# Patient Record
Sex: Male | Born: 1940
Health system: Southern US, Community
[De-identification: ages and names within clinical notes are randomized; demographics above are authoritative.]

## PROBLEM LIST (undated history)

## (undated) DIAGNOSIS — M1990 Unspecified osteoarthritis, unspecified site: Secondary | ICD-10-CM

## (undated) DIAGNOSIS — T4145XA Adverse effect of unspecified anesthetic, initial encounter: Secondary | ICD-10-CM

## (undated) DIAGNOSIS — H532 Diplopia: Secondary | ICD-10-CM

## (undated) DIAGNOSIS — N400 Enlarged prostate without lower urinary tract symptoms: Secondary | ICD-10-CM

## (undated) DIAGNOSIS — R310 Gross hematuria: Secondary | ICD-10-CM

## (undated) DIAGNOSIS — D6949 Other primary thrombocytopenia: Secondary | ICD-10-CM

## (undated) DIAGNOSIS — C4491 Basal cell carcinoma of skin, unspecified: Secondary | ICD-10-CM

## (undated) DIAGNOSIS — T8859XA Other complications of anesthesia, initial encounter: Secondary | ICD-10-CM

## (undated) DIAGNOSIS — E785 Hyperlipidemia, unspecified: Secondary | ICD-10-CM

## (undated) DIAGNOSIS — K625 Hemorrhage of anus and rectum: Secondary | ICD-10-CM

## (undated) DIAGNOSIS — K8020 Calculus of gallbladder without cholecystitis without obstruction: Secondary | ICD-10-CM

## (undated) HISTORY — DX: Calculus of gallbladder without cholecystitis without obstruction: K80.20

## (undated) HISTORY — DX: Hyperlipidemia, unspecified: E78.5

## (undated) HISTORY — DX: Gross hematuria: R31.0

## (undated) HISTORY — PX: CYSTOSCOPY: SUR368

---

## 2002-11-05 HISTORY — PX: COLONOSCOPY: SHX174

## 2004-12-25 ENCOUNTER — Ambulatory Visit: Payer: Self-pay | Admitting: Family Medicine

## 2005-01-21 ENCOUNTER — Ambulatory Visit: Payer: Self-pay | Admitting: Family Medicine

## 2005-01-22 ENCOUNTER — Encounter: Admission: RE | Admit: 2005-01-22 | Discharge: 2005-01-22 | Payer: Self-pay | Admitting: Family Medicine

## 2005-01-27 ENCOUNTER — Ambulatory Visit: Payer: Self-pay

## 2005-01-27 ENCOUNTER — Ambulatory Visit: Payer: Self-pay | Admitting: Family Medicine

## 2005-04-09 ENCOUNTER — Ambulatory Visit: Payer: Self-pay | Admitting: Family Medicine

## 2005-07-13 ENCOUNTER — Ambulatory Visit: Payer: Self-pay | Admitting: Family Medicine

## 2006-09-21 ENCOUNTER — Ambulatory Visit: Payer: Self-pay | Admitting: Family Medicine

## 2007-04-21 ENCOUNTER — Ambulatory Visit: Payer: Self-pay | Admitting: Internal Medicine

## 2007-07-25 ENCOUNTER — Ambulatory Visit: Payer: Self-pay | Admitting: Family Medicine

## 2007-07-25 DIAGNOSIS — R29898 Other symptoms and signs involving the musculoskeletal system: Secondary | ICD-10-CM | POA: Insufficient documentation

## 2007-07-25 DIAGNOSIS — E785 Hyperlipidemia, unspecified: Secondary | ICD-10-CM

## 2007-07-25 DIAGNOSIS — Z8669 Personal history of other diseases of the nervous system and sense organs: Secondary | ICD-10-CM | POA: Insufficient documentation

## 2007-07-25 DIAGNOSIS — H6120 Impacted cerumen, unspecified ear: Secondary | ICD-10-CM | POA: Insufficient documentation

## 2008-07-16 ENCOUNTER — Ambulatory Visit: Payer: Self-pay | Admitting: Family Medicine

## 2008-08-19 ENCOUNTER — Ambulatory Visit: Payer: Self-pay | Admitting: Family Medicine

## 2008-08-19 DIAGNOSIS — S2341XA Sprain of ribs, initial encounter: Secondary | ICD-10-CM

## 2008-11-28 ENCOUNTER — Ambulatory Visit: Payer: Self-pay | Admitting: Family Medicine

## 2008-11-28 ENCOUNTER — Telehealth: Payer: Self-pay | Admitting: Family Medicine

## 2008-11-28 DIAGNOSIS — J019 Acute sinusitis, unspecified: Secondary | ICD-10-CM

## 2009-11-04 ENCOUNTER — Ambulatory Visit: Payer: Self-pay | Admitting: Family Medicine

## 2010-11-03 NOTE — Assessment & Plan Note (Signed)
Summary: ear flush both ears/cjr   Vital Signs:  Patient profile:   69 year old male Height:      69 inches Weight:      192 pounds BMI:     28.46 Temp:     98.2 degrees F oral BP sitting:   124 / 88  (left arm) Cuff size:   large  Vitals Entered By: Alfred Levins, CMA (November 04, 2009 1:18 PM) CC: ear clogged   History of Present Illness: Here to have his ears cleaned out again. he has decreased hearing on both sides, no pain.   Current Medications (verified): 1)  None  Allergies (verified): 1)  ! Penicillin  Past History:  Past Medical History: Reviewed history from 07/25/2007 and no changes required. Hyperlipidemia  Review of Systems  The patient denies anorexia, fever, weight loss, weight gain, vision loss, hoarseness, chest pain, syncope, dyspnea on exertion, peripheral edema, prolonged cough, headaches, hemoptysis, abdominal pain, melena, hematochezia, severe indigestion/heartburn, hematuria, incontinence, genital sores, muscle weakness, suspicious skin lesions, transient blindness, difficulty walking, depression, unusual weight change, abnormal bleeding, enlarged lymph nodes, angioedema, breast masses, and testicular masses.    Physical Exam  General:  Well-developed,well-nourished,in no acute distress; alert,appropriate and cooperative throughout examination Ears:  both canals impacted with cerumen   Impression & Recommendations:  Problem # 1:  CERUMEN IMPACTION (ICD-380.4)  Patient Instructions: 1)  the right ear was irrigated clear with water, and the left ear canal was cleared using a speculum.  2)  Please schedule a follow-up appointment as needed .

## 2010-12-25 ENCOUNTER — Encounter: Payer: Self-pay | Admitting: Family Medicine

## 2010-12-28 ENCOUNTER — Encounter: Payer: Self-pay | Admitting: Family Medicine

## 2010-12-28 ENCOUNTER — Ambulatory Visit (INDEPENDENT_AMBULATORY_CARE_PROVIDER_SITE_OTHER): Payer: Medicare Other | Admitting: Family Medicine

## 2010-12-28 VITALS — BP 120/82 | HR 66 | Temp 98.7°F | Wt 188.0 lb

## 2010-12-28 DIAGNOSIS — H6120 Impacted cerumen, unspecified ear: Secondary | ICD-10-CM

## 2010-12-28 NOTE — Progress Notes (Signed)
  Subjective:    Patient ID: Peter [place], male    DOB: [birth year], 70 y.o.   MRN: [id number]  HPI Here for several weeks of decreased bilateral hearing. No pain. He has to have his ears cleaned out about once a year.    Review of Systems  Constitutional: Negative.   HENT: Positive for hearing loss. Negative for ear pain, congestion, tinnitus and ear discharge.        Objective:   Physical Exam  Constitutional: He appears well-developed and well-nourished.  HENT:       Both ear canals are full of cerumen          Assessment & Plan:  Both canals were irrigated clear with water.

## 2011-01-14 ENCOUNTER — Other Ambulatory Visit (INDEPENDENT_AMBULATORY_CARE_PROVIDER_SITE_OTHER): Payer: Medicare Other

## 2011-01-14 DIAGNOSIS — Z0000 Encounter for general adult medical examination without abnormal findings: Secondary | ICD-10-CM

## 2011-01-14 DIAGNOSIS — E785 Hyperlipidemia, unspecified: Secondary | ICD-10-CM

## 2011-01-14 DIAGNOSIS — Z125 Encounter for screening for malignant neoplasm of prostate: Secondary | ICD-10-CM

## 2011-01-14 LAB — POCT URINALYSIS DIPSTICK
Glucose, UA: NEGATIVE
Leukocytes, UA: NEGATIVE
Nitrite, UA: NEGATIVE
Protein, UA: NEGATIVE
Spec Grav, UA: 1.015

## 2011-01-14 LAB — CBC WITH DIFFERENTIAL/PLATELET
Eosinophils Absolute: 0.1 10*3/uL (ref 0.0–0.7)
Eosinophils Relative: 2 % (ref 0.0–5.0)
Hemoglobin: 13.9 g/dL (ref 13.0–17.0)
Lymphs Abs: 2 10*3/uL (ref 0.7–4.0)
MCHC: 34.2 g/dL (ref 30.0–36.0)
Monocytes Relative: 9.6 % (ref 3.0–12.0)
Neutro Abs: 2.6 10*3/uL (ref 1.4–7.7)
WBC: 5.2 10*3/uL (ref 4.5–10.5)

## 2011-01-14 LAB — BASIC METABOLIC PANEL
CO2: 28 mEq/L (ref 19–32)
Calcium: 8.8 mg/dL (ref 8.4–10.5)
Creatinine, Ser: 1.1 mg/dL (ref 0.4–1.5)
Potassium: 4.5 mEq/L (ref 3.5–5.1)
Sodium: 137 mEq/L (ref 135–145)

## 2011-01-14 LAB — HEPATIC FUNCTION PANEL
ALT: 16 U/L (ref 0–53)
AST: 18 U/L (ref 0–37)
Albumin: 3.9 g/dL (ref 3.5–5.2)
Bilirubin, Direct: 0.1 mg/dL (ref 0.0–0.3)
Total Protein: 6.7 g/dL (ref 6.0–8.3)

## 2011-01-14 LAB — LDL CHOLESTEROL, DIRECT: Direct LDL: 181.1 mg/dL

## 2011-01-14 LAB — LIPID PANEL
Cholesterol: 252 mg/dL — ABNORMAL HIGH (ref 0–200)
Total CHOL/HDL Ratio: 5
VLDL: 14.4 mg/dL (ref 0.0–40.0)

## 2011-01-19 ENCOUNTER — Telehealth: Payer: Self-pay

## 2011-01-19 NOTE — Telephone Encounter (Signed)
Message copied by Madison Hickman on Tue Jan 19, 2011  8:29 AM ------      Message from: Dwaine Deter      Created: Tue Jan 19, 2011  6:14 AM       Normal except his cholesterol is very high. We will discuss at the cpx

## 2011-01-19 NOTE — Telephone Encounter (Signed)
Pt aware.

## 2011-01-21 ENCOUNTER — Encounter: Payer: Self-pay | Admitting: Family Medicine

## 2011-01-21 ENCOUNTER — Ambulatory Visit (INDEPENDENT_AMBULATORY_CARE_PROVIDER_SITE_OTHER): Payer: Medicare Other | Admitting: Family Medicine

## 2011-01-21 VITALS — BP 130/80 | Ht 67.0 in | Wt 186.0 lb

## 2011-01-21 DIAGNOSIS — Z0000 Encounter for general adult medical examination without abnormal findings: Secondary | ICD-10-CM

## 2011-01-21 MED ORDER — EZETIMIBE 10 MG PO TABS: 10.0000 mg | ORAL_TABLET | Freq: Every day | ORAL | Status: DC

## 2011-01-21 NOTE — Progress Notes (Signed)
  Subjective:    Patient ID: Peter [place], male    DOB: 03/04/1941, 70 y.o.   MRN: [id number]  HPI 70 yr old male for a cpx. He feels fine and has no complaints.    Review of Systems  Constitutional: Negative.   HENT: Negative.   Eyes: Negative.   Respiratory: Negative.   Cardiovascular: Negative.   Gastrointestinal: Negative.   Genitourinary: Negative.   Musculoskeletal: Negative.   Skin: Negative.   Neurological: Negative.   Hematological: Negative.   Psychiatric/Behavioral: Negative.        Objective:   Physical Exam  Constitutional: He is oriented to person, place, and time. He appears well-developed and well-nourished. No distress.  HENT:  Head: Normocephalic and atraumatic.  Right Ear: External ear normal.  Left Ear: External ear normal.  Nose: Nose normal.  Mouth/Throat: Oropharynx is clear and moist. No oropharyngeal exudate.  Eyes: Conjunctivae and EOM are normal. Pupils are equal, round, and reactive to light. Right eye exhibits no discharge. Left eye exhibits no discharge. No scleral icterus.  Neck: Neck supple. No JVD present. No tracheal deviation present. No thyromegaly present.  Cardiovascular: Normal rate, regular rhythm, normal heart sounds and intact distal pulses.  Exam reveals no gallop and no friction rub.   No murmur heard.      EKG normal   Pulmonary/Chest: Effort normal and breath sounds normal. No respiratory distress. He has no wheezes. He has no rales. He exhibits no tenderness.  Abdominal: Soft. Bowel sounds are normal. He exhibits no distension and no mass. There is no tenderness. There is no rebound and no guarding.  Genitourinary: Rectum normal, prostate normal and penis normal. Guaiac negative stool. No penile tenderness.  Musculoskeletal: Normal range of motion. He exhibits no edema and no tenderness.  Lymphadenopathy:    He has no cervical adenopathy.  Neurological: He is alert and oriented to person, place, and time. He has normal  reflexes. No cranial nerve deficit. He exhibits normal muscle tone. Coordination normal.  Skin: Skin is warm and dry. No rash noted. He is not diaphoretic. No erythema. No pallor.  Psychiatric: He has a normal mood and affect. His behavior is normal. Judgment and thought content normal.          Assessment & Plan:  We discussed his diet and the nedd to get the LDL down. Try Zetia and recheck labs in 90 days. Set up a colonoscopy.

## 2011-03-04 ENCOUNTER — Ambulatory Visit (AMBULATORY_SURGERY_CENTER): Payer: Medicare Other | Admitting: *Deleted

## 2011-03-04 ENCOUNTER — Telehealth: Payer: Self-pay | Admitting: *Deleted

## 2011-03-04 VITALS — Ht 69.0 in | Wt 186.8 lb

## 2011-03-04 DIAGNOSIS — Z1211 Encounter for screening for malignant neoplasm of colon: Secondary | ICD-10-CM

## 2011-03-04 MED ORDER — PEG-KCL-NACL-NASULF-NA ASC-C 100 G PO SOLR: ORAL | Status: DC

## 2011-03-04 NOTE — Telephone Encounter (Signed)
Pt thinks he had colonoscopy in 2004. Says it was normal. Pt is not having problems and no family history of colon CA.  Cannot remember MD's name who performed procedure or where it was performed.  Only remembers it was in [place], [place].  He will check his records at home and call back w/ Information.  Pt will ask to speak to Sophia or Sheri. Release of information signed for when pt calls back w/ MD's name and given to Sophia. Kathy Smith  

## 2011-03-04 NOTE — Progress Notes (Signed)
Pt thinks he had colonoscopy in 2004. Says it was normal. Pt is not having problems and no family history of colon CA.  Cannot remember MD's name who performed procedure or where it was performed.  Only remembers it was in [place], [place].  He will check his records at home and call back w/ Information.  Pt will ask to speak to [facility] or Sheri. Release of information signed for when pt calls back w/ MD's name and given to [facility]. Ezra Sites

## 2011-03-04 NOTE — Telephone Encounter (Signed)
Take with patient and information obtained. Records release faxed to Dr. Jodi Marble [facility], [place]).

## 2011-03-11 ENCOUNTER — Telehealth: Payer: Self-pay | Admitting: Internal Medicine

## 2011-03-11 NOTE — Telephone Encounter (Signed)
Patient called wanting to know if he still needs the Colonoscopy on Wed. Patient was told that Dr. Leone Payor need to review records first. Records release re-faxed to Dr. Jodi Marble for records. Patient was told that I will call him before Wed.

## 2011-03-15 ENCOUNTER — Telehealth: Payer: Self-pay | Admitting: Gastroenterology

## 2011-03-15 ENCOUNTER — Encounter: Payer: Self-pay | Admitting: Internal Medicine

## 2011-03-15 NOTE — Telephone Encounter (Signed)
Per Dr. Leone Payor, patient was told that his records were reviewed and that he was not due for a recall Colonoscopy until February 2014, since he had polyps but was not pre-cancerous. Colonoscopy for March 18, 2011 was cancelled and recall put in for Fed 2014.

## 2011-03-18 ENCOUNTER — Other Ambulatory Visit: Payer: Medicare Other | Admitting: Internal Medicine

## 2011-05-18 ENCOUNTER — Other Ambulatory Visit (INDEPENDENT_AMBULATORY_CARE_PROVIDER_SITE_OTHER): Payer: Medicare Other

## 2011-05-18 DIAGNOSIS — E785 Hyperlipidemia, unspecified: Secondary | ICD-10-CM

## 2011-05-18 LAB — LIPID PANEL
Cholesterol: 194 mg/dL (ref 0–200)
LDL Cholesterol: 121 mg/dL — ABNORMAL HIGH (ref 0–99)
Total CHOL/HDL Ratio: 3
Triglycerides: 63 mg/dL (ref 0.0–149.0)

## 2011-05-20 NOTE — Progress Notes (Signed)
Spoke with pt and gave results. 

## 2011-07-05 ENCOUNTER — Ambulatory Visit (INDEPENDENT_AMBULATORY_CARE_PROVIDER_SITE_OTHER): Payer: Medicare Other | Admitting: Family Medicine

## 2011-07-05 ENCOUNTER — Encounter: Payer: Self-pay | Admitting: Family Medicine

## 2011-07-05 VITALS — BP 120/80 | HR 73 | Temp 98.6°F | Wt 189.0 lb

## 2011-07-05 DIAGNOSIS — J329 Chronic sinusitis, unspecified: Secondary | ICD-10-CM

## 2011-07-05 DIAGNOSIS — J3489 Other specified disorders of nose and nasal sinuses: Secondary | ICD-10-CM

## 2011-07-05 DIAGNOSIS — R05 Cough: Secondary | ICD-10-CM

## 2011-07-05 MED ORDER — AZITHROMYCIN 250 MG PO TABS: ORAL_TABLET | ORAL | Status: AC

## 2011-07-05 NOTE — Progress Notes (Signed)
  Subjective:    Patient ID: Peter [place], male    DOB: April 26, 1941, 70 y.o.   MRN: [id number]  HPI Here for 2 weeks of intermittent fevers, sinus pressure, HAS, PND, and ST. No cough.   Review of Systems  Constitutional: Negative.   HENT: Positive for congestion, postnasal drip and sinus pressure.   Eyes: Negative.   Respiratory: Positive for cough.        Objective:   Physical Exam  Constitutional: He appears well-developed and well-nourished.  HENT:  Right Ear: External ear normal.  Left Ear: External ear normal.  Nose: Nose normal.  Mouth/Throat: Oropharynx is clear and moist. No oropharyngeal exudate.  Eyes: Conjunctivae are normal. Pupils are equal, round, and reactive to light.  Neck: No thyromegaly present.  Pulmonary/Chest: Effort normal and breath sounds normal.  Lymphadenopathy:    He has no cervical adenopathy.          Assessment & Plan:  Add Mucinex.

## 2011-10-26 DIAGNOSIS — H524 Presbyopia: Secondary | ICD-10-CM | POA: Diagnosis not present

## 2011-10-26 DIAGNOSIS — H2510 Age-related nuclear cataract, unspecified eye: Secondary | ICD-10-CM | POA: Diagnosis not present

## 2012-01-10 ENCOUNTER — Encounter: Payer: Self-pay | Admitting: Family Medicine

## 2012-01-10 ENCOUNTER — Ambulatory Visit (INDEPENDENT_AMBULATORY_CARE_PROVIDER_SITE_OTHER): Payer: Medicare Other | Admitting: Family Medicine

## 2012-01-10 VITALS — BP 128/74 | HR 72 | Temp 98.1°F | Wt 191.0 lb

## 2012-01-10 DIAGNOSIS — E785 Hyperlipidemia, unspecified: Secondary | ICD-10-CM | POA: Diagnosis not present

## 2012-01-10 DIAGNOSIS — H6120 Impacted cerumen, unspecified ear: Secondary | ICD-10-CM | POA: Diagnosis not present

## 2012-01-10 NOTE — Progress Notes (Signed)
  Subjective:    Patient ID: Peter [place], male    DOB: [birth year], 71 y.o.   MRN: [id number]  HPI Here for both ears feeling stopped up. No pain. He has to get them flushed out about once a year. Also he stopped taking Zetia 2 months ago because of diffuse joint pains. Within 2 weeks the joint pains resolved, so he wants to stay off the med.    Review of Systems  Constitutional: Negative.   HENT: Positive for hearing loss. Negative for ear pain and tinnitus.   Musculoskeletal: Positive for arthralgias.       Objective:   Physical Exam  Constitutional: He appears well-developed and well-nourished.  HENT:  Nose: Nose normal.  Mouth/Throat: Oropharynx is clear and moist. No oropharyngeal exudate.       Both ear canals are full of cerumen   Neck: No thyromegaly present.  Pulmonary/Chest: Effort normal and breath sounds normal.  Lymphadenopathy:    He has no cervical adenopathy.          Assessment & Plan:  Both ears are irrigated clear with water. We will stay off Zetia and check a lipid level in another month

## 2012-07-28 ENCOUNTER — Encounter: Payer: Self-pay | Admitting: Family Medicine

## 2012-07-28 ENCOUNTER — Ambulatory Visit (INDEPENDENT_AMBULATORY_CARE_PROVIDER_SITE_OTHER): Payer: Medicare Other | Admitting: Family Medicine

## 2012-07-28 VITALS — BP 120/70 | Temp 98.2°F | Wt 187.0 lb

## 2012-07-28 DIAGNOSIS — H6120 Impacted cerumen, unspecified ear: Secondary | ICD-10-CM | POA: Diagnosis not present

## 2012-07-28 DIAGNOSIS — J329 Chronic sinusitis, unspecified: Secondary | ICD-10-CM | POA: Diagnosis not present

## 2012-07-28 MED ORDER — AZITHROMYCIN 250 MG PO TABS: ORAL_TABLET | ORAL | Status: DC

## 2012-07-28 NOTE — Addendum Note (Signed)
Addended by: Gershon Crane A on: 07/28/2012 05:50 PM   Modules accepted: Orders

## 2012-07-28 NOTE — Progress Notes (Signed)
  Subjective:    Patient ID: Peter [place], male    DOB: [birth year], 71 y.o.   MRN: [id number]  HPI Here for one week of stuffy head, PND, ST, and a dry cough. No fever. He has also noticed some decreased hearing for awhile.    Review of Systems  Constitutional: Negative.   HENT: Positive for hearing loss, congestion and postnasal drip. Negative for ear pain.   Eyes: Negative.   Respiratory: Positive for cough.        Objective:   Physical Exam  Constitutional: He appears well-nourished.  HENT:  Nose: Nose normal.  Mouth/Throat: Oropharynx is clear and moist. No oropharyngeal exudate.       Both ears are full of cerumen   Eyes: Conjunctivae normal are normal.  Pulmonary/Chest: Effort normal and breath sounds normal.  Lymphadenopathy:    He has no cervical adenopathy.          Assessment & Plan:  Given a Zpack. The ears were irrigated clear with water.

## 2012-07-28 NOTE — Progress Notes (Signed)
  Subjective:    Patient ID: Peter [place], male    DOB: [birth year], 71 y.o.   MRN: [id number]  HPI    Review of Systems     Objective:   Physical Exam        Assessment & Plan:  Actually after repeated attempts to clear his ears, we were unsuccessful at this. We will refer him to ENT for the cerumen impactions.

## 2012-08-03 DIAGNOSIS — H902 Conductive hearing loss, unspecified: Secondary | ICD-10-CM | POA: Diagnosis not present

## 2012-08-03 DIAGNOSIS — H6120 Impacted cerumen, unspecified ear: Secondary | ICD-10-CM | POA: Diagnosis not present

## 2012-08-07 ENCOUNTER — Telehealth: Payer: Self-pay | Admitting: Family Medicine

## 2012-08-07 DIAGNOSIS — Z23 Encounter for immunization: Secondary | ICD-10-CM | POA: Diagnosis not present

## 2012-08-07 DIAGNOSIS — S61409A Unspecified open wound of unspecified hand, initial encounter: Secondary | ICD-10-CM | POA: Diagnosis not present

## 2012-08-07 NOTE — Telephone Encounter (Signed)
Pt notified per Dr. Claris Che instructions to report to an urgent care facility for reported injury.

## 2012-08-07 NOTE — Telephone Encounter (Signed)
Caller: Edouard/Patient; Patient Name: Peter [place]; PCP: Gershon Crane [facility], [place]); Best Callback Phone Number: [phone]; Reason for call: Cuts, Scrapes, Abrasions Pt cut finger at 1015 with an Exacto knife, between thumb and forefinger of right hand. Bleeding stopped easily (less than 10 min) but there is a 1 1/2 inch laceration that "gapes open with movement". Emergent sx of Abreasions, Lacerations, Puncture Wounds: Wound is non ro near a joint AND gapes when joint is bent. Spoke with Doy Hutching; to consult Dr. Abran Cantor and call pt back, asap.

## 2012-09-14 ENCOUNTER — Ambulatory Visit (INDEPENDENT_AMBULATORY_CARE_PROVIDER_SITE_OTHER): Payer: Medicare Other | Admitting: Internal Medicine

## 2012-09-14 ENCOUNTER — Encounter: Payer: Self-pay | Admitting: Internal Medicine

## 2012-09-14 VITALS — BP 140/88 | Temp 98.1°F | Wt 185.0 lb

## 2012-09-14 DIAGNOSIS — B029 Zoster without complications: Secondary | ICD-10-CM

## 2012-09-14 MED ORDER — VALACYCLOVIR HCL 1 G PO TABS: 1000.0000 mg | ORAL_TABLET | Freq: Two times a day (BID) | ORAL | Status: DC

## 2012-09-14 NOTE — Assessment & Plan Note (Signed)
71 year old white male with new onset rash on left flank/abdomen consistent with herpes zoster. Treat with Valtrex 1000 mg twice daily for 10 days. Follow up with primary care physician within 2 weeks. Patient advised to call office if he develops worsening rash or significant pain.

## 2012-09-14 NOTE — Patient Instructions (Addendum)
Please follow up with Dr. Clent Ridges within 2 weeks Call our office if your rash gets worse or you develop significant pain

## 2012-09-14 NOTE — Progress Notes (Signed)
  Subjective:    Patient ID: Peter [place], male    DOB: January 23, 1941, 71 y.o.   MRN: [id number]  HPI  71 year old white male with history of hyperlipidemia complains of new onset rash that he noticed this morning. His rash is located on his left side of his abdomen/flank. He "knows rash is there" but denies any severe pain or burning sensation.  Patient noted to have chickenpox as a child. Review of Systems Negative for fever or chills  Past Medical History  Diagnosis Date  . Hyperlipidemia     History   Social History  . Marital Status: Married    Spouse Name: N/A    Number of Children: N/A  . Years of Education: N/A   Occupational History  . Not on file.   Social History Main Topics  . Smoking status: Never Smoker   . Smokeless tobacco: Never Used  . Alcohol Use: No  . Drug Use: No  . Sexually Active: Not on file   Other Topics Concern  . Not on file   Social History Narrative  . No narrative on file    Past Surgical History  Procedure Date  . Colonoscopy 11/05/2002    Internal hemorrhoids, otherwise normal (diminutive hyperplastic polyp - Dr. Yaakov Guthrie, [place]    Family History  Problem Relation Age of Onset  . Ovarian cancer    . Heart disease      Allergies  Allergen Reactions  . Penicillins     REACTION: anaphylaxis    Current Outpatient Prescriptions on File Prior to Visit  Medication Sig Dispense Refill  . Multiple Vitamin (MULTIVITAMIN) tablet Take 1 tablet by mouth daily.          BP 140/88  Temp 98.1 F (36.7 C) (Oral)  Wt 185 lb (83.915 kg)       Objective:   Physical Exam  Constitutional: He is oriented to person, place, and time. He appears well-developed and well-nourished.  Cardiovascular: Normal rate, regular rhythm and normal heart sounds.   Pulmonary/Chest: Effort normal and breath sounds normal. He has no wheezes.  Neurological: He is alert and oriented to person, place, and time.  Skin:       Vesicular erythematous eruption  from left back, flank and  abdomen  Psychiatric: He has a normal mood and affect. His behavior is normal.          Assessment & Plan:

## 2012-09-28 ENCOUNTER — Ambulatory Visit (INDEPENDENT_AMBULATORY_CARE_PROVIDER_SITE_OTHER): Payer: Medicare Other | Admitting: Family Medicine

## 2012-09-28 ENCOUNTER — Encounter: Payer: Self-pay | Admitting: Family Medicine

## 2012-09-28 VITALS — BP 118/72 | HR 75 | Temp 98.0°F | Ht 69.0 in | Wt 186.0 lb

## 2012-09-28 DIAGNOSIS — B029 Zoster without complications: Secondary | ICD-10-CM

## 2012-09-28 NOTE — Progress Notes (Signed)
  Subjective:    Patient ID: Peter [place], male    DOB: [birth year], 71 y.o.   MRN: [id number]  HPI Here to follow up on shingles on the left flank. He was seen on 09-14-12 for this and he took a course of Valtrex. Now he feels much better with some residual burning pains. The rash is fading away.    Review of Systems  Constitutional: Negative.   Respiratory: Negative.   Cardiovascular: Negative.        Objective:   Physical Exam  Constitutional: He appears well-developed and well-nourished.  Cardiovascular: Normal rate, regular rhythm, normal heart sounds and intact distal pulses.   Pulmonary/Chest: Effort normal and breath sounds normal.  Skin:       Fading rash on the left flank from the middle back around to the sternum           Assessment & Plan:  Resolving shingles. We will plan on giving him the Zostavax shot in 3 months

## 2012-11-20 ENCOUNTER — Encounter: Payer: Self-pay | Admitting: Internal Medicine

## 2013-01-25 DIAGNOSIS — H902 Conductive hearing loss, unspecified: Secondary | ICD-10-CM | POA: Diagnosis not present

## 2013-01-25 DIAGNOSIS — H6120 Impacted cerumen, unspecified ear: Secondary | ICD-10-CM | POA: Diagnosis not present

## 2013-06-25 ENCOUNTER — Encounter: Payer: Self-pay | Admitting: Internal Medicine

## 2013-10-30 DIAGNOSIS — H6120 Impacted cerumen, unspecified ear: Secondary | ICD-10-CM | POA: Diagnosis not present

## 2013-10-30 DIAGNOSIS — H902 Conductive hearing loss, unspecified: Secondary | ICD-10-CM | POA: Diagnosis not present

## 2014-04-24 DIAGNOSIS — T6391XA Toxic effect of contact with unspecified venomous animal, accidental (unintentional), initial encounter: Secondary | ICD-10-CM | POA: Diagnosis not present

## 2014-04-24 DIAGNOSIS — Z880 Allergy status to penicillin: Secondary | ICD-10-CM | POA: Diagnosis not present

## 2014-04-24 DIAGNOSIS — T7840XA Allergy, unspecified, initial encounter: Secondary | ICD-10-CM | POA: Diagnosis not present

## 2014-04-26 ENCOUNTER — Ambulatory Visit (INDEPENDENT_AMBULATORY_CARE_PROVIDER_SITE_OTHER): Payer: Medicare Other | Admitting: Family Medicine

## 2014-04-26 ENCOUNTER — Encounter: Payer: Self-pay | Admitting: Family Medicine

## 2014-04-26 VITALS — BP 135/62 | HR 75 | Temp 98.7°F | Ht 69.0 in | Wt 189.0 lb

## 2014-04-26 DIAGNOSIS — T6591XS Toxic effect of unspecified substance, accidental (unintentional), sequela: Secondary | ICD-10-CM | POA: Diagnosis not present

## 2014-04-26 DIAGNOSIS — L509 Urticaria, unspecified: Secondary | ICD-10-CM

## 2014-04-26 DIAGNOSIS — T63443S Toxic effect of venom of bees, assault, sequela: Secondary | ICD-10-CM

## 2014-04-26 NOTE — Progress Notes (Signed)
Pre visit review using our clinic review tool, if applicable. No additional management support is needed unless otherwise documented below in the visit note. 

## 2014-04-26 NOTE — Progress Notes (Signed)
   Subjective:    Patient ID: Peter [place], male    DOB: [birth year], 73 y.o.   MRN: [id number]  HPI Here for hives that he thinks are a reaction to prednisone. He has taken this before with no problem. On 04-24-14 while working in his yard he was stung multiple times by yellow jackets on the arms and legs. This caused him to pass out twice at home but there was no tongue swelling, throat closing, SOB etc. His wife took him to the ER where he received IV Benadryl among other things. They put him on a prednisone taper and he took this up until this morning. Last night he developed itchy hives all over his body. Still no SOB etc.    Review of Systems  Constitutional: Negative.   HENT: Negative.   Respiratory: Negative.   Cardiovascular: Negative.   Skin: Positive for rash.  Neurological: Negative.        Objective:   Physical Exam  Constitutional: He is oriented to person, place, and time. He appears well-developed and well-nourished. No distress.  Cardiovascular: Normal rate, regular rhythm, normal heart sounds and intact distal pulses.   Pulmonary/Chest: Effort normal and breath sounds normal.  Neurological: He is alert and oriented to person, place, and time.  Skin:  Diffuse urticaria over the body           Assessment & Plan:  This does seem to be a reaction to the prednisone so he will stay of this. Take Zyrtec 10 mg bid and Zantac 150 mg bid until the hives clear. Recheck prn

## 2014-07-04 DIAGNOSIS — L821 Other seborrheic keratosis: Secondary | ICD-10-CM | POA: Diagnosis not present

## 2014-07-04 DIAGNOSIS — D1801 Hemangioma of skin and subcutaneous tissue: Secondary | ICD-10-CM | POA: Diagnosis not present

## 2014-07-04 DIAGNOSIS — C44519 Basal cell carcinoma of skin of other part of trunk: Secondary | ICD-10-CM | POA: Diagnosis not present

## 2014-07-04 DIAGNOSIS — D492 Neoplasm of unspecified behavior of bone, soft tissue, and skin: Secondary | ICD-10-CM | POA: Diagnosis not present

## 2014-07-23 DIAGNOSIS — C44519 Basal cell carcinoma of skin of other part of trunk: Secondary | ICD-10-CM | POA: Diagnosis not present

## 2014-08-15 DIAGNOSIS — H6123 Impacted cerumen, bilateral: Secondary | ICD-10-CM | POA: Diagnosis not present

## 2014-08-15 DIAGNOSIS — H6120 Impacted cerumen, unspecified ear: Secondary | ICD-10-CM | POA: Diagnosis not present

## 2014-08-15 DIAGNOSIS — H902 Conductive hearing loss, unspecified: Secondary | ICD-10-CM | POA: Diagnosis not present

## 2014-10-15 DIAGNOSIS — L821 Other seborrheic keratosis: Secondary | ICD-10-CM | POA: Diagnosis not present

## 2014-10-31 DIAGNOSIS — H01023 Squamous blepharitis right eye, unspecified eyelid: Secondary | ICD-10-CM | POA: Diagnosis not present

## 2014-10-31 DIAGNOSIS — H2513 Age-related nuclear cataract, bilateral: Secondary | ICD-10-CM | POA: Diagnosis not present

## 2014-10-31 DIAGNOSIS — H01029 Squamous blepharitis unspecified eye, unspecified eyelid: Secondary | ICD-10-CM | POA: Diagnosis not present

## 2015-01-08 ENCOUNTER — Encounter: Payer: Self-pay | Admitting: Family Medicine

## 2015-01-08 ENCOUNTER — Ambulatory Visit (INDEPENDENT_AMBULATORY_CARE_PROVIDER_SITE_OTHER): Payer: Medicare Other | Admitting: Family Medicine

## 2015-01-08 VITALS — BP 141/80 | HR 69 | Temp 98.8°F | Ht 69.0 in | Wt 188.0 lb

## 2015-01-08 DIAGNOSIS — J0100 Acute maxillary sinusitis, unspecified: Secondary | ICD-10-CM | POA: Diagnosis not present

## 2015-01-08 MED ORDER — AZITHROMYCIN 250 MG PO TABS: ORAL_TABLET | ORAL | Status: DC

## 2015-01-08 NOTE — Progress Notes (Signed)
Pre visit review using our clinic review tool, if applicable. No additional management support is needed unless otherwise documented below in the visit note. 

## 2015-01-08 NOTE — Progress Notes (Signed)
   Subjective:    Patient ID: Peter [place], male    DOB: July 22, 1941, 74 y.o.   MRN: [id number]  HPI Here for one week of sinus pressure, PND, ST, and a dry cough.   Review of Systems  Constitutional: Negative.   HENT: Positive for congestion, postnasal drip and sinus pressure.   Eyes: Negative.   Respiratory: Positive for cough.        Objective:   Physical Exam  Constitutional: He appears well-developed and well-nourished.  HENT:  Right Ear: External ear normal.  Left Ear: External ear normal.  Nose: Nose normal.  Mouth/Throat: Oropharynx is clear and moist.  Eyes: Conjunctivae are normal.  Pulmonary/Chest: Effort normal and breath sounds normal.  Lymphadenopathy:    He has no cervical adenopathy.          Assessment & Plan:  Add Mucinex

## 2015-02-19 DIAGNOSIS — H2510 Age-related nuclear cataract, unspecified eye: Secondary | ICD-10-CM | POA: Diagnosis not present

## 2015-02-19 DIAGNOSIS — H01029 Squamous blepharitis unspecified eye, unspecified eyelid: Secondary | ICD-10-CM | POA: Diagnosis not present

## 2015-02-19 DIAGNOSIS — H01023 Squamous blepharitis right eye, unspecified eyelid: Secondary | ICD-10-CM | POA: Diagnosis not present

## 2015-02-19 DIAGNOSIS — H532 Diplopia: Secondary | ICD-10-CM | POA: Diagnosis not present

## 2015-02-21 ENCOUNTER — Telehealth: Payer: Self-pay | Admitting: Family Medicine

## 2015-02-21 NOTE — Telephone Encounter (Signed)
Pt has been scheduled for monday

## 2015-02-21 NOTE — Telephone Encounter (Signed)
Pt saw his eye doctor about his his double vision. Eye doctor suggest a stroke lab panel. Pt would ike a call back - if he is not at home it is ok to leave a message. Would like to have labs drawn Monday if possible.

## 2015-02-21 NOTE — Telephone Encounter (Signed)
He needs an OV for this. I have not evaluated him for this problem

## 2015-02-24 ENCOUNTER — Ambulatory Visit: Payer: BLUE CROSS/BLUE SHIELD | Admitting: Family Medicine

## 2015-02-27 ENCOUNTER — Ambulatory Visit (INDEPENDENT_AMBULATORY_CARE_PROVIDER_SITE_OTHER): Payer: Medicare Other | Admitting: Family Medicine

## 2015-02-27 ENCOUNTER — Encounter: Payer: Self-pay | Admitting: Family Medicine

## 2015-02-27 VITALS — BP 122/69 | HR 73 | Temp 98.7°F | Ht 69.0 in | Wt 190.0 lb

## 2015-02-27 DIAGNOSIS — H532 Diplopia: Secondary | ICD-10-CM | POA: Diagnosis not present

## 2015-02-27 LAB — BASIC METABOLIC PANEL
BUN: 17 mg/dL (ref 6–23)
CO2: 31 meq/L (ref 19–32)
CREATININE: 1.2 mg/dL (ref 0.40–1.50)
Calcium: 9.6 mg/dL (ref 8.4–10.5)
Chloride: 103 mEq/L (ref 96–112)
GFR: 62.52 mL/min (ref >60.00)
Glucose, Bld: 91 mg/dL (ref 70–99)
POTASSIUM: 4.6 meq/L (ref 3.5–5.1)
SODIUM: 138 meq/L (ref 135–145)

## 2015-02-27 LAB — CBC WITH DIFFERENTIAL/PLATELET
BASOS ABS: 0.1 10*3/uL (ref 0.0–0.1)
Basophils Relative: 1 % (ref 0.0–3.0)
EOS ABS: 0.1 10*3/uL (ref 0.0–0.7)
EOS PCT: 1.9 % (ref 0.0–5.0)
HCT: 42.9 % (ref 39.0–52.0)
HEMOGLOBIN: 14.4 g/dL (ref 13.0–17.0)
LYMPHS ABS: 2.6 10*3/uL (ref 0.7–4.0)
LYMPHS PCT: 42.7 % (ref 12.0–46.0)
MCHC: 33.7 g/dL (ref 30.0–36.0)
MCV: 92.8 fl (ref 78.0–100.0)
MONO ABS: 0.7 10*3/uL (ref 0.1–1.0)
MONOS PCT: 11.6 % (ref 3.0–12.0)
Neutro Abs: 2.6 10*3/uL (ref 1.4–7.7)
Neutrophils Relative %: 42.8 % — ABNORMAL LOW (ref 43.0–77.0)
Platelets: 134 10*3/uL — ABNORMAL LOW (ref 150.0–400.0)
RBC: 4.62 Mil/uL (ref 4.22–5.81)
RDW: 14.4 % (ref 11.5–15.5)
WBC: 6.1 10*3/uL (ref 4.0–10.5)

## 2015-02-27 LAB — HEPATIC FUNCTION PANEL
ALBUMIN: 4.5 g/dL (ref 3.5–5.2)
ALT: 14 U/L (ref 0–53)
AST: 18 U/L (ref 0–37)
Alkaline Phosphatase: 53 U/L (ref 39–117)
BILIRUBIN DIRECT: 0.1 mg/dL (ref 0.0–0.3)
TOTAL PROTEIN: 7.5 g/dL (ref 6.0–8.3)
Total Bilirubin: 0.7 mg/dL (ref 0.2–1.2)

## 2015-02-27 LAB — VITAMIN B12: VITAMIN B 12: 318 pg/mL (ref 211–911)

## 2015-02-27 LAB — TSH: TSH: 1.56 u[IU]/mL (ref 0.35–4.50)

## 2015-02-27 NOTE — Progress Notes (Signed)
   Subjective:    Patient ID: Peter [place], male    DOB: [birth year], 74 y.o.   MRN: [id number]  HPI Here to discuss intermittent double vision. For the past few months he has had brief spells of double vision that only last about 30 seconds at a time. He is averaging 2-3 of these a week. They can occur at any time of day. No other sx, no blurred vision, no HA. No other neurologic deficits, no numbness or weakness. He saw his opththalmologist, Dr. Doree Fudge in [place], yesterday for a full eye exam and this was totally normal.    Review of Systems  Constitutional: Negative.   HENT: Negative.   Eyes: Positive for visual disturbance. Negative for pain.  Respiratory: Negative.   Cardiovascular: Negative.   Neurological: Negative.        Objective:   Physical Exam  Constitutional: He is oriented to person, place, and time. He appears well-developed and well-nourished.  HENT:  Head: Normocephalic and atraumatic.  Right Ear: External ear normal.  Left Ear: External ear normal.  Nose: Nose normal.  Mouth/Throat: Oropharynx is clear and moist.  Eyes: Conjunctivae and EOM are normal. Pupils are equal, round, and reactive to light.  Neck: Neck supple. No thyromegaly present.  Cardiovascular: Normal rate, regular rhythm, normal heart sounds and intact distal pulses.   No carotid bruits   Pulmonary/Chest: Effort normal and breath sounds normal.  Lymphadenopathy:    He has no cervical adenopathy.  Neurological: He is alert and oriented to person, place, and time. He has normal reflexes. No cranial nerve deficit. He exhibits normal muscle tone. Coordination normal.          Assessment & Plan:  Intermittent double vision which seems to be related to fatigue of some extraocular muscles. We will screen with a brain MRI and carotid dopplers. Get labs today. If these are unrevealing we will refer him to Neurology

## 2015-02-27 NOTE — Progress Notes (Signed)
Pre visit review using our clinic review tool, if applicable. No additional management support is needed unless otherwise documented below in the visit note. 

## 2015-03-05 ENCOUNTER — Encounter (HOSPITAL_COMMUNITY): Payer: BLUE CROSS/BLUE SHIELD

## 2015-03-11 ENCOUNTER — Ambulatory Visit
Admission: RE | Admit: 2015-03-11 | Discharge: 2015-03-11 | Disposition: A | Discharge status: Home / Self Care | Payer: Medicare Other | Source: Ambulatory Visit | Attending: Family Medicine | Admitting: Family Medicine

## 2015-03-11 DIAGNOSIS — H532 Diplopia: Secondary | ICD-10-CM | POA: Diagnosis not present

## 2015-03-11 MED ORDER — GADOBENATE DIMEGLUMINE 529 MG/ML IV SOLN: 17.0000 mL | Freq: Once | INTRAVENOUS | Status: AC | PRN

## 2015-03-12 ENCOUNTER — Ambulatory Visit (HOSPITAL_COMMUNITY): Payer: Medicare Other | Attending: Cardiology

## 2015-03-12 DIAGNOSIS — I6523 Occlusion and stenosis of bilateral carotid arteries: Secondary | ICD-10-CM | POA: Diagnosis not present

## 2015-03-12 DIAGNOSIS — H532 Diplopia: Secondary | ICD-10-CM | POA: Diagnosis not present

## 2015-04-08 DIAGNOSIS — H900 Conductive hearing loss, bilateral: Secondary | ICD-10-CM | POA: Diagnosis not present

## 2015-04-08 DIAGNOSIS — H6123 Impacted cerumen, bilateral: Secondary | ICD-10-CM | POA: Diagnosis not present

## 2015-06-24 DIAGNOSIS — H2513 Age-related nuclear cataract, bilateral: Secondary | ICD-10-CM | POA: Diagnosis not present

## 2015-06-24 DIAGNOSIS — H532 Diplopia: Secondary | ICD-10-CM | POA: Diagnosis not present

## 2015-06-24 DIAGNOSIS — H524 Presbyopia: Secondary | ICD-10-CM | POA: Diagnosis not present

## 2015-07-08 DIAGNOSIS — H5053 Vertical heterophoria: Secondary | ICD-10-CM | POA: Diagnosis not present

## 2015-07-14 DIAGNOSIS — L570 Actinic keratosis: Secondary | ICD-10-CM | POA: Diagnosis not present

## 2015-07-14 DIAGNOSIS — Z85828 Personal history of other malignant neoplasm of skin: Secondary | ICD-10-CM | POA: Diagnosis not present

## 2015-07-14 DIAGNOSIS — L821 Other seborrheic keratosis: Secondary | ICD-10-CM | POA: Diagnosis not present

## 2015-07-14 DIAGNOSIS — D485 Neoplasm of uncertain behavior of skin: Secondary | ICD-10-CM | POA: Diagnosis not present

## 2015-07-14 DIAGNOSIS — L820 Inflamed seborrheic keratosis: Secondary | ICD-10-CM | POA: Diagnosis not present

## 2015-07-14 DIAGNOSIS — D1801 Hemangioma of skin and subcutaneous tissue: Secondary | ICD-10-CM | POA: Diagnosis not present

## 2015-08-14 ENCOUNTER — Encounter: Payer: Self-pay | Admitting: Family Medicine

## 2015-08-14 ENCOUNTER — Ambulatory Visit (INDEPENDENT_AMBULATORY_CARE_PROVIDER_SITE_OTHER): Payer: Medicare Other | Admitting: Family Medicine

## 2015-08-14 VITALS — BP 164/79 | HR 81 | Temp 99.5°F | Ht 69.0 in | Wt 186.0 lb

## 2015-08-14 DIAGNOSIS — J019 Acute sinusitis, unspecified: Secondary | ICD-10-CM | POA: Diagnosis not present

## 2015-08-14 MED ORDER — AZITHROMYCIN 250 MG PO TABS: ORAL_TABLET | ORAL | Status: DC

## 2015-08-14 NOTE — Progress Notes (Signed)
   Subjective:    Patient ID: Peter [place], male    DOB: 10/02/1941, 74 y.o.   MRN: [id number]  HPI Here for 3 days of sinus pressure, PND, HA, ST, and a dry cough. No fever.    Review of Systems  Constitutional: Negative.   HENT: Positive for congestion, postnasal drip, sinus pressure and sore throat. Negative for ear pain.   Eyes: Negative.   Respiratory: Positive for cough.        Objective:   Physical Exam  Constitutional: He appears well-developed and well-nourished. No distress.  HENT:  Right Ear: External ear normal.  Left Ear: External ear normal.  Nose: Nose normal.  Mouth/Throat: Oropharynx is clear and moist.  Eyes: Conjunctivae are normal.  Neck: No thyromegaly present.  Cardiovascular: Normal rate, regular rhythm, normal heart sounds and intact distal pulses.   Pulmonary/Chest: Effort normal and breath sounds normal.  Lymphadenopathy:    He has no cervical adenopathy.          Assessment & Plan:  Sinusitis, treatr with a Zpack.

## 2015-08-14 NOTE — Progress Notes (Signed)
Pre visit review using our clinic review tool, if applicable. No additional management support is needed unless otherwise documented below in the visit note. 

## 2015-09-04 DIAGNOSIS — N400 Enlarged prostate without lower urinary tract symptoms: Secondary | ICD-10-CM

## 2015-09-04 DIAGNOSIS — R310 Gross hematuria: Secondary | ICD-10-CM

## 2015-09-04 DIAGNOSIS — K8020 Calculus of gallbladder without cholecystitis without obstruction: Secondary | ICD-10-CM

## 2015-09-04 HISTORY — DX: Gross hematuria: R31.0

## 2015-09-04 HISTORY — DX: Calculus of gallbladder without cholecystitis without obstruction: K80.20

## 2015-09-04 HISTORY — DX: Benign prostatic hyperplasia without lower urinary tract symptoms: N40.0

## 2015-09-19 ENCOUNTER — Ambulatory Visit (INDEPENDENT_AMBULATORY_CARE_PROVIDER_SITE_OTHER): Payer: Medicare Other | Admitting: Family Medicine

## 2015-09-19 ENCOUNTER — Encounter: Payer: Self-pay | Admitting: Family Medicine

## 2015-09-19 VITALS — BP 151/81 | HR 75 | Temp 98.1°F | Resp 16 | Ht 69.0 in | Wt 190.0 lb

## 2015-09-19 DIAGNOSIS — R361 Hematospermia: Secondary | ICD-10-CM

## 2015-09-19 DIAGNOSIS — R319 Hematuria, unspecified: Secondary | ICD-10-CM

## 2015-09-19 DIAGNOSIS — N410 Acute prostatitis: Secondary | ICD-10-CM

## 2015-09-19 LAB — POCT URINALYSIS DIPSTICK
BILIRUBIN UA: NEGATIVE
Blood, UA: NEGATIVE
GLUCOSE UA: NEGATIVE
KETONES UA: NEGATIVE
LEUKOCYTES UA: NEGATIVE
Nitrite, UA: NEGATIVE
PROTEIN UA: NEGATIVE
SPEC GRAV UA: 1.01
Urobilinogen, UA: 0.2
pH, UA: 5.5

## 2015-09-19 LAB — PSA: PSA: 1.49 ng/mL (ref 0.10–4.00)

## 2015-09-19 MED ORDER — LEVOFLOXACIN 500 MG PO TABS: 500.0000 mg | ORAL_TABLET | Freq: Every day | ORAL | Status: DC

## 2015-09-19 NOTE — Progress Notes (Signed)
OFFICE VISIT  09/19/2015   CC:  Chief Complaint  Patient presents with  . Hematuria    x 1 day   HPI:    Patient is a 74 y.o. Caucasian male who presents for noting dark brown color of his urine, thick-like per his words, noted at the end of urinating this morning.  Noted blood in his ejaculate weeks ago x 1, and it never happened again.  No dysuria, urinary frequency or urgency, or pain in suprapubic or rectal region.   Took a Z pack about 3 weeks ago for upper resp infection.   Never smoker.  Never worked around [employer].  No hx of kidney stones.  No flank/groin pain. Has mild R lower back pain today.  No hx of abnormal prostate ca screening.  Past Medical History  Diagnosis Date  . Hyperlipidemia     Past Surgical History  Procedure Laterality Date  . Colonoscopy  11/05/2002    Internal hemorrhoids, otherwise normal (diminutive hyperplastic polyp - Dr. Aurelio Jew, [place]   MEDS: ASA 81mg  qd  Allergies  Allergen Reactions  . Penicillins     REACTION: anaphylaxis  . Prednisone     hives    ROS As per HPI  PE: Blood pressure 151/81, pulse 75, temperature 98.1 F (36.7 C), temperature source Oral, resp. rate 16, height 5\' 9"  (1.753 m), weight 190 lb (86.183 kg), SpO2 97 %. Gen: Alert, well appearing.  Patient is oriented to person, place, time, and situation. CV: RRR, no m/r/g.   LUNGS: CTA bilat, nonlabored resps, good aeration in all lung fields. BACK: no CVA tenderness, back tenderness, or flank tenderness.   DRE: pt clinched severely with this exam and I was only able to get my fingertip up to barely touch the prostate gland.  It did seem symmetrically large and pt said it was tender to palpation.  LABS:   CC UA today: NORMAL    Chemistry      Component Value Date/Time   NA 138 02/27/2015 1008   K 4.6 02/27/2015 1008   CL 103 02/27/2015 1008   CO2 31 02/27/2015 1008   BUN 17 02/27/2015 1008   CREATININE 1.20 02/27/2015 1008      Component Value  Date/Time   CALCIUM 9.6 02/27/2015 1008   ALKPHOS 53 02/27/2015 1008   AST 18 02/27/2015 1008   ALT 14 02/27/2015 1008   BILITOT 0.7 02/27/2015 1008     Lab Results  Component Value Date   WBC 6.1 02/27/2015   HGB 14.4 02/27/2015   HCT 42.9 02/27/2015   MCV 92.8 02/27/2015   PLT 134.0* 02/27/2015   IMPRESSION AND PLAN:  1) Gross hematuria x 1 today and relatively recent episode of hematospermia. UA normal in office today. Plan is to treat as prostatitis at this time with levaquin 500 mg qd x 14d and will check PSA. Will also refer to urology for further evaluation.  An After Visit Summary was printed and given to the patient.  FOLLOW UP: Return if symptoms worsen or fail to improve.

## 2015-09-19 NOTE — Progress Notes (Signed)
Pre visit review using our clinic review tool, if applicable. No additional management support is needed unless otherwise documented below in the visit note. 

## 2015-09-24 DIAGNOSIS — K8020 Calculus of gallbladder without cholecystitis without obstruction: Secondary | ICD-10-CM | POA: Diagnosis not present

## 2015-09-24 DIAGNOSIS — N400 Enlarged prostate without lower urinary tract symptoms: Secondary | ICD-10-CM | POA: Diagnosis not present

## 2015-09-24 DIAGNOSIS — R310 Gross hematuria: Secondary | ICD-10-CM | POA: Diagnosis not present

## 2015-09-25 ENCOUNTER — Encounter: Payer: Self-pay | Admitting: Family Medicine

## 2015-10-05 DIAGNOSIS — H532 Diplopia: Secondary | ICD-10-CM

## 2015-10-05 HISTORY — DX: Diplopia: H53.2

## 2015-10-08 DIAGNOSIS — H5053 Vertical heterophoria: Secondary | ICD-10-CM | POA: Diagnosis not present

## 2015-10-15 DIAGNOSIS — M545 Low back pain: Secondary | ICD-10-CM | POA: Diagnosis not present

## 2015-10-15 DIAGNOSIS — R310 Gross hematuria: Secondary | ICD-10-CM | POA: Diagnosis not present

## 2015-10-15 DIAGNOSIS — K8020 Calculus of gallbladder without cholecystitis without obstruction: Secondary | ICD-10-CM | POA: Diagnosis not present

## 2015-10-15 DIAGNOSIS — G8929 Other chronic pain: Secondary | ICD-10-CM | POA: Diagnosis not present

## 2015-11-05 DIAGNOSIS — Z0000 Encounter for general adult medical examination without abnormal findings: Secondary | ICD-10-CM | POA: Diagnosis not present

## 2015-11-05 DIAGNOSIS — R310 Gross hematuria: Secondary | ICD-10-CM | POA: Diagnosis not present

## 2015-11-11 DIAGNOSIS — R361 Hematospermia: Secondary | ICD-10-CM | POA: Diagnosis not present

## 2015-11-11 DIAGNOSIS — Z0000 Encounter for general adult medical examination without abnormal findings: Secondary | ICD-10-CM | POA: Diagnosis not present

## 2015-11-11 DIAGNOSIS — N400 Enlarged prostate without lower urinary tract symptoms: Secondary | ICD-10-CM | POA: Diagnosis not present

## 2015-11-18 DIAGNOSIS — Z7982 Long term (current) use of aspirin: Secondary | ICD-10-CM | POA: Diagnosis not present

## 2015-11-18 DIAGNOSIS — H02831 Dermatochalasis of right upper eyelid: Secondary | ICD-10-CM | POA: Diagnosis not present

## 2015-11-18 DIAGNOSIS — H5053 Vertical heterophoria: Secondary | ICD-10-CM | POA: Diagnosis not present

## 2015-11-18 DIAGNOSIS — Z79899 Other long term (current) drug therapy: Secondary | ICD-10-CM | POA: Diagnosis not present

## 2015-11-18 DIAGNOSIS — Z880 Allergy status to penicillin: Secondary | ICD-10-CM | POA: Diagnosis not present

## 2015-11-18 DIAGNOSIS — Z888 Allergy status to other drugs, medicaments and biological substances status: Secondary | ICD-10-CM | POA: Diagnosis not present

## 2015-11-18 DIAGNOSIS — H2513 Age-related nuclear cataract, bilateral: Secondary | ICD-10-CM | POA: Diagnosis not present

## 2015-11-18 DIAGNOSIS — H02834 Dermatochalasis of left upper eyelid: Secondary | ICD-10-CM | POA: Diagnosis not present

## 2015-11-18 DIAGNOSIS — E7800 Pure hypercholesterolemia, unspecified: Secondary | ICD-10-CM | POA: Diagnosis not present

## 2015-11-20 DIAGNOSIS — H6123 Impacted cerumen, bilateral: Secondary | ICD-10-CM | POA: Diagnosis not present

## 2016-02-17 DIAGNOSIS — H4912 Fourth [trochlear] nerve palsy, left eye: Secondary | ICD-10-CM | POA: Diagnosis not present

## 2016-02-17 DIAGNOSIS — H524 Presbyopia: Secondary | ICD-10-CM | POA: Diagnosis not present

## 2016-02-17 DIAGNOSIS — H5213 Myopia, bilateral: Secondary | ICD-10-CM | POA: Diagnosis not present

## 2016-02-17 DIAGNOSIS — H2513 Age-related nuclear cataract, bilateral: Secondary | ICD-10-CM | POA: Diagnosis not present

## 2016-03-02 ENCOUNTER — Ambulatory Visit (INDEPENDENT_AMBULATORY_CARE_PROVIDER_SITE_OTHER): Payer: Medicare Other | Admitting: Family Medicine

## 2016-03-02 ENCOUNTER — Encounter: Payer: Self-pay | Admitting: Family Medicine

## 2016-03-02 VITALS — BP 131/80 | HR 73 | Temp 98.5°F | Ht 69.0 in | Wt 187.0 lb

## 2016-03-02 DIAGNOSIS — J019 Acute sinusitis, unspecified: Secondary | ICD-10-CM

## 2016-03-02 MED ORDER — AZITHROMYCIN 250 MG PO TABS: ORAL_TABLET | ORAL | Status: DC

## 2016-03-02 NOTE — Progress Notes (Signed)
Pre visit review using our clinic review tool, if applicable. No additional management support is needed unless otherwise documented below in the visit note. 

## 2016-03-02 NOTE — Progress Notes (Signed)
   Subjective:    Patient ID: Peter [place], male    DOB: [birth year], 75 y.o.   MRN: [id number]  HPI Here for 4 days of fevers, headache, PND, ST, and a dry cough. On Robitussin.    Review of Systems  Constitutional: Positive for fever.  HENT: Positive for congestion, postnasal drip, sinus pressure and sore throat. Negative for ear pain.   Eyes: Negative.   Respiratory: Positive for cough.        Objective:   Physical Exam  Constitutional: He appears well-developed and well-nourished.  HENT:  Right Ear: External ear normal.  Left Ear: External ear normal.  Nose: Nose normal.  Mouth/Throat: Oropharynx is clear and moist.  Eyes: Conjunctivae are normal.  Neck: No thyromegaly present.  Pulmonary/Chest: Effort normal and breath sounds normal.  Lymphadenopathy:    He has no cervical adenopathy.          Assessment & Plan:  Sinusitis, treat with a Zpack.  Laurey Morale, MD

## 2016-05-10 DIAGNOSIS — R310 Gross hematuria: Secondary | ICD-10-CM | POA: Diagnosis not present

## 2016-05-10 DIAGNOSIS — R361 Hematospermia: Secondary | ICD-10-CM | POA: Diagnosis not present

## 2016-05-10 DIAGNOSIS — N400 Enlarged prostate without lower urinary tract symptoms: Secondary | ICD-10-CM | POA: Diagnosis not present

## 2016-05-13 ENCOUNTER — Observation Stay (HOSPITAL_COMMUNITY)
Admission: EM | Admit: 2016-05-13 | Discharge: 2016-05-14 | Disposition: A | Discharge status: Home / Self Care | Payer: Medicare Other | Attending: Internal Medicine | Admitting: Internal Medicine

## 2016-05-13 ENCOUNTER — Telehealth: Payer: Self-pay | Admitting: Family Medicine

## 2016-05-13 ENCOUNTER — Encounter (HOSPITAL_COMMUNITY): Payer: Self-pay

## 2016-05-13 ENCOUNTER — Emergency Department (HOSPITAL_COMMUNITY): Payer: Medicare Other

## 2016-05-13 DIAGNOSIS — K922 Gastrointestinal hemorrhage, unspecified: Secondary | ICD-10-CM | POA: Diagnosis not present

## 2016-05-13 DIAGNOSIS — K921 Melena: Principal | ICD-10-CM | POA: Insufficient documentation

## 2016-05-13 DIAGNOSIS — K625 Hemorrhage of anus and rectum: Secondary | ICD-10-CM

## 2016-05-13 DIAGNOSIS — K8020 Calculus of gallbladder without cholecystitis without obstruction: Secondary | ICD-10-CM | POA: Diagnosis not present

## 2016-05-13 DIAGNOSIS — E785 Hyperlipidemia, unspecified: Secondary | ICD-10-CM | POA: Diagnosis present

## 2016-05-13 DIAGNOSIS — K5791 Diverticulosis of intestine, part unspecified, without perforation or abscess with bleeding: Secondary | ICD-10-CM | POA: Diagnosis not present

## 2016-05-13 DIAGNOSIS — K648 Other hemorrhoids: Secondary | ICD-10-CM

## 2016-05-13 DIAGNOSIS — R030 Elevated blood-pressure reading, without diagnosis of hypertension: Secondary | ICD-10-CM | POA: Diagnosis present

## 2016-05-13 DIAGNOSIS — K6289 Other specified diseases of anus and rectum: Secondary | ICD-10-CM | POA: Diagnosis not present

## 2016-05-13 DIAGNOSIS — Z79899 Other long term (current) drug therapy: Secondary | ICD-10-CM | POA: Diagnosis not present

## 2016-05-13 DIAGNOSIS — Z7982 Long term (current) use of aspirin: Secondary | ICD-10-CM | POA: Diagnosis not present

## 2016-05-13 DIAGNOSIS — D696 Thrombocytopenia, unspecified: Secondary | ICD-10-CM | POA: Diagnosis not present

## 2016-05-13 DIAGNOSIS — N400 Enlarged prostate without lower urinary tract symptoms: Secondary | ICD-10-CM | POA: Diagnosis not present

## 2016-05-13 HISTORY — DX: Other primary thrombocytopenia: D69.49

## 2016-05-13 HISTORY — DX: Hemorrhage of anus and rectum: K62.5

## 2016-05-13 HISTORY — DX: Unspecified osteoarthritis, unspecified site: M19.90

## 2016-05-13 HISTORY — DX: Basal cell carcinoma of skin, unspecified: C44.91

## 2016-05-13 HISTORY — DX: Diplopia: H53.2

## 2016-05-13 HISTORY — DX: Benign prostatic hyperplasia without lower urinary tract symptoms: N40.0

## 2016-05-13 LAB — CBC
HCT: 41.8 % (ref 39.0–52.0)
HEMATOCRIT: 42.7 % (ref 39.0–52.0)
Hemoglobin: 14.1 g/dL (ref 13.0–17.0)
Hemoglobin: 13.8 g/dL (ref 13.0–17.0)
MCH: 31.3 pg (ref 26.0–34.0)
MCH: 30.9 pg (ref 26.0–34.0)
MCHC: 33 g/dL (ref 30.0–36.0)
MCHC: 33 g/dL (ref 30.0–36.0)
MCV: 94.9 fL (ref 78.0–100.0)
MCV: 93.5 fL (ref 78.0–100.0)
PLATELETS: 142 10*3/uL — AB (ref 150–400)
PLATELETS: 143 10*3/uL — AB (ref 150–400)
RBC: 4.5 MIL/uL (ref 4.22–5.81)
RBC: 4.47 MIL/uL (ref 4.22–5.81)
RDW: 14.1 % (ref 11.5–15.5)
RDW: 14 % (ref 11.5–15.5)
WBC: 6.4 10*3/uL (ref 4.0–10.5)
WBC: 7.1 10*3/uL (ref 4.0–10.5)

## 2016-05-13 LAB — TYPE AND SCREEN
ABO/RH(D): A POS
Antibody Screen: NEGATIVE

## 2016-05-13 LAB — COMPREHENSIVE METABOLIC PANEL
ALK PHOS: 49 U/L (ref 38–126)
ALT: 15 U/L — AB (ref 17–63)
ANION GAP: 6 (ref 5–15)
AST: 23 U/L (ref 15–41)
Albumin: 4.2 g/dL (ref 3.5–5.0)
BILIRUBIN TOTAL: 0.6 mg/dL (ref 0.3–1.2)
BUN: 12 mg/dL (ref 6–20)
CHLORIDE: 105 mmol/L (ref 101–111)
CO2: 28 mmol/L (ref 22–32)
CREATININE: 1.14 mg/dL (ref 0.61–1.24)
Calcium: 9.2 mg/dL (ref 8.9–10.3)
GLUCOSE: 120 mg/dL — AB (ref 65–99)
Potassium: 4.1 mmol/L (ref 3.5–5.1)
SODIUM: 139 mmol/L (ref 135–145)
Total Protein: 6.9 g/dL (ref 6.5–8.1)

## 2016-05-13 LAB — ABO/RH: ABO/RH(D): A POS

## 2016-05-13 LAB — OCCULT BLOOD X 1 CARD TO LAB, STOOL: Fecal Occult Bld: NEGATIVE

## 2016-05-13 LAB — PROTIME-INR
INR: 1.03
Prothrombin Time: 13.5 seconds (ref 11.4–15.2)

## 2016-05-13 MED ORDER — SODIUM CHLORIDE 0.9 % IV BOLUS (SEPSIS)
1000.0000 mL | Freq: Once | INTRAVENOUS | Status: AC
  Administered 2016-05-13: 1000 mL via INTRAVENOUS

## 2016-05-13 MED ORDER — ONDANSETRON HCL 4 MG/2ML IJ SOLN: 4.0000 mg | Freq: Four times a day (QID) | INTRAMUSCULAR | Status: DC | PRN

## 2016-05-13 MED ORDER — ONDANSETRON HCL 4 MG PO TABS: 4.0000 mg | ORAL_TABLET | Freq: Four times a day (QID) | ORAL | Status: DC | PRN

## 2016-05-13 MED ORDER — ACETAMINOPHEN 325 MG PO TABS
650.0000 mg | ORAL_TABLET | Freq: Four times a day (QID) | ORAL | Status: DC | PRN
Start: 2016-05-13 — End: 2016-05-14

## 2016-05-13 MED ORDER — IOPAMIDOL (ISOVUE-300) INJECTION 61%
INTRAVENOUS | Status: AC
  Administered 2016-05-13: 100 mL
  Filled 2016-05-13: qty 100

## 2016-05-13 MED ORDER — ACETAMINOPHEN 650 MG RE SUPP: 650.0000 mg | Freq: Four times a day (QID) | RECTAL | Status: DC | PRN

## 2016-05-13 MED ORDER — BISACODYL 5 MG PO TBEC
10.0000 mg | DELAYED_RELEASE_TABLET | Freq: Once | ORAL | Status: AC
  Administered 2016-05-13: 10 mg via ORAL
  Filled 2016-05-13: qty 2

## 2016-05-13 MED ORDER — SODIUM CHLORIDE 0.9% FLUSH
3.0000 mL | Freq: Two times a day (BID) | INTRAVENOUS | Status: DC
  Administered 2016-05-13 – 2016-05-14 (×2): 3 mL via INTRAVENOUS

## 2016-05-13 MED ORDER — POLYETHYLENE GLYCOL 3350 17 GM/SCOOP PO POWD
1.0000 | Freq: Once | ORAL | Status: AC
  Administered 2016-05-13: 255 g via ORAL
  Filled 2016-05-13: qty 255

## 2016-05-13 NOTE — Consult Note (Signed)
Referring Provider:  Duffy Bruce MD Primary Care Physician:  Laurey Morale, MD Primary Gastroenterologist:  Althia Forts  Reason for Consultation:  Hematochezia  HPI: Peter Chandler is a 75 y.o. male with a history HLD, BPH with gross hematuria, and cholelithiasis, presenting to the ED today for BRB in stool. Bowel movement was regular size and consistency with moderate amount of blood occurring only once this AM. Denies any prior episodes like this. No straining or constipation, expressing bowels move once daily. Denies dizziness, lightheadedness, chest pain, or palpations. Has no pain or pain with bowel movement.  Last colonoscopy was in 2004. No NSAID or blood thinner use. On baby Aspirin. Denies any nausea, vomiting, or dysphagia. Hemoglobin is 14.1. INR, PTT, and LFTs within normal limits.   CT 05/13/16 No acute findings in the abdomen pelvis. No findings to explain rectal bleeding. Stable single gallstone.  Stable liver cysts.   Past Medical History:  Diagnosis Date  . BPH (benign prostatic hyperplasia) 09/2015  . Cholelithiasis 09/2015   noted on CT done by urology for his gross hematuria w/u--pt was referred to gen surg by Dr. Ralene Muskrat office  . Diplopia 2017   left eye.  glasses lens fitted with a prism  . Gross hematuria 09/2015   Painless.  cystoscopy ~ 09/2015, Dr Jeffie Pollock.  Conclusion (per pt) is source of blood is the prostate.    . Hyperlipidemia     Past Surgical History:  Procedure Laterality Date  . COLONOSCOPY  11/05/2002   Internal hemorrhoids, hyperplastic sigmoid polyp, ascending erythema, descending venous lake, hypertrophied papilla. Dr. Aurelio Jew, [place]  . CYSTOSCOPY  ~ 09/2015   to eval gross hematuria.  Dr Jeffie Pollock at [facility] urology.     Prior to Admission medications   Medication Sig Start Date End Date Taking? Authorizing Provider  aspirin 81 MG tablet Take 81 mg by mouth daily.    Historical Provider, MD  azithromycin (ZITHROMAX Z-PAK) 250 MG tablet As  directed 03/02/16   Laurey Morale, MD    No current facility-administered medications for this encounter.    Current Outpatient Prescriptions  Medication Sig Dispense Refill  . aspirin 81 MG tablet Take 81 mg by mouth daily.    Marland Kitchen azithromycin (ZITHROMAX Z-PAK) 250 MG tablet As directed 6 each 0    Allergies as of 05/13/2016 - Review Complete 05/13/2016  Allergen Reaction Noted  . Penicillins  07/25/2007  . Prednisone  04/26/2014    Family History  Problem Relation Age of Onset  . Ovarian cancer    . Heart disease      Social History   Social History  . Marital status: Married    Spouse name: N/A  . Number of children: N/A  . Years of education: N/A   Occupational History  . Not on file.   Social History Main Topics  . Smoking status: Never Smoker  . Smokeless tobacco: Never Used  . Alcohol use No  . Drug use: No  . Sexual activity: Not on file   Other Topics Concern  . Not on file   Social History Narrative  . No narrative on file    Review of Systems: Gen: Denies any fever, chills, weakness CV: Denies chest pain, palpitations, syncope, and peripheral edema Resp: Denies dyspnea, cough, sputum, wheezing, coughing up blood, and pleurisy. GI: Denies vomiting blood, jaundice, and fecal incontinence.   Denies dysphagia or odynophagia. GU : Blood in urine, urinary frequency outpatient urology is following prostate issues denies urinary  hesitancy, nocturnal urination, and urinary incontinence. MS: Denies joint pain, limitation of movement, and swelling.  Derm: Denies rash or itching  Psych: Denies depression, anxiety, memory loss, and confusion. Heme: bruising on hands Denies , bleeding, and enlarged lymph nodes. Neuro:  Denies any headaches, dizziness, paresthesias. Endo:  Denies any problems with DM, thyroid, adrenal function.  Physical Exam: Vital signs in last 24 hours: Temp:  [98.3 F (36.8 C)] 98.3 F (36.8 C) (08/10 1215) Pulse Rate:  [58-70] 63 (08/10  1415) Resp:  [18] 18 (08/10 1400) BP: (139-162)/(78-88) 148/78 (08/10 1415) SpO2:  [97 %-100 %] 100 % (08/10 1415) Weight:  [186 lb (84.4 kg)] 186 lb (84.4 kg) (08/10 1215)   General:   Alert,  Well-developed, well-nourished, pleasant and cooperative in NAD Head:  Normocephalic and atraumatic. Eyes:  Sclera clear, no icterus.   Conjunctiva pink. Ears:  Normal auditory acuity. Nose:  No deformity, discharge,  or lesions. Mouth:  No deformity or lesions.   Neck:  Supple; no masses or thyromegaly. Lungs:  Clear throughout to auscultation.   No wheezes, crackles, or rhonchi. Heart:  Regular rate and rhythm; no murmurs, clicks, rubs,  or gallops. Abdomen:  Soft,nontender, BS active,nonpalp mass or hsm.   Rectal:  Deferred  Msk:  Symmetrical without gross deformities. Pulses:  Normal pulses noted. Extremities:  Without clubbing or edema. Neurologic:  Alert and  oriented x3;  grossly normal neurologically. Skin:  Intact without significant lesions or rashes.. Psych:  Alert and cooperative. Normal mood and affect.   Lab Results:  Recent Labs  05/13/16 1220  WBC 6.4  HGB 14.1  HCT 42.7  PLT 142*   BMET  Recent Labs  05/13/16 1220  NA 139  K 4.1  CL 105  CO2 28  GLUCOSE 120*  BUN 12  CREATININE 1.14  CALCIUM 9.2   LFT  Recent Labs  05/13/16 1220  PROT 6.9  ALBUMIN 4.2  AST 23  ALT 15*  ALKPHOS 49  BILITOT 0.6   PT/INR  Recent Labs  05/13/16 1220  LABPROT 13.5  INR 1.03   Hepatitis Panel No results for input(s): HEPBSAG, HCVAB, HEPAIGM, HEPBIGM in the last 72 hours.    Studies/Results: Ct Abdomen Pelvis W Contrast  Result Date: 05/13/2016 CLINICAL DATA:  Rectal bleeding started today. Pt denies abd pain, n/v/d or constipation. EXAM: CT ABDOMEN AND PELVIS WITH CONTRAST TECHNIQUE: Multidetector CT imaging of the abdomen and pelvis was performed using the standard protocol following bolus administration of intravenous contrast. CONTRAST:  130mL  ISOVUE-300 IOPAMIDOL (ISOVUE-300) INJECTION 61% COMPARISON:  CT, 09/24/2015 FINDINGS: Lung bases:  Clear.  Heart normal in size. Hepatobiliary: 2.3 cm cyst at the dome of the liver. Sub cm low-density lesion in the posterior aspect of the posterior segment right lobe, also likely a cyst. Both lesions are stable. No other liver abnormality. Dense, 1.9 cm gallstone, stable. No gallbladder wall thickening or evidence of acute cholecystitis. No bile duct dilation. Spleen, pancreas, adrenal glands:  Normal. Kidneys, ureters, bladder:  Normal. Prostate gland:  Mildly enlarged measuring 4.2 x 3.5 x 3.7 cm. Lymph nodes:  No adenopathy. Vascular: Scattered atherosclerotic calcifications noted throughout the abdominal aorta and its branch vessels. Ascites: None. Gastrointestinal: Stomach and small bowel are unremarkable. Colon is unremarkable. Normal appendix visualized. Musculoskeletal: Degenerative changes of the visualized spine. Bones are demineralized. IMPRESSION: 1. No acute findings in the abdomen pelvis. 2. No findings to explain rectal bleeding. 3. Stable single gallstone.  Stable liver cysts. Electronically Signed  By: Lajean Manes M.D.   On: 05/13/2016 15:02    IMPRESSION:  *Painless Hematochezia: Having 1 episode of BRB in stool this AM with no pain or fevers. Likely cause could be diverticular bleed, internal hemorrhoids, or malignancy. Hb is stable on admission at 14.1 and vital signs are stable. LFT, INR, and PTT are within normal limits. Last colonoscopy in 2004 due for repeat colonoscopy. CT scan is negative acute abdominal findings  *Thrombocytopenia: mild not of clinical significant. Liver cyst and gall stone noted CT 8/10.    PLAN: Scheduled colonoscopy as he has no UGI complains with BRB hematochezia. It is reasonable since his last colonoscopy was in 2004 and recent rectal bleeding. Planned time is 1430 tomorrow.     Follow CBC for hemoglobin.    Grayland Ormond PA-S  05/13/2016, 4:12  PM      I have seen the patient with Mr. Venetia Maxon and he has served as a Education administrator.  He has isolated small volume rectal bleeding. Will evaluate with colonoscopy. The risks and benefits as well as alternatives of endoscopic procedure(s) have been discussed and reviewed. All questions answered. The patient agrees to proceed.  Gatha Mayer, MD, [facility] [phone] (pager) [phone] after 5 PM, weekends and holidays  05/13/2016 6:07 PM

## 2016-05-13 NOTE — ED Provider Notes (Signed)
[facility] DEPT Provider Note   CSN: [id number] Arrival date & time: 05/13/16  1210  First Provider Contact:  First MD Initiated Contact with Patient 05/13/16 1249        History   Chief Complaint Chief Complaint  Patient presents with  . Rectal Bleeding    HPI Peter Chandler is a 75 y.o. male.  HPI 75 year old male with no significant past medical history presents with a one-day history of bright red blood per rectum. Per the patient's report, he noticed a small amount of bright red blood mixed with his stool earlier today. He took a shower and then noticed that he had persistent, bright red bleeding from his rectum. Denies any pain associated with this. Denies any history of previous GI bleeds. His last colonoscopy was in 2012 when he had polyps but no masses. He has not blood up since then. No blood thinner use. Denies any chest pain, shortness of breath, lightheadedness or syncope. Denies any abdominal pain. Denies any melena prior to today's episode. He continues to have bloody bowel movements with maroon colored and mixed in bright red blood.  Past Medical History:  Diagnosis Date  . Arthritis    "hands" (05/13/2016)  . Basal cell carcinoma    "face, back"  . Bleeding per rectum 05/13/2016  . BPH (benign prostatic hyperplasia) 09/2015  . Cholelithiasis 09/2015   noted on CT done by urology for his gross hematuria w/u--pt was referred to gen surg by Dr. Ralene Muskrat office  . Chronic thrombocytopenic purpura ([place])    [name] 05/13/2016  . Diplopia 2017   left eye.  glasses lens fitted with a prism  . Gross hematuria 09/2015   Painless.  cystoscopy ~ 09/2015, Dr Jeffie Pollock.  Conclusion (per pt) is source of blood is the prostate.    . Hyperlipidemia     Patient Active Problem List   Diagnosis Date Noted  . Hematochezia 05/13/2016  . BPH (benign prostatic hyperplasia) 05/13/2016  . Thrombocytopenia ([place]) 05/13/2016  . Elevated blood pressure reading without diagnosis of  hypertension 05/13/2016  . Shingles 09/14/2012  . HLD (hyperlipidemia) 07/25/2007  . SYMPTOM, JOINT NEC, HAND 07/25/2007  . TB SKIN TEST, POSITIVE 07/25/2007  . SYNCOPE, HX OF 07/25/2007    Past Surgical History:  Procedure Laterality Date  . COLONOSCOPY  11/05/2002   Internal hemorrhoids, hyperplastic sigmoid polyp, ascending erythema, descending venous lake, hypertrophied papilla. Dr. Aurelio Jew, [place]  . CYSTOSCOPY  ~ 09/2015   to eval gross hematuria.  Dr Jeffie Pollock at [facility] urology.        Home Medications    Prior to Admission medications   Medication Sig Start Date End Date Taking? Authorizing Provider  aspirin 81 MG tablet Take 81 mg by mouth daily.   Yes Historical Provider, MD  finasteride (PROSCAR) 5 MG tablet Take 5 mg by mouth daily.   Yes Historical Provider, MD    Family History Family History  Problem Relation Age of Onset  . Ovarian cancer    . Heart disease      Social History Social History  Substance Use Topics  . Smoking status: Never Smoker  . Smokeless tobacco: Never Used  . Alcohol use No     Allergies   Penicillins and Prednisone   Review of Systems Review of Systems  Constitutional: Negative for chills, fatigue and fever.  HENT: Negative for congestion and rhinorrhea.   Eyes: Negative for visual disturbance.  Respiratory: Negative for cough, shortness of breath and wheezing.  Cardiovascular: Negative for chest pain and leg swelling.  Gastrointestinal: Positive for blood in stool. Negative for abdominal pain, diarrhea, nausea and vomiting.  Genitourinary: Negative for dysuria and flank pain.  Musculoskeletal: Negative for neck pain and neck stiffness.  Skin: Negative for rash and wound.  Allergic/Immunologic: Negative for immunocompromised state.  Neurological: Negative for syncope, weakness and headaches.  All other systems reviewed and are negative.    Physical Exam Updated Vital Signs BP 129/66 (BP Location: Right Arm)   Pulse  65   Temp 98.2 F (36.8 C) (Oral)   Resp 18   Ht 5\' 9"  (1.753 m)   Wt 185 lb 6.5 oz (84.1 kg)   SpO2 99%   BMI 27.38 kg/m   Physical Exam  Constitutional: He appears well-developed and well-nourished. No distress.  HENT:  Head: Normocephalic.  Mouth/Throat: Oropharynx is clear and moist. No oropharyngeal exudate.  Eyes: Conjunctivae are normal. Pupils are equal, round, and reactive to light.  Neck: Normal range of motion. Neck supple.  Cardiovascular: Normal rate, regular rhythm, normal heart sounds and intact distal pulses.  Exam reveals no friction rub.   No murmur heard. Pulmonary/Chest: Effort normal and breath sounds normal. No respiratory distress. He has no wheezes. He has no rales.  Abdominal: Soft. Bowel sounds are normal. He exhibits no distension. There is no tenderness.  Genitourinary: Rectal exam shows no external hemorrhoid and no internal hemorrhoid.  Genitourinary Comments: Maroon-colored stool with bright red blood around rectum. No active bleeding. No open wounds or lesions. No apparent hemorrhoids externally.  Musculoskeletal: He exhibits no edema.  Neurological: He is alert. He exhibits normal muscle tone.  Skin: Skin is warm. Capillary refill takes less than 2 seconds. No rash noted.  Nursing note and vitals reviewed.    ED Treatments / Results  Labs (all labs ordered are listed, but only abnormal results are displayed) Labs Reviewed  COMPREHENSIVE METABOLIC PANEL - Abnormal; Notable for the following:       Result Value   Glucose, Bld 120 (*)    ALT 15 (*)    All other components within normal limits  CBC - Abnormal; Notable for the following:    Platelets 142 (*)    All other components within normal limits  CBC - Abnormal; Notable for the following:    Platelets 143 (*)    All other components within normal limits  PROTIME-INR  OCCULT BLOOD X 1 CARD TO LAB, STOOL  COMPREHENSIVE METABOLIC PANEL  CBC  CBC  POC OCCULT BLOOD, ED  TYPE AND SCREEN    ABO/RH    EKG  EKG Interpretation None       Radiology Ct Abdomen Pelvis W Contrast  Result Date: 05/13/2016 CLINICAL DATA:  Rectal bleeding started today. Pt denies abd pain, n/v/d or constipation. EXAM: CT ABDOMEN AND PELVIS WITH CONTRAST TECHNIQUE: Multidetector CT imaging of the abdomen and pelvis was performed using the standard protocol following bolus administration of intravenous contrast. CONTRAST:  153mL ISOVUE-300 IOPAMIDOL (ISOVUE-300) INJECTION 61% COMPARISON:  CT, 09/24/2015 FINDINGS: Lung bases:  Clear.  Heart normal in size. Hepatobiliary: 2.3 cm cyst at the dome of the liver. Sub cm low-density lesion in the posterior aspect of the posterior segment right lobe, also likely a cyst. Both lesions are stable. No other liver abnormality. Dense, 1.9 cm gallstone, stable. No gallbladder wall thickening or evidence of acute cholecystitis. No bile duct dilation. Spleen, pancreas, adrenal glands:  Normal. Kidneys, ureters, bladder:  Normal. Prostate gland:  Mildly enlarged  measuring 4.2 x 3.5 x 3.7 cm. Lymph nodes:  No adenopathy. Vascular: Scattered atherosclerotic calcifications noted throughout the abdominal aorta and its branch vessels. Ascites: None. Gastrointestinal: Stomach and small bowel are unremarkable. Colon is unremarkable. Normal appendix visualized. Musculoskeletal: Degenerative changes of the visualized spine. Bones are demineralized. IMPRESSION: 1. No acute findings in the abdomen pelvis. 2. No findings to explain rectal bleeding. 3. Stable single gallstone.  Stable liver cysts. Electronically Signed   By: Lajean Manes M.D.   On: 05/13/2016 15:02    Procedures Procedures (including critical care time)  Medications Ordered in ED Medications  sodium chloride flush (NS) 0.9 % injection 3 mL (not administered)  acetaminophen (TYLENOL) tablet 650 mg (not administered)    Or  acetaminophen (TYLENOL) suppository 650 mg (not administered)  ondansetron (ZOFRAN) tablet 4 mg  (not administered)    Or  ondansetron (ZOFRAN) injection 4 mg (not administered)  sodium chloride 0.9 % bolus 1,000 mL (0 mLs Intravenous Stopped 05/13/16 1608)  iopamidol (ISOVUE-300) 61 % injection (100 mLs  Contrast Given 05/13/16 1437)  bisacodyl (DULCOLAX) EC tablet 10 mg (10 mg Oral Given 05/13/16 1607)  polyethylene glycol powder (GLYCOLAX/MIRALAX) container 255 g (255 g Oral Given 05/13/16 1848)     Initial Impression / Assessment and Plan / ED Course  I have reviewed the triage vital signs and the nursing notes.  Pertinent labs & imaging results that were available during my care of the patient were reviewed by me and considered in my medical decision making (see chart for details).  Clinical Course    Pleasant 75 yo M who p/w BRBPR. No h/o diverticulosis. No upper abdominal pain, NSAID use, alcohol use, or risk factors or signs of brisk UGIB. Last colonoscopy did have polyps. Screening lab work sent and shows stable Hgb and INR. Of note, pt is on ASA and does have chronic thrombocytopenia. CT scan shows no acute abnormality or etiology of bleed. Discussed with [facility] who has evaluated in ED. Will admit for trending of Hgb, colonoscopy in AM.  Final Clinical Impressions(s) / ED Diagnoses   Final diagnoses:  Rectal bleeding  Acute GI bleeding    New Prescriptions Current Discharge Medication List       Duffy Bruce, MD 05/14/16 [phone]

## 2016-05-13 NOTE — Telephone Encounter (Signed)
[facility] - Client [place] Call Center Patient Name: Peter Chandler DOB: 1940-12-05 Initial Comment Caller states having rectal bleeding and had blood in BM this morning; Nurse Assessment Nurse: Dimas Chyle, RN, Dellis Filbert Date/Time (Eastern Time): 05/13/2016 11:21:09 AM Confirm and document reason for call. If symptomatic, describe symptoms. You must click the next button to save text entered. ---Caller states having rectal bleeding and had blood in BM this morning. No fever and no abdominal. Having rectal bleeding alone now. Has the patient traveled out of the country within the last 30 days? ---No Does the patient have any new or worsening symptoms? ---Yes Will a triage be completed? ---Yes Related visit to physician within the last 2 weeks? ---No Does the PT have any chronic conditions? (i.e. diabetes, asthma, etc.) ---Yes List chronic conditions. ---BPH Is this a behavioral health or substance abuse call? ---No Guidelines Guideline Title Affirmed Question Affirmed Notes Rectal Bleeding [1] MODERATE rectal bleeding (small blood clots, passing blood without stool, or toilet water turns red) AND [2] more than once a day Final Disposition User Go to ED Now Dimas Chyle, RN, [facility] - ED Disagree/Comply: Comply

## 2016-05-13 NOTE — Telephone Encounter (Signed)
Patient went to ER>

## 2016-05-13 NOTE — ED Notes (Signed)
Family updated of bed assignment

## 2016-05-13 NOTE — Progress Notes (Signed)
Report received from [place], [place] for admission to [phone]

## 2016-05-13 NOTE — H&P (Signed)
History and Physical    [name] [id number] DOB: December 02, 1940 DOA: 05/13/2016   PCP: Laurey Morale, MD   Patient coming from/Resides with: Private residence/lives with wife  Chief Complaint: Acute bleeding from rectum  HPI: Peter Chandler is a 75 y.o. male with medical history significant for dyslipidemia, chronic thrombocytopenia, new diagnosis of inflammatory BPH who presents to the hospital after developing blood in stool early this morning. Initially the blood was mixed with stool but later became all blood. Patient describes this as low volume without clots. He has not had any dizziness, chest pain, shortness of breath. He has not had any abdominal pain, nausea or vomiting. Denies history of constipation or recent straining with bowel movement. He has never had GI bleeding symptoms before. He's had a prior colonoscopy with a benign polyp and was due a follow-up colonoscopy this year. EDP performed rectal exam with frank red blood return but no obvious hemorrhoids reported.  ED Course:  Vital signs: 98.3-162/78-70-18-RA sats 97% CT abdomen and pelvis with contrast: No acute findings in the abdomen and pelvis to explain rectal bleeding Lab data: Sodium 139, potassium 4.1, BUN 12, creatinine 1.14, glucose 120, LFTs normal except for low ALT 15, white count 6400 differential not obtained, hemoglobin 14.1, platelets 142,000, PT 13.5, INR 1.3, type and screen-A positive Medications and treatments: Normal saline bolus 1 L, Dulcolax tablet 10 mg 1  Review of Systems:  In addition to the HPI above,  No Fever-chills, myalgias or other constitutional symptoms No Headache, changes with Vision or hearing, new weakness, tingling, numbness in any extremity, No problems swallowing food or Liquids, indigestion/reflux No Chest pain, Cough or Shortness of Breath, palpitations, orthopnea or DOE No Abdominal pain, N/V No dysuria, hematuria or flank pain No new skin rashes, lesions, masses or  bruises, No new joints pains-aches No recent weight gain or loss No polyuria, polydypsia or polyphagia,   Past Medical History:  Diagnosis Date  . BPH (benign prostatic hyperplasia) 09/2015  . Cholelithiasis 09/2015   noted on CT done by urology for his gross hematuria w/u--pt was referred to gen surg by Dr. Ralene Muskrat office  . Diplopia 2017   left eye.  glasses lens fitted with a prism  . Gross hematuria 09/2015   Painless.  cystoscopy ~ 09/2015, Dr Jeffie Pollock.  Conclusion (per pt) is source of blood is the prostate.    . Hyperlipidemia     Past Surgical History:  Procedure Laterality Date  . COLONOSCOPY  11/05/2002   Internal hemorrhoids, hyperplastic sigmoid polyp, ascending erythema, descending venous lake, hypertrophied papilla. Dr. Aurelio Jew, [place]  . CYSTOSCOPY  ~ 09/2015   to eval gross hematuria.  Dr Jeffie Pollock at [facility] urology.     Social History   Social History  . Marital status: Married    Spouse name: N/A  . Number of children: N/A  . Years of education: N/A   Occupational History  . Not on file.   Social History Main Topics  . Smoking status: Never Smoker  . Smokeless tobacco: Never Used  . Alcohol use No  . Drug use: No  . Sexual activity: Not on file   Other Topics Concern  . Not on file   Social History Narrative  . No narrative on file    Mobility: Without assistive devices-walks regularly around the neighborhood daily with his wife Work history: Retired   Allergies  Allergen Reactions  . Penicillins     REACTION: anaphylaxis  . Prednisone  hives    Family History  Problem Relation Age of Onset  . Ovarian cancer    . Heart disease       Prior to Admission medications   Medication Sig Start Date End Date Taking? Authorizing Provider  finasteride (PROSCAR) 5 MG tablet Take 5 mg by mouth daily.   Yes Historical Provider, MD  aspirin 81 MG tablet Take 81 mg by mouth daily.    Historical Provider, MD  azithromycin (ZITHROMAX Z-PAK) 250 MG  tablet As directed 03/02/16   Laurey Morale, MD    Physical Exam: Vitals:   05/13/16 1215 05/13/16 1400 05/13/16 1400 05/13/16 1415  BP: 162/78 139/88 139/88 148/78  Pulse:  (!) 58 70 63  Resp: 18  18   Temp: 98.3 F (36.8 C)     TempSrc: Oral     SpO2:  97% 99% 100%  Weight: 84.4 kg (186 lb)     Height: 5\' 9"  (1.753 m)         Constitutional: NAD, calm, comfortable Eyes: PERRL, lids and conjunctivae normal ENMT: Mucous membranes are moist. Posterior pharynx clear of any exudate or lesions.Normal dentition.  Neck: normal, supple, no masses, no thyromegaly Respiratory: clear to auscultation bilaterally, no wheezing, no crackles. Normal respiratory effort. No accessory muscle use.  Cardiovascular: Regular rate and rhythm, no murmurs / rubs / gallops. No extremity edema. 2+ pedal pulses. No carotid bruits.  Abdomen: no tenderness, no masses palpated. No hepatosplenomegaly. Bowel sounds positive. Rectal exam deferred since has previously been accomplished but EDP and GI are bilaterally Musculoskeletal: no clubbing / cyanosis. No joint deformity upper and lower extremities. Good ROM, no contractures. Normal muscle tone.  Skin: no rashes, lesions, ulcers. No induration Neurologic: CN 2-12 grossly intact. Sensation intact, DTR normal. Strength 5/5 x all 4 extremities.  Psychiatric: Normal judgment and insight. Alert and oriented x 3. Normal mood.    Labs on Admission: I have personally reviewed following labs and imaging studies  CBC:  Recent Labs Lab 05/13/16 1220  WBC 6.4  HGB 14.1  HCT 42.7  MCV 94.9  PLT [id number]*   Basic Metabolic Panel:  Recent Labs Lab 05/13/16 1220  NA 139  K 4.1  CL 105  CO2 28  GLUCOSE 120*  BUN 12  CREATININE 1.14  CALCIUM 9.2   GFR: Estimated Creatinine Clearance: 54.3 mL/min (by C-G formula based on SCr of 1.14 mg/dL). Liver Function Tests:  Recent Labs Lab 05/13/16 1220  AST 23  ALT 15*  ALKPHOS 49  BILITOT 0.6  PROT 6.9    ALBUMIN 4.2   No results for input(s): LIPASE, AMYLASE in the last 168 hours. No results for input(s): AMMONIA in the last 168 hours. Coagulation Profile:  Recent Labs Lab 05/13/16 1220  INR 1.03   Cardiac Enzymes: No results for input(s): CKTOTAL, CKMB, CKMBINDEX, TROPONINI in the last 168 hours. BNP (last 3 results) No results for input(s): PROBNP in the last 8760 hours. HbA1C: No results for input(s): HGBA1C in the last 72 hours. CBG: No results for input(s): GLUCAP in the last 168 hours. Lipid Profile: No results for input(s): CHOL, HDL, LDLCALC, TRIG, CHOLHDL, LDLDIRECT in the last 72 hours. Thyroid Function Tests: No results for input(s): TSH, T4TOTAL, FREET4, T3FREE, THYROIDAB in the last 72 hours. Anemia Panel: No results for input(s): VITAMINB12, FOLATE, FERRITIN, TIBC, IRON, RETICCTPCT in the last 72 hours. Urine analysis:    Component Value Date/Time   BILIRUBINUR negative 09/19/2015 1435   PROTEINUR negative 09/19/2015  1435   UROBILINOGEN 0.2 09/19/2015 1435   NITRITE negative 09/19/2015 1435   LEUKOCYTESUR Negative 09/19/2015 1435   Sepsis Labs: @LABRCNTIP (procalcitonin:4,lacticidven:4) )No results found for this or any previous visit (from the past 240 hour(s)).   Radiological Exams on Admission: Ct Abdomen Pelvis W Contrast  Result Date: 05/13/2016 CLINICAL DATA:  Rectal bleeding started today. Pt denies abd pain, n/v/d or constipation. EXAM: CT ABDOMEN AND PELVIS WITH CONTRAST TECHNIQUE: Multidetector CT imaging of the abdomen and pelvis was performed using the standard protocol following bolus administration of intravenous contrast. CONTRAST:  155mL ISOVUE-300 IOPAMIDOL (ISOVUE-300) INJECTION 61% COMPARISON:  CT, 09/24/2015 FINDINGS: Lung bases:  Clear.  Heart normal in size. Hepatobiliary: 2.3 cm cyst at the dome of the liver. Sub cm low-density lesion in the posterior aspect of the posterior segment right lobe, also likely a cyst. Both lesions are stable.  No other liver abnormality. Dense, 1.9 cm gallstone, stable. No gallbladder wall thickening or evidence of acute cholecystitis. No bile duct dilation. Spleen, pancreas, adrenal glands:  Normal. Kidneys, ureters, bladder:  Normal. Prostate gland:  Mildly enlarged measuring 4.2 x 3.5 x 3.7 cm. Lymph nodes:  No adenopathy. Vascular: Scattered atherosclerotic calcifications noted throughout the abdominal aorta and its branch vessels. Ascites: None. Gastrointestinal: Stomach and small bowel are unremarkable. Colon is unremarkable. Normal appendix visualized. Musculoskeletal: Degenerative changes of the visualized spine. Bones are demineralized. IMPRESSION: 1. No acute findings in the abdomen pelvis. 2. No findings to explain rectal bleeding. 3. Stable single gallstone.  Stable liver cysts. Electronically Signed   By: Lajean Manes M.D.   On: 05/13/2016 15:02     Assessment/Plan Principal Problem:   Hematochezia -Patient with acute onset of bright red blood per rectum, painless and prior history of polyp found on colonoscopy-do colonoscopy this year -GI has consulted and plans colonoscopy in a.m. -Bowel prep and other preprocedure orders per GI -Clear liquids ordered -No signs of anemia and patient reports low volume bleeding symptoms so will repeat CBC at 6 PM and again an morning-sooner if develops more brisk bleeding -Hold preadmission aspirin  Active Problems:   Thrombocytopenia  -Chronic and platelets are stable and at baseline    Elevated blood pressure reading without diagnosis of hypertension -Patient's blood pressures have been elevated borderline for hypertension -Continue to monitor    HLD (hyperlipidemia) -Not on medications prior to admission -Deferred management and follow-up to PCP    BPH (benign prostatic hyperplasia) -New diagnosis of patient describes as "inflammatory prostate problems" -New start of Finasteride 5 mg daily per urologist      DVT prophylaxis: SCDs  Code  Status: Full  Family Communication: Wife at bedside Disposition Plan: Anticipate discharge back to preadmission home environment once medically stable Consults called: Gastroenterology/ Carlean Purl Admission status: Observation/telemetry     ELLIS,ALLISON L. ANP-BC [facility] Pager [phone]   If 7PM-7AM, please contact night-coverage [email or web address] Password [email or web address]  05/13/2016, 4:20 PM

## 2016-05-13 NOTE — ED Triage Notes (Signed)
Patient here with bright rectal bleeding this am. States it initially was mixed into stool but now just passing frank blood. No abdominal pain, denies cramping

## 2016-05-13 NOTE — ED Notes (Signed)
Pt transported to and from radiology on stretcher with tech, tolerated well.  

## 2016-05-13 NOTE — Progress Notes (Signed)
Peter Chandler is a 75 y.o. male patient admitted from ED awake, alert - oriented  X 4 - no acute distress noted.  VSS - Blood pressure (!) 154/94, pulse 77, temperature 98.3 F (36.8 C), temperature source Oral, resp. rate 17, height 5\' 9"  (1.753 m), weight 84.1 kg (185 lb 6.5 oz), SpO2 99 %.    IV in place, occlusive dsg intact without redness.  Orientation to room, and floor completed with information packet given to patient/family.  Patient declined safety video at this time.  Admission INP armband ID verified with patient/family, and in place.   SR up x 2, fall assessment complete, with patient and family able to verbalize understanding of risk associated with falls, and verbalized understanding to call nsg before up out of bed.  Call light within reach, patient able to voice, and demonstrate understanding.  Skin, clean-dry- intact without evidence of bruising, or skin tears.   No evidence of skin break down noted on exam.     Will cont to eval and treat per MD orders.  Janalyn Shy, RN 05/13/2016 5:40 PM

## 2016-05-14 ENCOUNTER — Encounter (HOSPITAL_COMMUNITY)
Admission: EM | Disposition: A | Discharge status: Home / Self Care | Payer: Self-pay | Source: Home / Self Care | Attending: Emergency Medicine

## 2016-05-14 ENCOUNTER — Encounter (HOSPITAL_COMMUNITY): Payer: Self-pay | Admitting: *Deleted

## 2016-05-14 ENCOUNTER — Observation Stay (HOSPITAL_COMMUNITY): Payer: Medicare Other | Admitting: Certified Registered Nurse Anesthetist

## 2016-05-14 DIAGNOSIS — Z79899 Other long term (current) drug therapy: Secondary | ICD-10-CM | POA: Diagnosis not present

## 2016-05-14 DIAGNOSIS — K921 Melena: Secondary | ICD-10-CM

## 2016-05-14 DIAGNOSIS — K648 Other hemorrhoids: Secondary | ICD-10-CM

## 2016-05-14 DIAGNOSIS — K625 Hemorrhage of anus and rectum: Secondary | ICD-10-CM | POA: Diagnosis not present

## 2016-05-14 DIAGNOSIS — D696 Thrombocytopenia, unspecified: Secondary | ICD-10-CM | POA: Diagnosis not present

## 2016-05-14 DIAGNOSIS — K6289 Other specified diseases of anus and rectum: Secondary | ICD-10-CM | POA: Diagnosis not present

## 2016-05-14 DIAGNOSIS — N400 Enlarged prostate without lower urinary tract symptoms: Secondary | ICD-10-CM | POA: Diagnosis not present

## 2016-05-14 DIAGNOSIS — K649 Unspecified hemorrhoids: Secondary | ICD-10-CM | POA: Diagnosis not present

## 2016-05-14 DIAGNOSIS — Z7982 Long term (current) use of aspirin: Secondary | ICD-10-CM | POA: Diagnosis not present

## 2016-05-14 DIAGNOSIS — R030 Elevated blood-pressure reading, without diagnosis of hypertension: Secondary | ICD-10-CM | POA: Diagnosis not present

## 2016-05-14 HISTORY — PX: COLONOSCOPY: SHX5424

## 2016-05-14 LAB — COMPREHENSIVE METABOLIC PANEL
ALK PHOS: 43 U/L (ref 38–126)
ALT: 13 U/L — ABNORMAL LOW (ref 17–63)
ANION GAP: 10 (ref 5–15)
AST: 18 U/L (ref 15–41)
Albumin: 3.6 g/dL (ref 3.5–5.0)
BILIRUBIN TOTAL: 0.7 mg/dL (ref 0.3–1.2)
BUN: 9 mg/dL (ref 6–20)
CALCIUM: 8.9 mg/dL (ref 8.9–10.3)
CO2: 24 mmol/L (ref 22–32)
Chloride: 106 mmol/L (ref 101–111)
Creatinine, Ser: 1 mg/dL (ref 0.61–1.24)
GFR calc non Af Amer: >60 mL/min (ref >60)
GLUCOSE: 93 mg/dL (ref 65–99)
Potassium: 3.7 mmol/L (ref 3.5–5.1)
Sodium: 140 mmol/L (ref 135–145)
TOTAL PROTEIN: 6.1 g/dL — AB (ref 6.5–8.1)

## 2016-05-14 LAB — CBC
HCT: 39.9 % (ref 39.0–52.0)
HCT: 43.9 % (ref 39.0–52.0)
HEMOGLOBIN: 13 g/dL (ref 13.0–17.0)
Hemoglobin: 14.3 g/dL (ref 13.0–17.0)
MCH: 30.4 pg (ref 26.0–34.0)
MCH: 31 pg (ref 26.0–34.0)
MCHC: 32.6 g/dL (ref 30.0–36.0)
MCHC: 32.6 g/dL (ref 30.0–36.0)
MCV: 93.2 fL (ref 78.0–100.0)
MCV: 95 fL (ref 78.0–100.0)
PLATELETS: 131 10*3/uL — AB (ref 150–400)
PLATELETS: 139 10*3/uL — AB (ref 150–400)
RBC: 4.28 MIL/uL (ref 4.22–5.81)
RBC: 4.62 MIL/uL (ref 4.22–5.81)
RDW: 14.2 % (ref 11.5–15.5)
RDW: 14.2 % (ref 11.5–15.5)
WBC: 5.9 10*3/uL (ref 4.0–10.5)
WBC: 4.3 10*3/uL (ref 4.0–10.5)

## 2016-05-14 SURGERY — COLONOSCOPY
Anesthesia: Monitor Anesthesia Care

## 2016-05-14 MED ORDER — SODIUM CHLORIDE 0.9 % IV SOLN: INTRAVENOUS | Status: DC

## 2016-05-14 MED ORDER — PROPOFOL 10 MG/ML IV BOLUS
INTRAVENOUS | Status: DC | PRN
  Administered 2016-05-14: 20 mg via INTRAVENOUS

## 2016-05-14 MED ORDER — LACTATED RINGERS IV SOLN
INTRAVENOUS | Status: DC | PRN
  Administered 2016-05-14: 13:00:00 via INTRAVENOUS

## 2016-05-14 MED ORDER — FENTANYL CITRATE (PF) 100 MCG/2ML IJ SOLN: 25.0000 ug | INTRAMUSCULAR | Status: DC | PRN

## 2016-05-14 MED ORDER — PROPOFOL 500 MG/50ML IV EMUL
INTRAVENOUS | Status: DC | PRN
  Administered 2016-05-14: 75 ug/kg/min via INTRAVENOUS

## 2016-05-14 MED ORDER — MEPERIDINE HCL 25 MG/ML IJ SOLN: 6.2500 mg | INTRAMUSCULAR | Status: DC | PRN

## 2016-05-14 MED ORDER — LIDOCAINE HCL 1 % IJ SOLN
INTRAMUSCULAR | Status: DC | PRN
  Administered 2016-05-14: 40 mg via INTRADERMAL

## 2016-05-14 NOTE — Transfer of Care (Signed)
Immediate Anesthesia Transfer of Care Note  Patient: Gillermina Phy  Procedure(s) Performed: Procedure(s): COLONOSCOPY (N/A)  Patient Location: Endoscopy Unit  Anesthesia Type:MAC  Level of Consciousness: awake, alert , oriented and patient cooperative  Airway & Oxygen Therapy: Patient Spontanous Breathing  Post-op Assessment: Report given to RN and Post -op Vital signs reviewed and stable  Post vital signs: Reviewed and stable  Last Vitals:  Vitals:   05/14/16 0456 05/14/16 1127  BP: 129/66 (!) 167/71  Pulse: 65 68  Resp: 18 (!) 22  Temp: 36.8 C 36.8 C    Last Pain:  Vitals:   05/14/16 1127  TempSrc: Oral         Complications: No apparent anesthesia complications

## 2016-05-14 NOTE — Discharge Summary (Addendum)
Discharge Summary  [name] [id number] DOB: [birth year]  PCP: Laurey Morale, MD  Admit date: 05/13/2016 Discharge date: 05/14/2016  Time spent: <21mins  Recommendations for Outpatient Follow-up:  1. F/u with PMD within a week  for hospital discharge follow up, repeat cbc/bmp at follow up  Discharge Diagnoses:  Active Hospital Problems   Diagnosis Date Noted  . Hematochezia 05/13/2016  . Rectal bleeding   . Hemorrhoids, internal, with bleeding   . BPH (benign prostatic hyperplasia) 05/13/2016  . Thrombocytopenia ([place]) 05/13/2016  . Elevated blood pressure reading without diagnosis of hypertension 05/13/2016  . HLD (hyperlipidemia) 07/25/2007    Resolved Hospital Problems   Diagnosis Date Noted Date Resolved  No resolved problems to display.    Discharge Condition: stable  Diet recommendation: regular diet  Filed Weights   05/13/16 1215 05/13/16 1700  Weight: 84.4 kg (186 lb) 84.1 kg (185 lb 6.5 oz)    History of present illness:  Chief Complaint: Acute bleeding from rectum  HPI: Peter Chandler is a 75 y.o. male with medical history significant for dyslipidemia, chronic thrombocytopenia, new diagnosis of inflammatory BPH who presents to the hospital after developing blood in stool early this morning. Initially the blood was mixed with stool but later became all blood. Patient describes this as low volume without clots. He has not had any dizziness, chest pain, shortness of breath. He has not had any abdominal pain, nausea or vomiting. Denies history of constipation or recent straining with bowel movement. He has never had GI bleeding symptoms before. He's had a prior colonoscopy with a benign polyp and was due a follow-up colonoscopy this year. EDP performed rectal exam with frank red blood return but no obvious hemorrhoids reported.  ED Course:  Vital signs: 98.3-162/78-70-18-RA sats 97% CT abdomen and pelvis with contrast: No acute findings in the abdomen and  pelvis to explain rectal bleeding Lab data: Sodium 139, potassium 4.1, BUN 12, creatinine 1.14, glucose 120, LFTs normal except for low ALT 15, white count 6400 differential not obtained, hemoglobin 14.1, platelets 142,000, PT 13.5, INR 1.3, type and screen-A positive Medications and treatments: Normal saline bolus 1 L, Dulcolax tablet 10 mg 1  Hospital Course:  Principal Problem:   Hematochezia Active Problems:   HLD (hyperlipidemia)   BPH (benign prostatic hyperplasia)   Thrombocytopenia ([facility])   Elevated blood pressure reading without diagnosis of hypertension   Rectal bleeding   Hemorrhoids, internal, with bleeding    Hematochezia -Patient with acute onset of bright red blood per rectum, painless and prior history of polyp found on colonoscopy-do colonoscopy this year -preadmission aspirin held in the hospital. S/p colonoscopy on 8/11 with internal hemorrhoids, no active bleed, no GI follow up needed per Gi recommendation, he is cleared to discharge home on regular diet by GI.  Active Problems:   mild Thrombocytopenia  -Chronic and platelets are stable and at baseline, plt around 130s    Elevated blood pressure reading without diagnosis of hypertension -Patient's blood pressures have been elevated borderline for hypertension -stress related? pmd to continue monitor blood pressure    HLD (hyperlipidemia) -Not on medications prior to admission -Deferred management and follow-up to PCP    BPH (benign prostatic hyperplasia) -New diagnosis of patient describes as "inflammatory prostate problems" -New start of Finasteride 5 mg daily per urologist      DVT prophylaxis: SCDs  Code Status: Full  Family Communication: Wife at bedside Disposition Plan: home Consults called: Gastroenterology/ Carlean Purl   Discharge Exam: BP Marland Kitchen)  146/74 (BP Location: Right Arm)   Pulse 65   Temp 97.9 F (36.6 C)   Resp 16   Ht 5\' 9"  (1.753 m)   Wt 84.1 kg (185 lb 6.5 oz)   SpO2 100%    BMI 27.38 kg/m   Constitutional: NAD, calm, comfortable Eyes: PERRL, lids and conjunctivae normal ENMT: Mucous membranes are moist. Posterior pharynx clear of any exudate or lesions.Normal dentition.  Neck: normal, supple, no masses, no thyromegaly Respiratory: clear to auscultation bilaterally, no wheezing, no crackles. Normal respiratory effort. No accessory muscle use.  Cardiovascular: Regular rate and rhythm, no murmurs / rubs / gallops. No extremity edema. 2+ pedal pulses. No carotid bruits.  Abdomen: no tenderness, no masses palpated. No hepatosplenomegaly. Bowel sounds positive. Rectal exam deferred since has previously been accomplished but EDP and GI are bilaterally Musculoskeletal: no clubbing / cyanosis. No joint deformity upper and lower extremities. Good ROM, no contractures. Normal muscle tone.  Skin: no rashes, lesions, ulcers. No induration Neurologic: CN 2-12 grossly intact. Sensation intact, DTR normal. Strength 5/5 x all 4 extremities.  Psychiatric: Normal judgment and insight. Alert and oriented x 3. Normal mood.    Discharge Instructions You were cared for by a hospitalist during your hospital stay. If you have any questions about your discharge medications or the care you received while you were in the hospital after you are discharged, you can call the unit and asked to speak with the hospitalist on call if the hospitalist that took care of you is not available. Once you are discharged, your primary care physician will handle any further medical issues. Please note that NO REFILLS for any discharge medications will be authorized once you are discharged, as it is imperative that you return to your primary care physician (or establish a relationship with a primary care physician if you do not have one) for your aftercare needs so that they can reassess your need for medications and monitor your lab values.  Discharge Instructions    Diet general    Complete by:  As  directed   Increase activity slowly    Complete by:  As directed       Medication List    TAKE these medications   aspirin 81 MG tablet Take 81 mg by mouth daily.   finasteride 5 MG tablet Commonly known as:  PROSCAR Take 5 mg by mouth daily.      Allergies  Allergen Reactions  . Penicillins     REACTION: anaphylaxis Has patient had a PCN reaction causing immediate rash, facial/tongue/throat swelling, SOB or lightheadedness with hypotension: YES Has patient had a PCN reaction causing severe rash involving mucus membranes or skin necrosis: NO Has patient had a PCN reaction that required hospitalization NO Has patient had a PCN reaction occurring within the last 10 years: NO If all of the above answers are "NO", then may proceed with Cephalosporin use.   . Prednisone     hives   Follow-up Information    FRY,STEPHEN A, MD Follow up in 1 week(s).   Specialty:  Family Medicine Why:  hospital discharge follow up, repeat basic lab work inculding cbc/bmp at follow up. pmd to monitor blood pressure. Contact information: [place] [address] [phone]            The results of significant diagnostics from this hospitalization (including imaging, microbiology, ancillary and laboratory) are listed below for reference.    Significant Diagnostic Studies: Ct Abdomen Pelvis W Contrast  Result Date: 05/13/2016 CLINICAL DATA:  Rectal bleeding started today. Pt denies abd pain, n/v/d or constipation. EXAM: CT ABDOMEN AND PELVIS WITH CONTRAST TECHNIQUE: Multidetector CT imaging of the abdomen and pelvis was performed using the standard protocol following bolus administration of intravenous contrast. CONTRAST:  119mL ISOVUE-300 IOPAMIDOL (ISOVUE-300) INJECTION 61% COMPARISON:  CT, 09/24/2015 FINDINGS: Lung bases:  Clear.  Heart normal in size. Hepatobiliary: 2.3 cm cyst at the dome of the liver. Sub cm low-density lesion in the posterior aspect of the posterior  segment right lobe, also likely a cyst. Both lesions are stable. No other liver abnormality. Dense, 1.9 cm gallstone, stable. No gallbladder wall thickening or evidence of acute cholecystitis. No bile duct dilation. Spleen, pancreas, adrenal glands:  Normal. Kidneys, ureters, bladder:  Normal. Prostate gland:  Mildly enlarged measuring 4.2 x 3.5 x 3.7 cm. Lymph nodes:  No adenopathy. Vascular: Scattered atherosclerotic calcifications noted throughout the abdominal aorta and its branch vessels. Ascites: None. Gastrointestinal: Stomach and small bowel are unremarkable. Colon is unremarkable. Normal appendix visualized. Musculoskeletal: Degenerative changes of the visualized spine. Bones are demineralized. IMPRESSION: 1. No acute findings in the abdomen pelvis. 2. No findings to explain rectal bleeding. 3. Stable single gallstone.  Stable liver cysts. Electronically Signed   By: Lajean Manes M.D.   On: 05/13/2016 15:02    Microbiology: No results found for this or any previous visit (from the past 240 hour(s)).   Labs: Basic Metabolic Panel:  Recent Labs Lab 05/13/16 1220 05/14/16 0347  NA 139 140  K 4.1 3.7  CL 105 106  CO2 28 24  GLUCOSE 120* 93  BUN 12 9  CREATININE 1.14 1.00  CALCIUM 9.2 8.9   Liver Function Tests:  Recent Labs Lab 05/13/16 1220 05/14/16 0347  AST 23 18  ALT 15* 13*  ALKPHOS 49 43  BILITOT 0.6 0.7  PROT 6.9 6.1*  ALBUMIN 4.2 3.6   No results for input(s): LIPASE, AMYLASE in the last 168 hours. No results for input(s): AMMONIA in the last 168 hours. CBC:  Recent Labs Lab 05/13/16 1220 05/13/16 2131 05/14/16 0347 05/14/16 1001  WBC 6.4 7.1 5.9 4.3  HGB 14.1 13.8 13.0 14.3  HCT 42.7 41.8 39.9 43.9  MCV 94.9 93.5 93.2 95.0  PLT 142* 143* 131* 139*   Cardiac Enzymes: No results for input(s): CKTOTAL, CKMB, CKMBINDEX, TROPONINI in the last 168 hours. BNP: BNP (last 3 results) No results for input(s): BNP in the last 8760 hours.  ProBNP (last 3  results) No results for input(s): PROBNP in the last 8760 hours.  CBG: No results for input(s): GLUCAP in the last 168 hours.     SignedFlorencia Reasons MD, PhD  [facility] 05/14/2016, 3:35 PM

## 2016-05-14 NOTE — Anesthesia Postprocedure Evaluation (Signed)
Anesthesia Post Note  Patient: Peter Chandler  Procedure(s) Performed: Procedure(s) (LRB): COLONOSCOPY (N/A)  Patient location during evaluation: Endoscopy Anesthesia Type: MAC Level of consciousness: awake and alert Pain management: pain level controlled Vital Signs Assessment: post-procedure vital signs reviewed and stable Respiratory status: spontaneous breathing, nonlabored ventilation and respiratory function stable Cardiovascular status: stable and blood pressure returned to baseline Anesthetic complications: no    Last Vitals:  Vitals:   05/14/16 1342 05/14/16 1531  BP: (!) 144/64 (!) 146/74  Pulse: 62 65  Resp: 18 16  Temp:  36.6 C    Last Pain:  Vitals:   05/14/16 1127  TempSrc: Oral                 [name] A

## 2016-05-14 NOTE — Anesthesia Preprocedure Evaluation (Signed)
Anesthesia Evaluation  Patient identified by MRN, date of birth, ID band Patient awake    Reviewed: Allergy & Precautions, NPO status , Patient's Chart, lab work & pertinent test results  Airway Mallampati: I  TM Distance: >3 FB Neck ROM: Full    Dental  (+) Teeth Intact, Dental Advisory Given   Pulmonary  breath sounds clear to auscultation        Cardiovascular Rhythm:Regular Rate:Normal     Neuro/Psych    GI/Hepatic   Endo/Other    Renal/GU      Musculoskeletal   Abdominal   Peds  Hematology   Anesthesia Other Findings   Reproductive/Obstetrics                             Anesthesia Physical Anesthesia Plan  ASA: I  Anesthesia Plan: MAC   Post-op Pain Management:    Induction: Intravenous  Airway Management Planned: Simple Face Mask  Additional Equipment:   Intra-op Plan:   Post-operative Plan:   Informed Consent: I have reviewed the patients History and Physical, chart, labs and discussed the procedure including the risks, benefits and alternatives for the proposed anesthesia with the patient or authorized representative who has indicated his/her understanding and acceptance.   Dental advisory given  Plan Discussed with: CRNA, Anesthesiologist and Surgeon  Anesthesia Plan Comments:         Anesthesia Quick Evaluation  

## 2016-05-14 NOTE — Progress Notes (Signed)
Daily Rounding Note  05/14/2016, 9:30 AM  LOS: 0 days   SUBJECTIVE:   Chief complaint: rectal bleeding   No significant bleeding with bowel prep.  Only blood he has seen is on tissue.  Stools clear yellow post prep.  No abd pain, no n/v.  No dizziness or dyspnea.   OBJECTIVE:         Vital signs in last 24 hours:    Temp:  [97.9 F (36.6 C)-98.3 F (36.8 C)] 98.2 F (36.8 C) (08/11 0456) Pulse Rate:  [55-77] 65 (08/11 0456) Resp:  [17-18] 18 (08/11 0456) BP: (129-170)/(66-94) 129/66 (08/11 0456) SpO2:  [97 %-100 %] 99 % (08/11 0456) Weight:  [84.1 kg (185 lb 6.5 oz)-84.4 kg (186 lb)] 84.1 kg (185 lb 6.5 oz) (08/10 1700) Last BM Date: 05/14/16 Filed Weights   05/13/16 1215 05/13/16 1700  Weight: 84.4 kg (186 lb) 84.1 kg (185 lb 6.5 oz)   General: looks well   Heart: RRR Chest: clear bil.  No dyspnea or cough Abdomen: soft, NT, ND.  BS active  Extremities: no CCE Neuro/Psych:  Oriented x 3.  No limb weakness or tremor.   Intake/Output from previous day: 08/10 0701 - 08/11 0700 In: -  Out: 1025 [Urine:1025]  Intake/Output this shift: Total I/O In: -  Out: 175 [Urine:175]  Lab Results:  Recent Labs  05/13/16 1220 05/13/16 2131 05/14/16 0347  WBC 6.4 7.1 5.9  HGB 14.1 13.8 13.0  HCT 42.7 41.8 39.9  PLT 142* 143* 131*   BMET  Recent Labs  05/13/16 1220 05/14/16 0347  NA 139 140  K 4.1 3.7  CL 105 106  CO2 28 24  GLUCOSE 120* 93  BUN 12 9  CREATININE 1.14 1.00  CALCIUM 9.2 8.9   LFT  Recent Labs  05/13/16 1220 05/14/16 0347  PROT 6.9 6.1*  ALBUMIN 4.2 3.6  AST 23 18  ALT 15* 13*  ALKPHOS 49 43  BILITOT 0.6 0.7   PT/INR  Recent Labs  05/13/16 1220  LABPROT 13.5  INR 1.03   Hepatitis Panel No results for input(s): HEPBSAG, HCVAB, HEPAIGM, HEPBIGM in the last 72 hours.  Studies/Results: Ct Abdomen Pelvis W Contrast  Result Date: 05/13/2016 CLINICAL DATA:  Rectal bleeding  started today. Pt denies abd pain, n/v/d or constipation. EXAM: CT ABDOMEN AND PELVIS WITH CONTRAST TECHNIQUE: Multidetector CT imaging of the abdomen and pelvis was performed using the standard protocol following bolus administration of intravenous contrast. CONTRAST:  179mL ISOVUE-300 IOPAMIDOL (ISOVUE-300) INJECTION 61% COMPARISON:  CT, 09/24/2015 FINDINGS: Lung bases:  Clear.  Heart normal in size. Hepatobiliary: 2.3 cm cyst at the dome of the liver. Sub cm low-density lesion in the posterior aspect of the posterior segment right lobe, also likely a cyst. Both lesions are stable. No other liver abnormality. Dense, 1.9 cm gallstone, stable. No gallbladder wall thickening or evidence of acute cholecystitis. No bile duct dilation. Spleen, pancreas, adrenal glands:  Normal. Kidneys, ureters, bladder:  Normal. Prostate gland:  Mildly enlarged measuring 4.2 x 3.5 x 3.7 cm. Lymph nodes:  No adenopathy. Vascular: Scattered atherosclerotic calcifications noted throughout the abdominal aorta and its branch vessels. Ascites: None. Gastrointestinal: Stomach and small bowel are unremarkable. Colon is unremarkable. Normal appendix visualized. Musculoskeletal: Degenerative changes of the visualized spine. Bones are demineralized. IMPRESSION: 1. No acute findings in the abdomen pelvis. 2. No findings to explain rectal bleeding. 3. Stable single gallstone.  Stable liver cysts. Electronically Signed  By: Lajean Manes M.D.   On: 05/13/2016 15:02    ASSESMENT:   *  Painless hematochezia.  No anemia  *   Thrombocytopenia, non-critical. No cirrhosis, just liver cysts on CT.     PLAN   *  Colonoscopy today at noon.  Did have clears as of 0915 this AM but now NPO.        Azucena Freed  05/14/2016, 9:30 AM Pager: [phone]

## 2016-05-14 NOTE — Anesthesia Procedure Notes (Signed)
Procedure Name: MAC Date/Time: 05/14/2016 1:10 PM Performed by: Carney Living Pre-anesthesia Checklist: Patient identified, Emergency Drugs available, Suction available and Patient being monitored Patient Re-evaluated:Patient Re-evaluated prior to inductionOxygen Delivery Method: Nasal cannula

## 2016-05-14 NOTE — Op Note (Signed)
[facility] Patient Name: Peter Chandler Procedure Date : 05/14/2016 MRN: [id number] Attending MD: Gatha Mayer , MD Date of Birth: [birth year] CSN: [id number] Age: 75 Admit Type: Inpatient Procedure:                Colonoscopy Indications:              Evaluation of unexplained GI bleeding, Rectal                            bleeding Providers:                Gatha Mayer, MD, Kingsley Plan, RN, Elspeth Cho Tech., Technician Referring MD:              Medicines:                Monitored Anesthesia Care Complications:            No immediate complications. Estimated blood loss:                            None. Estimated Blood Loss:     Estimated blood loss: none. Procedure:                Pre-Anesthesia Assessment:                           - Prior to the procedure, a History and Physical                            was performed, and patient medications and                            allergies were reviewed. The patient's tolerance of                            previous anesthesia was also reviewed. The risks                            and benefits of the procedure and the sedation                            options and risks were discussed with the patient.                            All questions were answered, and informed consent                            was obtained. Prior Anticoagulants: The patient                            last took aspirin 1 day prior to the procedure. ASA                            Grade Assessment: II -  A patient with mild systemic                            disease. After reviewing the risks and benefits,                            the patient was deemed in satisfactory condition to                            undergo the procedure.                           After obtaining informed consent, the colonoscope                            was passed under direct vision. Throughout the    procedure, the patient's blood pressure, pulse, and                            oxygen saturations were monitored continuously. The                            EC-[id number]) scope was introduced through                            the anus and advanced to the the cecum, identified                            by appendiceal orifice and ileocecal valve. The                            colonoscopy was performed without difficulty. The                            patient tolerated the procedure well. The quality                            of the bowel preparation was excellent. The                            ileocecal valve, appendiceal orifice, and rectum                            were photographed. Scope In: 1:12:13 PM Scope Out: 1:26:25 PM Scope Withdrawal Time: 0 hours 11 minutes 4 seconds  Total Procedure Duration: 0 hours 14 minutes 12 seconds  Findings:      The perianal exam findings include hypertrophied anal papilla(e).      The digital rectal exam was normal. Pertinent negatives include normal       prostate (size, shape, and consistency).      Internal hemorrhoids were found during retroflexion.      The exam was otherwise without abnormality on direct and retroflexion       views. Impression:               - Hypertrophied anal papilla(e) found  on perianal                            exam.                           - Internal hemorrhoids.                           - The examination was otherwise normal on direct                            and retroflexion views.                           - No specimens collected. Moderate Sedation:      Please see anesthesia notes, moderate sedation not given Recommendation:           - Discharge patient to home.                           - Resume regular diet.                           - No repeat colonoscopy due to age.                           - If persistent symptoms can treat with topical                            hydrocortione cream,  if really persistent consider                            hemorrhoid banding. seems self limited thus far                           - Continue present medications. Procedure Code(s):        --- Professional ---                           [phone], Colonoscopy, flexible; diagnostic, including                            collection of specimen(s) by brushing or washing,                            when performed (separate procedure) Diagnosis Code(s):        --- Professional ---                           K62.89, Other specified diseases of anus and rectum                           K64.8, Other hemorrhoids                           K92.2, Gastrointestinal hemorrhage, unspecified  K62.5, Hemorrhage of anus and rectum CPT copyright 2016 American Medical Association. All rights reserved. The codes documented in this report are preliminary and upon coder review may  be revised to meet current compliance requirements. Gatha Mayer, MD 05/14/2016 1:40:38 PM This report has been signed electronically. Number of Addenda: 0

## 2016-05-16 ENCOUNTER — Encounter (HOSPITAL_COMMUNITY): Payer: Self-pay | Admitting: Internal Medicine

## 2016-06-04 ENCOUNTER — Other Ambulatory Visit: Payer: Self-pay

## 2016-07-12 DIAGNOSIS — D1801 Hemangioma of skin and subcutaneous tissue: Secondary | ICD-10-CM | POA: Diagnosis not present

## 2016-07-12 DIAGNOSIS — L570 Actinic keratosis: Secondary | ICD-10-CM | POA: Diagnosis not present

## 2016-07-12 DIAGNOSIS — L821 Other seborrheic keratosis: Secondary | ICD-10-CM | POA: Diagnosis not present

## 2016-07-12 DIAGNOSIS — Z85828 Personal history of other malignant neoplasm of skin: Secondary | ICD-10-CM | POA: Diagnosis not present

## 2016-08-03 DIAGNOSIS — N400 Enlarged prostate without lower urinary tract symptoms: Secondary | ICD-10-CM | POA: Diagnosis not present

## 2016-08-03 DIAGNOSIS — R361 Hematospermia: Secondary | ICD-10-CM | POA: Diagnosis not present

## 2016-08-11 DIAGNOSIS — R361 Hematospermia: Secondary | ICD-10-CM | POA: Diagnosis not present

## 2016-09-13 ENCOUNTER — Ambulatory Visit (INDEPENDENT_AMBULATORY_CARE_PROVIDER_SITE_OTHER): Payer: Medicare Other | Admitting: Family Medicine

## 2016-09-13 ENCOUNTER — Encounter: Payer: Self-pay | Admitting: Family Medicine

## 2016-09-13 VITALS — BP 142/78 | HR 85 | Temp 101.6°F | Ht 69.0 in | Wt 182.8 lb

## 2016-09-13 DIAGNOSIS — R6889 Other general symptoms and signs: Secondary | ICD-10-CM | POA: Diagnosis not present

## 2016-09-13 LAB — POC INFLUENZA A&B (BINAX/QUICKVUE): Influenza A, POC: NEGATIVE

## 2016-09-13 MED ORDER — OSELTAMIVIR PHOSPHATE 75 MG PO CAPS: 75.0000 mg | ORAL_CAPSULE | Freq: Two times a day (BID) | ORAL | 0 refills | Status: DC

## 2016-09-13 MED ORDER — BENZONATATE 100 MG PO CAPS: 100.0000 mg | ORAL_CAPSULE | Freq: Three times a day (TID) | ORAL | 0 refills | Status: DC | PRN

## 2016-09-13 NOTE — Addendum Note (Signed)
Addended by: Agnes Lawrence on: 09/13/2016 03:15 PM   Modules accepted: Orders

## 2016-09-13 NOTE — Progress Notes (Signed)
Pre visit review using our clinic review tool, if applicable. No additional management support is needed unless otherwise documented below in the visit note. 

## 2016-09-13 NOTE — Progress Notes (Signed)
HPI:  Acute visit for:  Cough and congestion: -x 3 days -symptoms: nasal congestion, PND, sore throat, body aches, cough, fevers -no rash, tick bite, diarrhea, vomiting, SOB, inability to tol fluids, urinary symptoms, travel  -did not get flu shot  ROS: See pertinent positives and negatives per HPI.  Past Medical History:  Diagnosis Date  . Arthritis    "hands" (05/13/2016)  . Basal cell carcinoma    "face, back"  . Bleeding per rectum 05/13/2016  . BPH (benign prostatic hyperplasia) 09/2015  . Cholelithiasis 09/2015   noted on CT done by urology for his gross hematuria w/u--pt was referred to gen surg by Dr. Ralene Muskrat office  . Chronic thrombocytopenic purpura ([place])    [name] 05/13/2016  . Diplopia 2017   left eye.  glasses lens fitted with a prism  . Gross hematuria 09/2015   Painless.  cystoscopy ~ 09/2015, Dr Jeffie Pollock.  Conclusion (per pt) is source of blood is the prostate.    . Hyperlipidemia     Past Surgical History:  Procedure Laterality Date  . COLONOSCOPY  11/05/2002   Internal hemorrhoids, hyperplastic sigmoid polyp, ascending erythema, descending venous lake, hypertrophied papilla. Dr. Aurelio Jew, [place]  . COLONOSCOPY N/A 05/14/2016   Procedure: COLONOSCOPY;  Surgeon: Gatha Mayer, MD;  Location: [place];  Service: Endoscopy;  Laterality: N/A;  . CYSTOSCOPY  ~ 09/2015   to eval gross hematuria.  Dr Jeffie Pollock at [facility] urology.     Family History  Problem Relation Age of Onset  . Ovarian cancer    . Heart disease      Social History   Social History  . Marital status: Married    Spouse name: N/A  . Number of children: N/A  . Years of education: N/A   Social History Main Topics  . Smoking status: Never Smoker  . Smokeless tobacco: Never Used  . Alcohol use No  . Drug use: No  . Sexual activity: Yes   Other Topics Concern  . None   Social History Narrative  . None     Current Outpatient Prescriptions:  .  benzonatate (TESSALON PERLES) 100 MG  capsule, Take 1 capsule (100 mg total) by mouth 3 (three) times daily as needed., Disp: 20 capsule, Rfl: 0 .  oseltamivir (TAMIFLU) 75 MG capsule, Take 1 capsule (75 mg total) by mouth 2 (two) times daily., Disp: 10 capsule, Rfl: 0  EXAM:  Vitals:   09/13/16 1420  BP: (!) 142/78  Pulse: 85  Temp: (!) 101.6 F (38.7 C)    Body mass index is 26.99 kg/m.  GENERAL: vitals reviewed and listed above, alert, oriented, appears well hydrated and in no acute distress  HEENT: atraumatic, conjunttiva clear, no obvious abnormalities on inspection of external nose and ears, normal appearance of ear canals and TMs, clear nasal congestion, mild post oropharyngeal erythema with PND, no tonsillar edema or exudate, no sinus TTP  NECK: no obvious masses on inspection  LUNGS: clear to auscultation bilaterally, no wheezes, rales or rhonchi, good air movement  CV: HRRR, no peripheral edema  MS: moves all extremities without noticeable abnormality  PSYCH: pleasant and cooperative, no obvious depression or anxiety  ASSESSMENT AND PLAN:  Discussed the following assessment and plan:  Influenza-like symptoms - Plan: DG Chest 2 View, oseltamivir (TAMIFLU) 75 MG capsule  -influenza like symptoms discussed potential etiologies, potential testing, limitations flu testing, risks/benefits tamiflu -he wants tamiflu  -also will check CXR given fever in elderly with resp symptoms -  tx accordingly -Patient advised to return or notify a doctor immediately if symptoms worsen or persist or new concerns arise.  Patient Instructions  BEFORE YOU LEAVE: -xray sheet -follow up: if symptoms worsening, new concerns arise or symptoms persist depsite treatment  Go get the chest xray today  I sent tamiflu to your pharmacy   Influenza, Adult Influenza ("the flu") is an infection in the lungs, nose, and throat (respiratory tract). It is caused by a virus. The flu causes many common cold symptoms, as well as a high  fever and body aches. It can make you feel very sick. The flu spreads easily from person to person (is contagious). Getting a flu shot (influenza vaccination) every year is the best way to prevent the flu. Follow these instructions at home:  Take over-the-counter and prescription medicines only as told by your doctor.  Use a cool mist humidifier to add moisture (humidity) to the air in your home. This can make it easier to breathe.  Rest as needed.  Drink enough fluid to keep your pee (urine) clear or pale yellow.  Cover your mouth and nose when you cough or sneeze.  Wash your hands with soap and water often, especially after you cough or sneeze. If you cannot use soap and water, use hand sanitizer.  Stay home from work or school as told by your doctor. Unless you are visiting your doctor, try to avoid leaving home until your fever has been gone for 24 hours without the use of medicine.  Keep all follow-up visits as told by your doctor. This is important. How is this prevented?  Getting a yearly (annual) flu shot is the best way to avoid getting the flu. You may get the flu shot in late summer, fall, or winter. Ask your doctor when you should get your flu shot.  Wash your hands often or use hand sanitizer often.  Avoid contact with people who are sick during cold and flu season.  Eat healthy foods.  Drink plenty of fluids.  Get enough sleep.  Exercise regularly. Contact a doctor if:  You get new symptoms.  You have:  Chest pain.  Watery poop (diarrhea).  A fever.  Your cough gets worse.  You start to have more mucus.  You feel sick to your stomach (nauseous).  You throw up (vomit). Get help right away if:  You start to be short of breath or have trouble breathing.  Your skin or nails turn a bluish color.  You have very bad pain or stiffness in your neck.  You get a sudden headache.  You get sudden pain in your face or ear.  You cannot stop throwing  up. This information is not intended to replace advice given to you by your health care provider. Make sure you discuss any questions you have with your health care provider. Document Released: 06/29/2008 Document Revised: 02/26/2016 Document Reviewed: 07/15/2015 Elsevier Interactive Patient Education  2017 [place]., DO

## 2016-09-13 NOTE — Patient Instructions (Addendum)
BEFORE YOU LEAVE: -xray sheet -follow up: if symptoms worsening, new concerns arise or symptoms persist depsite treatment  Go get the chest xray today  I sent tamiflu to your pharmacy   Influenza, Adult Influenza ("the flu") is an infection in the lungs, nose, and throat (respiratory tract). It is caused by a virus. The flu causes many common cold symptoms, as well as a high fever and body aches. It can make you feel very sick. The flu spreads easily from person to person (is contagious). Getting a flu shot (influenza vaccination) every year is the best way to prevent the flu. Follow these instructions at home:  Take over-the-counter and prescription medicines only as told by your doctor.  Use a cool mist humidifier to add moisture (humidity) to the air in your home. This can make it easier to breathe.  Rest as needed.  Drink enough fluid to keep your pee (urine) clear or pale yellow.  Cover your mouth and nose when you cough or sneeze.  Wash your hands with soap and water often, especially after you cough or sneeze. If you cannot use soap and water, use hand sanitizer.  Stay home from work or school as told by your doctor. Unless you are visiting your doctor, try to avoid leaving home until your fever has been gone for 24 hours without the use of medicine.  Keep all follow-up visits as told by your doctor. This is important. How is this prevented?  Getting a yearly (annual) flu shot is the best way to avoid getting the flu. You may get the flu shot in late summer, fall, or winter. Ask your doctor when you should get your flu shot.  Wash your hands often or use hand sanitizer often.  Avoid contact with people who are sick during cold and flu season.  Eat healthy foods.  Drink plenty of fluids.  Get enough sleep.  Exercise regularly. Contact a doctor if:  You get new symptoms.  You have:  Chest pain.  Watery poop (diarrhea).  A fever.  Your cough gets  worse.  You start to have more mucus.  You feel sick to your stomach (nauseous).  You throw up (vomit). Get help right away if:  You start to be short of breath or have trouble breathing.  Your skin or nails turn a bluish color.  You have very bad pain or stiffness in your neck.  You get a sudden headache.  You get sudden pain in your face or ear.  You cannot stop throwing up. This information is not intended to replace advice given to you by your health care provider. Make sure you discuss any questions you have with your health care provider. Document Released: 06/29/2008 Document Revised: 02/26/2016 Document Reviewed: 07/15/2015 Elsevier Interactive Patient Education  2017 [employer].

## 2016-09-14 ENCOUNTER — Ambulatory Visit (INDEPENDENT_AMBULATORY_CARE_PROVIDER_SITE_OTHER)
Admission: RE | Admit: 2016-09-14 | Discharge: 2016-09-14 | Disposition: A | Discharge status: Home / Self Care | Payer: Medicare Other | Source: Ambulatory Visit | Attending: Family Medicine | Admitting: Family Medicine

## 2016-09-14 DIAGNOSIS — R6889 Other general symptoms and signs: Secondary | ICD-10-CM

## 2016-09-14 DIAGNOSIS — R05 Cough: Secondary | ICD-10-CM | POA: Diagnosis not present

## 2016-09-15 ENCOUNTER — Telehealth: Payer: Self-pay | Admitting: Family Medicine

## 2016-09-15 NOTE — Telephone Encounter (Signed)
His CXR was clear, no pneumonia was seen

## 2016-09-15 NOTE — Telephone Encounter (Signed)
I spoke with pt and went over results. 

## 2016-09-15 NOTE — Telephone Encounter (Signed)
Pt saw dr Maudie Mercury yesterday and would like xray results

## 2016-09-15 NOTE — Telephone Encounter (Signed)
Pt is calling back to r/o pneumonia.  Dr. Maudie Mercury is out of the office.  Can you advise?

## 2016-09-17 ENCOUNTER — Ambulatory Visit (INDEPENDENT_AMBULATORY_CARE_PROVIDER_SITE_OTHER): Payer: Medicare Other | Admitting: Family Medicine

## 2016-09-17 ENCOUNTER — Encounter: Payer: Self-pay | Admitting: Family Medicine

## 2016-09-17 VITALS — BP 110/80 | HR 65 | Temp 98.5°F | Ht 69.0 in | Wt 178.0 lb

## 2016-09-17 DIAGNOSIS — B349 Viral infection, unspecified: Secondary | ICD-10-CM | POA: Diagnosis not present

## 2016-09-17 NOTE — Patient Instructions (Signed)
Stay well hydrated Peter Chandler out the Tamiflu Follow up immediately for any fever or worsening symptoms.

## 2016-09-17 NOTE — Progress Notes (Signed)
Subjective:     Patient ID: Peter Chandler, male   DOB: January 14, 1941, 75 y.o.   MRN: [id number]  HPI Patient seen for follow-up regarding recent febrile illness. He was seen here early in the week with high fever up to 101.6 with body aches and cough and congestion. Flu testing was negative. He was treated empirically with Tamiflu. Overall he is some improved. He has no fever at this time. He has increased fatigue. Relatively mild cough. Sleeping more than usual. Denies any nausea, vomiting, or diarrhea. No dyspnea. Appetite improving  Past Medical History:  Diagnosis Date  . Arthritis    "hands" (05/13/2016)  . Basal cell carcinoma    "face, back"  . Bleeding per rectum 05/13/2016  . BPH (benign prostatic hyperplasia) 09/2015  . Cholelithiasis 09/2015   noted on CT done by urology for his gross hematuria w/u--pt was referred to gen surg by Dr. Ralene Muskrat office  . Chronic thrombocytopenic purpura ([place])    [name] 05/13/2016  . Diplopia 2017   left eye.  glasses lens fitted with a prism  . Gross hematuria 09/2015   Painless.  cystoscopy ~ 09/2015, Dr Jeffie Pollock.  Conclusion (per pt) is source of blood is the prostate.    . Hyperlipidemia    Past Surgical History:  Procedure Laterality Date  . COLONOSCOPY  11/05/2002   Internal hemorrhoids, hyperplastic sigmoid polyp, ascending erythema, descending venous lake, hypertrophied papilla. Dr. Aurelio Jew, [place]  . COLONOSCOPY N/A 05/14/2016   Procedure: COLONOSCOPY;  Surgeon: Gatha Mayer, MD;  Location: [place];  Service: Endoscopy;  Laterality: N/A;  . CYSTOSCOPY  ~ 09/2015   to eval gross hematuria.  Dr Jeffie Pollock at [facility] urology.     reports that he has never smoked. He has never used smokeless tobacco. He reports that he does not drink alcohol or use drugs. family history is not on file. Allergies  Allergen Reactions  . Penicillins     REACTION: anaphylaxis Has patient had a PCN reaction causing immediate rash, facial/tongue/throat swelling,  SOB or lightheadedness with hypotension: YES Has patient had a PCN reaction causing severe rash involving mucus membranes or skin necrosis: NO Has patient had a PCN reaction that required hospitalization NO Has patient had a PCN reaction occurring within the last 10 years: NO If all of the above answers are "NO", then may proceed with Cephalosporin use.   . Prednisone     hives       Review of Systems  Constitutional: Positive for fatigue. Negative for chills and fever.  HENT: Negative for sore throat.   Respiratory: Positive for cough.   Gastrointestinal: Negative for abdominal pain, nausea and vomiting.  Genitourinary: Negative for dysuria.  Neurological: Negative for weakness and headaches.       Objective:   Physical Exam  Constitutional: He appears well-developed and well-nourished.  HENT:  Right Ear: External ear normal.  Left Ear: External ear normal.  Mouth/Throat: Oropharynx is clear and moist.  Neck: Neck supple.  Cardiovascular: Normal rate and regular rhythm.   Pulmonary/Chest: Effort normal and breath sounds normal. No respiratory distress. He has no wheezes. He has no rales.  Lymphadenopathy:    He has no cervical adenopathy.  Skin: No rash noted.       Assessment:     Recent febrile illness-suspect viral. Improving    Plan:     -Finish out Tamiflu -Stay well-hydrated -Patient is encouraged to get flu vaccine by next week after he is further improved -Follow-up  immediately for a fever or worsening symptoms.  Eulas Post MD [facility] at [facility]

## 2016-10-01 DIAGNOSIS — H93293 Other abnormal auditory perceptions, bilateral: Secondary | ICD-10-CM | POA: Diagnosis not present

## 2016-10-01 DIAGNOSIS — H6123 Impacted cerumen, bilateral: Secondary | ICD-10-CM | POA: Diagnosis not present

## 2016-11-02 ENCOUNTER — Telehealth: Payer: Self-pay | Admitting: Family Medicine

## 2016-11-04 ENCOUNTER — Ambulatory Visit (INDEPENDENT_AMBULATORY_CARE_PROVIDER_SITE_OTHER): Payer: Medicare Other | Admitting: Family Medicine

## 2016-11-04 ENCOUNTER — Encounter: Payer: Self-pay | Admitting: Family Medicine

## 2016-11-04 VITALS — BP 138/82 | HR 67 | Temp 99.1°F | Ht 69.0 in | Wt 177.8 lb

## 2016-11-04 DIAGNOSIS — J209 Acute bronchitis, unspecified: Secondary | ICD-10-CM | POA: Diagnosis not present

## 2016-11-04 MED ORDER — AZITHROMYCIN 250 MG PO TABS: ORAL_TABLET | ORAL | 0 refills | Status: DC

## 2016-11-04 NOTE — Progress Notes (Signed)
   Subjective:    Patient ID: WAQAS LARGENT, male    DOB: [birth year], 76 y.o.   MRN: [id number]  HPI Here for 4 days of fever, PND, chest congestion, and a dry cough.    Review of Systems  Constitutional: Positive for fever.  HENT: Positive for congestion and postnasal drip. Negative for sinus pain, sinus pressure and sore throat.   Eyes: Negative.   Respiratory: Positive for cough.        Objective:   Physical Exam  Constitutional: He appears well-developed and well-nourished.  HENT:  Right Ear: External ear normal.  Left Ear: External ear normal.  Nose: Nose normal.  Mouth/Throat: Oropharynx is clear and moist.  Eyes: Conjunctivae are normal.  Neck: No thyromegaly present.  Pulmonary/Chest: Effort normal. No respiratory distress. He has no wheezes. He has no rales.  Scattered rhonchi   Lymphadenopathy:    He has no cervical adenopathy.          Assessment & Plan:  Bronchitis, treat with a Zpack. Add Mucinex prn.  Alysia Penna, MD

## 2016-11-22 ENCOUNTER — Telehealth: Payer: Self-pay

## 2016-11-22 NOTE — Telephone Encounter (Signed)
Call to ms. Peter Chandler regarding AWV. States her spouse has been sick with the flu 3 times and is having a hard time getting well.  Asked if I can call back at the end of April or first of May and she stated that would be much better.

## 2016-12-08 DIAGNOSIS — H2513 Age-related nuclear cataract, bilateral: Secondary | ICD-10-CM | POA: Diagnosis not present

## 2016-12-08 DIAGNOSIS — H4912 Fourth [trochlear] nerve palsy, left eye: Secondary | ICD-10-CM | POA: Diagnosis not present

## 2017-02-15 DIAGNOSIS — N400 Enlarged prostate without lower urinary tract symptoms: Secondary | ICD-10-CM | POA: Diagnosis not present

## 2017-02-15 NOTE — Telephone Encounter (Signed)
Called pt about scheduling AWV - left message.

## 2017-02-23 ENCOUNTER — Other Ambulatory Visit: Payer: Self-pay | Admitting: Urology

## 2017-02-23 DIAGNOSIS — R361 Hematospermia: Secondary | ICD-10-CM | POA: Diagnosis not present

## 2017-02-23 DIAGNOSIS — R310 Gross hematuria: Secondary | ICD-10-CM | POA: Diagnosis not present

## 2017-03-10 ENCOUNTER — Ambulatory Visit (HOSPITAL_COMMUNITY): Payer: Medicare Other

## 2017-03-15 ENCOUNTER — Ambulatory Visit (HOSPITAL_COMMUNITY)
Admission: RE | Admit: 2017-03-15 | Discharge: 2017-03-15 | Disposition: A | Discharge status: Home / Self Care | Payer: Medicare Other | Source: Ambulatory Visit | Attending: Urology | Admitting: Urology

## 2017-03-15 DIAGNOSIS — R361 Hematospermia: Secondary | ICD-10-CM | POA: Diagnosis not present

## 2017-03-15 DIAGNOSIS — N429 Disorder of prostate, unspecified: Secondary | ICD-10-CM | POA: Diagnosis not present

## 2017-03-15 DIAGNOSIS — C61 Malignant neoplasm of prostate: Secondary | ICD-10-CM | POA: Diagnosis not present

## 2017-03-15 LAB — POCT I-STAT CREATININE: CREATININE: 1.2 mg/dL (ref 0.61–1.24)

## 2017-03-15 MED ORDER — GADOBENATE DIMEGLUMINE 529 MG/ML IV SOLN
20.0000 mL | Freq: Once | INTRAVENOUS | Status: AC | PRN
  Administered 2017-03-15: 16 mL via INTRAVENOUS

## 2017-03-23 DIAGNOSIS — D400 Neoplasm of uncertain behavior of prostate: Secondary | ICD-10-CM | POA: Diagnosis not present

## 2017-03-23 DIAGNOSIS — R310 Gross hematuria: Secondary | ICD-10-CM | POA: Diagnosis not present

## 2017-05-04 DIAGNOSIS — D400 Neoplasm of uncertain behavior of prostate: Secondary | ICD-10-CM | POA: Diagnosis not present

## 2017-05-04 DIAGNOSIS — R972 Elevated prostate specific antigen [PSA]: Secondary | ICD-10-CM | POA: Diagnosis not present

## 2017-06-08 ENCOUNTER — Ambulatory Visit (INDEPENDENT_AMBULATORY_CARE_PROVIDER_SITE_OTHER): Payer: Medicare Other | Admitting: Family Medicine

## 2017-06-08 ENCOUNTER — Encounter: Payer: Self-pay | Admitting: Family Medicine

## 2017-06-08 VITALS — BP 134/85 | HR 73 | Temp 98.4°F | Ht 69.0 in | Wt 175.0 lb

## 2017-06-08 DIAGNOSIS — R1011 Right upper quadrant pain: Secondary | ICD-10-CM | POA: Diagnosis not present

## 2017-06-08 NOTE — Patient Instructions (Signed)
WE NOW OFFER   [facility] FAST TRACK!!!  SAME DAY Appointments for ACUTE CARE  Such as: Sprains, Injuries, cuts, abrasions, rashes, muscle pain, joint pain, back pain Colds, flu, sore throats, headache, allergies, cough, fever  Ear pain, sinus and eye infections Abdominal pain, nausea, vomiting, diarrhea, upset stomach Animal/insect bites  3 Easy Ways to Schedule: Walk-In Scheduling Call in scheduling Mychart Sign-up: [email or web address]         

## 2017-06-08 NOTE — Progress Notes (Signed)
   Subjective:    Patient ID: Peter Chandler, male    DOB: 12/31/1940, 76 y.o.   MRN: [id number]  HPI Here for 6 months of intermittent RUQ abdominal pains that have become more frequent lately. These often start shortly after a meal. The pains last several hours and then fade away. He often gets nauseated with them but he has not vomited. No fever. No heartburn. His BMs are regular. He had an unremarkable colonoscopy in August 2017. He also had a CT scan in August 2017 showing a 1.9 cm gall stone without any signs of acute inflammation.    Review of Systems  Constitutional: Negative.   Respiratory: Negative.   Cardiovascular: Negative.   Gastrointestinal: Positive for abdominal pain and nausea. Negative for abdominal distention, anal bleeding, blood in stool, constipation, diarrhea, rectal pain and vomiting.  Genitourinary: Negative.        Objective:   Physical Exam  Constitutional: He is oriented to person, place, and time. He appears well-developed and well-nourished. No distress.  Cardiovascular: Normal rate, regular rhythm, normal heart sounds and intact distal pulses.   Pulmonary/Chest: Effort normal and breath sounds normal. No respiratory distress. He has no wheezes. He has no rales.  Abdominal: Soft. Bowel sounds are normal. He exhibits no distension and no mass. There is no rebound and no guarding.  Mildly tender in the RUQ  Neurological: He is alert and oriented to person, place, and time.          Assessment & Plan:  RUQ pains that are mostly likely related to the gall bladder. We will set him up for an abdominal US soon. In the meantime he will try taking Prilosec OTC daily in case this was due to duodenitis.  Alysia Penna, MD

## 2017-06-14 ENCOUNTER — Ambulatory Visit
Admission: RE | Admit: 2017-06-14 | Discharge: 2017-06-14 | Disposition: A | Discharge status: Home / Self Care | Payer: Medicare Other | Source: Ambulatory Visit | Attending: Family Medicine | Admitting: Family Medicine

## 2017-06-14 DIAGNOSIS — K8020 Calculus of gallbladder without cholecystitis without obstruction: Secondary | ICD-10-CM | POA: Diagnosis not present

## 2017-06-14 DIAGNOSIS — R1011 Right upper quadrant pain: Secondary | ICD-10-CM

## 2017-06-15 NOTE — Addendum Note (Signed)
Addended by: Alysia Penna A on: 06/15/2017 04:23 PM   Modules accepted: Orders

## 2017-06-23 ENCOUNTER — Encounter: Payer: Self-pay | Admitting: Family Medicine

## 2017-07-08 ENCOUNTER — Ambulatory Visit: Payer: Self-pay | Admitting: Surgery

## 2017-07-08 DIAGNOSIS — K8010 Calculus of gallbladder with chronic cholecystitis without obstruction: Secondary | ICD-10-CM | POA: Diagnosis not present

## 2017-07-08 DIAGNOSIS — G8929 Other chronic pain: Secondary | ICD-10-CM | POA: Diagnosis not present

## 2017-07-08 DIAGNOSIS — M545 Low back pain: Secondary | ICD-10-CM | POA: Diagnosis not present

## 2017-07-08 NOTE — H&P (Signed)
Peter Chandler 07/08/2017 2:10 PM Location: [facility] Patient #: [id number] DOB: 1941-06-18 Married / Language: English / Race: White Male  History of Present Illness [name] MD; 07/08/2017 3:16 PM) The patient is a 76 year old male who presents for evaluation of gall stones. Note for "Gall stones": The patient sent by his urologist, Dr. Irine Seal, for incidental gallstones and right-sided abdominal pain.  Pleasant active male returns. Comes today with his wife. Has had some intermittent discomfort on his RIGHT posterior flank since 2016. He was sent to me for consultation in January 2017. His pain had been relatively mild. He had some hematuria. Saw urology. CT scan showed large calcified gallstone. Possibility of gallbladder etiology for his pain considered. Surgical consultation requested. I saw him in Jan 2017. He did not have biliary colic. His pain seemed to be on his right posterior flank. I did not feel that he strongly needed cholecystectomy. They agreed and wished to hold off.   He had some hematochezia and some abdominal complaints. Gastrology was consulted. Colonoscopy was negative. Patient notes he's had persistent pain in his right abdomen with intermittent flares. He's had nausea and bloating associated with this. He's not convinced its necessarily related to eating but can happen within 2 hours, especially at night. No regular exercise. He is otherwise rather physically active. He saw his primary care physician. He was placed on proton pump maneuvers. He's not noticed any difference in the past 2 weeks. Occasionally takes some chewable antiacid medications for mild bloating but nothing severe. He has bowel movement every day. Denies any abdominal surgeries.  No personal nor family history of GI/colon cancer, inflammatory bowel disease, irritable bowel syndrome, allergy such as Celiac Sprue, dietary/dairy problems, colitis, ulcers nor  gastritis. No recent sick contacts/gastroenteritis. No travel outside the country. No changes in diet. No dysphagia to solids or liquids. No significant heartburn or reflux. No hematochezia, hematemesis, coffee ground emesis. No evidence of prior gastric/peptic ulceration. No history of hepatitis. No pancreatitis. No pneumonia. No pleuritic chest pain. No hematuria. No kidney stones. No urinary tract infections   Allergies Malachy Moan, RMA; 07/08/2017 2:19 PM) PredniSONE *CORTICOSTEROIDS* Hives. Penicillin V *PENICILLINS* Anaphylaxis.  Medication History Malachy Moan, [place]; 07/08/2017 2:18 PM) No Current Medications Medications Reconciled     Review of Systems [name], MD; 07/08/2017 2:39 PM) General Not Present- Appetite Loss, Chills, Fatigue, Fever, Night Sweats, Weight Gain and Weight Loss. Skin Not Present- Change in Wart/Mole, Dryness, Hives, Jaundice, New Lesions, Non-Healing Wounds, Rash and Ulcer. HEENT Present- Visual Disturbances and Wears glasses/contact lenses. Not Present- Earache, Hearing Loss, Hoarseness, Nose Bleed, Oral Ulcers, Ringing in the Ears, Seasonal Allergies, Sinus Pain, Sore Throat and Yellow Eyes. Respiratory Present- Snoring. Not Present- Bloody sputum, Chronic Cough, Difficulty Breathing and Wheezing. Breast Not Present- Breast Mass, Breast Pain, Nipple Discharge and Skin Changes. Cardiovascular Present- Leg Cramps. Not Present- Chest Pain, Difficulty Breathing Lying Down, Palpitations, Rapid Heart Rate, Shortness of Breath and Swelling of Extremities. Male Genitourinary Present- Blood in Urine and Frequency. Not Present- Change in Urinary Stream, Impotence, Nocturia, Painful Urination, Urgency and Urine Leakage. Musculoskeletal Present- Back Pain and Joint Stiffness. Not Present- Joint Pain, Muscle Pain, Muscle Weakness and Swelling of Extremities. Hematology Present- Easy Bruising. Not Present- Excessive bleeding, Gland  problems, HIV and Persistent Infections.  Vitals Malachy Moan RMA; 07/08/2017 2:19 PM) 07/08/2017 2:19 PM Weight: 175 lb Height: 69in Body Surface Area: 1.95 m Body Mass Index: 25.84 kg/m  Temp.: 63F  Pulse: 79 (Regular)  BP: 130/82 (Sitting, Left Arm, Standard)      Physical Exam [name] MD; 07/08/2017 3:11 PM)  General Mental Status-Alert. General Appearance-Not in acute distress, Not Sickly. Orientation-Oriented X3. Hydration-Well hydrated. Voice-Normal.  Integumentary Global Assessment Normal Exam - Axillae: non-tender, no inflammation or ulceration, no drainage. and Distribution of scalp and body hair is normal. General Characteristics Temperature - normal warmth is noted.  Head and Neck Head-normocephalic, atraumatic with no lesions or palpable masses. Face Global Assessment - atraumatic, no absence of expression. Neck Global Assessment - no abnormal movements, no bruit auscultated on the right, no bruit auscultated on the left, no decreased range of motion, non-tender. Trachea-midline. Thyroid Gland Characteristics - non-tender.  Eye Eyeball - Left-Extraocular movements intact, No Nystagmus. Eyeball - Right-Extraocular movements intact, No Nystagmus. Cornea - Left-No Hazy. Cornea - Right-No Hazy. Sclera/Conjunctiva - Left-No scleral icterus, No Discharge. Sclera/Conjunctiva - Right-No scleral icterus, No Discharge. Pupil - Left-Direct reaction to light normal. Pupil - Right-Direct reaction to light normal.  ENMT Ears Pinna - Left - no drainage observed, no generalized tenderness observed. Right - no drainage observed, no generalized tenderness observed. Nose and Sinuses Nose - no destructive lesion observed. Nares - Left - quiet respiration. Right - quiet respiration. Mouth and Throat Lips - Upper Lip - no fissures observed, no pallor noted. Lower Lip - no fissures observed, no pallor noted.  Nasopharynx - no discharge present. Oral Cavity/Oropharynx - Tongue - no dryness observed. Oral Mucosa - no cyanosis observed. Hypopharynx - no evidence of airway distress observed.  Chest and Lung Exam Inspection Movements - Normal and Symmetrical. Accessory muscles - No use of accessory muscles in breathing. Palpation Normal exam - Non-tender. Auscultation Breath sounds - Normal and Clear. Note: No pain on sternal / chest wall compression  Cardiovascular Auscultation Rhythm - Regular. Murmurs & Other Heart Sounds - Normal exam - No Murmurs and No Systolic Clicks.  Abdomen Inspection Normal Exam - No Visible peristalsis and No Abnormal pulsations. Umbilicus - No Bleeding, No Urine drainage. Palpation/Percussion Normal exam - Soft, Non Tender, No Rebound tenderness, No Rigidity (guarding) and No Cutaneous hyperesthesia. Note: Mild R sided abdominal pain, slightly lower than the typical subcostal location. No Murphy sign. Mild diastases recti. No umbilical hernia.  Male Genitourinary Sexual Maturity Tanner 5 - Adult hair pattern and Adult penile size and shape. Note: Normal external genitalia. Epididymi, testes, and spermatic cords normal without any masses. No inguinal hernias.  Peripheral Vascular Upper Extremity Inspection - Left - No Cyanotic nailbeds, Not Ischemic. Right - No Cyanotic nailbeds, Not Ischemic.  Neurologic Neurologic evaluation reveals -normal attention span and ability to concentrate, able to name objects and repeat phrases. Appropriate fund of knowledge , normal sensation and normal coordination. Mental Status Affect - not angry, not paranoid. Cranial Nerves-Normal Bilaterally. Gait-Normal.  Neuropsychiatric Mental status exam performed with findings of-able to articulate well with normal speech/language, rate, volume and coherence, thought content normal with ability to perform basic computations and apply abstract reasoning and no evidence  of hallucinations, delusions, obsessions or homicidal/suicidal ideation.  Musculoskeletal Global Assessment Spine, Ribs and Pelvis - no instability, subluxation or laxity. Right Upper Extremity - no instability, subluxation or laxity. Note: No posterior flank pain.  Lymphatic Head & Neck General Head & Neck Lymphatics: Bilateral - Description - No Localized lymphadenopathy. Axillary General Axillary Region: Bilateral - Description - No Localized lymphadenopathy. Femoral & Inguinal Generalized Femoral & Inguinal Lymphatics: Left: Right - Description - No Localized  lymphadenopathy. Description - No Localized lymphadenopathy.    Assessment & Plan [name] MD; 07/08/2017 3:13 PM)  CHRONIC CHOLECYSTITIS WITH CALCULUS (K80.10) Impression: Moderate-sized calcified gallstones with persistent right-sided abdominal pain. Now chronic with intermittent flares. Also intermittent bloating and nausea. Usually the most intense at night. Getting worse. Happening more often. Getting more intense. I cannot tease out a definite biliary colic but very suspicious.  No improvement with proton pump inhibitors. The rest of the differential diagnosis is underwhelming. Normal colonoscopy last year and underwhelming gastroenterology consultation. I offered cholecystectomy. They're interested in proceeding.  Current Plans You are being scheduled for surgery- Our schedulers will call you.  You should hear from our office's scheduling department within 5 working days about the location, date, and time of surgery. We try to make accommodations for patient's preferences in scheduling surgery, but sometimes the OR schedule or the surgeon's schedule prevents [place] from making those accommodations.  If you have not heard from our office [phone]) in 5 working days, call the office and ask for your surgeon's nurse.  If you have other questions about your diagnosis, plan, or surgery, call the office and ask  for your surgeon's nurse.  Written instructions provided Pt Education - Pamphlet Given - Laparoscopic Gallbladder Surgery: discussed with patient and provided information. The anatomy & physiology of hepatobiliary & pancreatic function was discussed. The pathophysiology of gallbladder dysfunction was discussed. Natural history risks without surgery was discussed. I feel the risks of no intervention will lead to serious problems that outweigh the operative risks; therefore, I recommended cholecystectomy to remove the pathology. I explained laparoscopic techniques with possible need for an open approach. Probable cholangiogram to evaluate the bilary tract was explained as well.  Risks such as bleeding, infection, abscess, leak, injury to other organs, need for further treatment, heart attack, death, and other risks were discussed. I noted a good likelihood this will help address the problem. Possibility that this will not correct all abdominal symptoms was explained. Goals of post-operative recovery were discussed as well. We will work to minimize complications. An educational handout further explaining the pathology and treatment options was given as well. Questions were answered. The patient expresses understanding & wishes to proceed with surgery.  Pt Education - [facility] Laparosopic Post Op HCI (Sami Froh) Pt Education - [facility] Good Bowel Health (Kellyann Ordway) Pt Education - Laparoscopic Cholecystectomy: gallbladder CHRONIC RIGHT-SIDED LOW BACK PAIN WITHOUT SCIATICA (M54.5) Impression: Mild RIGHT lumbar back discomfort. Suspect muscular skeletal strain. Question of radicular radiation to the RIGHT side without any evidence of hernia or other abnormality.  Consider trial of anti-inflammatories and heat for the next 3-6 weeks. See if that'll calm down on its own. Recent CT scanning argues against any major anatomical abnormalities.  Symptoms are so mild he doesn't do anything about it. They feel  reassured. If things markedly worsen, consider evaluation with spine surgeon.  Current Plans Pt Education - [facility] Pain Control (Jourdyn Hasler)  [name], M.D., F.A.C.S. Gastrointestinal and Minimally Invasive Surgery [facility] [address], [place], [place] [address] [phone] Main / Paging

## 2017-07-21 ENCOUNTER — Telehealth: Payer: Self-pay | Admitting: Cardiology

## 2017-07-21 NOTE — Telephone Encounter (Signed)
Received incoming records from [facility] for upcoming appointment on 07/25/17 @ 7:40am with Dr. Percival Spanish. Records given to [facility] in Medical Records. 07/21/17 ab

## 2017-07-22 ENCOUNTER — Encounter: Payer: Self-pay | Admitting: Cardiology

## 2017-07-24 NOTE — Progress Notes (Signed)
Cardiology Office Note   Date:  07/25/2017   ID:  Peter, Chandler May 25, 1941, MRN [id number]  PCP:  Laurey Morale, MD  Cardiologist:   Minus Breeding, MD  Referring:  Michael Boston, MD   Chief Complaint  Patient presents with  . Pre-op Exam      History of Present Illness: Peter Chandler is a 76 y.o. male who is referred by Michael Boston, MD for evaluation preop prior to gallbladder surgery.  He has a history of being told that he had a "slow" heart valve. He has taken antibiotics before the dentist in the past.  However, he has had no echo.  He had a normal stress test in the past.  He is very active and can do heavy chores and vacuum without symptoms.  The patient denies any new symptoms such as chest discomfort, neck or arm discomfort. There has been no new shortness of breath, PND or orthopnea. There have been no reported palpitations, presyncope or syncope.    Past Medical History:  Diagnosis Date  . Arthritis    "hands" (05/13/2016)  . Basal cell carcinoma    "face, back"  . Bleeding per rectum 05/13/2016  . BPH (benign prostatic hyperplasia) 09/2015  . Cholelithiasis 09/2015   noted on CT done by urology for his gross hematuria w/u--pt was referred to gen surg by Dr. Ralene Muskrat office  . Chronic thrombocytopenic purpura ([place])    [name] 05/13/2016  . Diplopia 2017   left eye.  glasses lens fitted with a prism  . Gross hematuria 09/2015   Painless.  cystoscopy ~ 09/2015, Dr Jeffie Pollock.  Conclusion (per pt) is source of blood is the prostate.    . Hyperlipidemia     Past Surgical History:  Procedure Laterality Date  . COLONOSCOPY  11/05/2002   Internal hemorrhoids, hyperplastic sigmoid polyp, ascending erythema, descending venous lake, hypertrophied papilla. Dr. Aurelio Jew, [place]  . COLONOSCOPY N/A 05/14/2016   per Dr. Carlean Purl, hypertrophied anal papillae but no polyps, no repeats needed   . CYSTOSCOPY  ~ 09/2015   to eval gross hematuria.  Dr Jeffie Pollock at [facility]  urology.      Current Outpatient Prescriptions  Medication Sig Dispense Refill  . azithromycin (ZITHROMAX) 250 MG tablet Take 500 mg by mouth once. Take 2 tablets (500 mg total) by mouth for dental procedures.     No current facility-administered medications for this visit.     Allergies:   Penicillins and Prednisone    Social History:  The patient  reports that he has never smoked. He has never used smokeless tobacco. He reports that he does not drink alcohol or use drugs.   Family History:  The patient's family history includes Heart disease in his unknown relative; Heart disease (age of onset: 61) in his father; Ovarian cancer in his unknown relative.    ROS:  Please see the history of present illness.   Otherwise, review of systems are positive for none.   All other systems are reviewed and negative.    PHYSICAL EXAM: VS:  BP 130/81 (BP Location: Right Arm, Patient Position: Sitting, Cuff Size: Normal)   Pulse 67   Ht 5\' 8"  (1.727 m)   Wt 175 lb 9.6 oz (79.7 kg)   BMI 26.70 kg/m  , BMI Body mass index is 26.7 kg/m. GENERAL:  Well appearing HEENT:  Pupils equal round and reactive, fundi not visualized, oral mucosa unremarkable NECK:  No jugular venous distention, waveform within  normal limits, carotid upstroke brisk and symmetric, no bruits, no thyromegaly LYMPHATICS:  No cervical, inguinal adenopathy LUNGS:  Clear to auscultation bilaterally BACK:  No CVA tenderness CHEST:  Unremarkable HEART:  PMI not displaced or sustained,S1 and S2 within normal limits, no S3, no S4, no clicks, no rubs, no murmurs ABD:  Flat, positive bowel sounds normal in frequency in pitch, no bruits, no rebound, no guarding, no midline pulsatile mass, no hepatomegaly, no splenomegaly EXT:  2 plus pulses throughout, no edema, no cyanosis no clubbing SKIN:  No rashes no nodules NEURO:  Cranial nerves II through XII grossly intact, motor grossly intact throughout PSYCH:  Cognitively intact, oriented  to person place and time    EKG:  EKG is ordered today. Sinus rhythm, rate 66, axis within normal limits, intervals within normal limits, no acute ST-T wave changes.   Recent Labs: 03/15/2017: Creatinine, Ser 1.20    Lipid Panel    Component Value Date/Time   CHOL 194 05/18/2011 0836   TRIG 63.0 05/18/2011 0836   HDL 60.50 05/18/2011 0836   CHOLHDL 3 05/18/2011 0836   VLDL 12.6 05/18/2011 0836   LDLCALC 121 (H) 05/18/2011 0836   LDLDIRECT 181.1 01/14/2011 0947      Wt Readings from Last 3 Encounters:  07/25/17 175 lb 9.6 oz (79.7 kg)  06/08/17 175 lb (79.4 kg)  11/04/16 177 lb 12.8 oz (80.6 kg)      Other studies Reviewed: Additional studies/ records that were reviewed today include: None. Review of the above records demonstrates:  Please see elsewhere in the note.     ASSESSMENT AND PLAN:    PREOP:  The patient has a high functional level.  He is not going for a high risk procedure.  He has no high risk findings by exam or history.  The patient is not going for a high risk surgery.   Per modified Lee criteria the estimated rate of myocardial infarction, pulmonary embolism, ventricular fibrillation, cardiac arrest or complete heart block for this patient is less than  0.9%.  Therefore, based on ACC/AHA guidelines, the patient would be at acceptable risk for the planned procedure without further cardiovascular testing.  ABNORMAL HEART VALVE:  I see and hear no evidence of this.  No further testing is indicated. No SBE prophylaxis is needed.    CAROTID STENOSIS:  The patient had mild carotid plaque in 2016.  No follow up is indicated at this point.  Current medicines are reviewed at length with the patient today.  The patient does not have concerns regarding medicines.  The following changes have been made:  no change  Labs/ tests ordered today include: None  Orders Placed This Encounter  Procedures  . EKG 12-Lead     Disposition:   FU with me as needed.       Signed, Minus Breeding, MD  07/25/2017 8:32 AM    [facility]

## 2017-07-25 ENCOUNTER — Encounter: Payer: Self-pay | Admitting: Cardiology

## 2017-07-25 ENCOUNTER — Ambulatory Visit (INDEPENDENT_AMBULATORY_CARE_PROVIDER_SITE_OTHER): Payer: Medicare Other | Admitting: Cardiology

## 2017-07-25 VITALS — BP 130/81 | HR 67 | Ht 68.0 in | Wt 175.6 lb

## 2017-07-25 DIAGNOSIS — R011 Cardiac murmur, unspecified: Secondary | ICD-10-CM | POA: Diagnosis not present

## 2017-07-25 DIAGNOSIS — Z01810 Encounter for preprocedural cardiovascular examination: Secondary | ICD-10-CM | POA: Insufficient documentation

## 2017-07-25 NOTE — Patient Instructions (Signed)
Your physician recommends that you schedule a follow-up appointment in: as needed  

## 2017-08-05 NOTE — Patient Instructions (Addendum)
Peter Chandler  08/05/2017   Your procedure is scheduled on: 08-10-17  Report to [facility] Main  Entrance Take [place]  elevators to 3rd floor to  [place] at [facility].   Call this number if you have problems the morning of surgery [phone]    Remember: ONLY 1 PERSON MAY GO WITH YOU TO SHORT STAY TO GET  READY MORNING OF YOUR SURGERY.  Do not eat food or drink liquids :After Midnight.     Take these medicines the morning of surgery with A SIP OF WATER: NONE                                You may not have any metal on your body including hair pins and              piercings  Do not wear jewelry, make-up, lotions, powders or perfumes, deodorant              Men may shave face and neck.   Do not bring valuables to the hospital. [place].  Contacts, dentures or bridgework may not be worn into surgery.      Patients discharged the day of surgery will not be allowed to drive home.  Name and phone number of your driver:  Special Instructions: N/A              Please read over the following fact sheets you were given: _____________________________________________________________________            [facility] - Preparing for Surgery Before surgery, you can play an important role.  Because skin is not sterile, your skin needs to be as free of germs as possible.  You can reduce the number of germs on your skin by washing with CHG (chlorahexidine gluconate) soap before surgery.  CHG is an antiseptic cleaner which kills germs and bonds with the skin to continue killing germs even after washing. Please DO NOT use if you have an allergy to CHG or antibacterial soaps.  If your skin becomes reddened/irritated stop using the CHG and inform your nurse when you arrive at Short Stay. Do not shave (including legs and underarms) for at least 48 hours prior to the first CHG shower.  You may shave your face/neck. Please  follow these instructions carefully:  1.  Shower with CHG Soap the night before surgery and the  morning of Surgery.  2.  If you choose to wash your hair, wash your hair first as usual with your  normal  shampoo.  3.  After you shampoo, rinse your hair and body thoroughly to remove the  shampoo.                           4.  Use CHG as you would any other liquid soap.  You can apply chg directly  to the skin and wash                       Gently with a scrungie or clean washcloth.  5.  Apply the CHG Soap to your body ONLY FROM THE NECK DOWN.   Do not use on face/ open  Wound or open sores. Avoid contact with eyes, ears mouth and genitals (private parts).                       Wash face,  Genitals (private parts) with your normal soap.             6.  Wash thoroughly, paying special attention to the area where your surgery  will be performed.  7.  Thoroughly rinse your body with warm water from the neck down.  8.  DO NOT shower/wash with your normal soap after using and rinsing off  the CHG Soap.                9.  Pat yourself dry with a clean towel.            10.  Wear clean pajamas.            11.  Place clean sheets on your bed the night of your first shower and do not  sleep with pets. Day of Surgery : Do not apply any lotions/deodorants the morning of surgery.  Please wear clean clothes to the hospital/surgery center.  FAILURE TO FOLLOW THESE INSTRUCTIONS MAY RESULT IN THE CANCELLATION OF YOUR SURGERY PATIENT SIGNATURE_________________________________  NURSE SIGNATURE__________________________________  ________________________________________________________________________

## 2017-08-05 NOTE — Progress Notes (Addendum)
LOV/cardiac clearance Dr Minus Breeding 07-25-17 epic   EKG 07-25-17 epic   CXR 09-14-16 epic

## 2017-08-08 ENCOUNTER — Encounter (HOSPITAL_COMMUNITY): Payer: Self-pay

## 2017-08-08 ENCOUNTER — Encounter (HOSPITAL_COMMUNITY)
Admission: RE | Admit: 2017-08-08 | Discharge: 2017-08-08 | Disposition: A | Discharge status: Home / Self Care | Payer: Medicare Other | Source: Ambulatory Visit | Attending: Surgery | Admitting: Surgery

## 2017-08-08 ENCOUNTER — Other Ambulatory Visit: Payer: Self-pay

## 2017-08-08 DIAGNOSIS — E785 Hyperlipidemia, unspecified: Secondary | ICD-10-CM | POA: Diagnosis not present

## 2017-08-08 DIAGNOSIS — Z880 Allergy status to penicillin: Secondary | ICD-10-CM | POA: Diagnosis not present

## 2017-08-08 DIAGNOSIS — Z79899 Other long term (current) drug therapy: Secondary | ICD-10-CM | POA: Diagnosis not present

## 2017-08-08 DIAGNOSIS — K8010 Calculus of gallbladder with chronic cholecystitis without obstruction: Secondary | ICD-10-CM | POA: Diagnosis not present

## 2017-08-08 DIAGNOSIS — Z85828 Personal history of other malignant neoplasm of skin: Secondary | ICD-10-CM | POA: Diagnosis not present

## 2017-08-08 DIAGNOSIS — K7581 Nonalcoholic steatohepatitis (NASH): Secondary | ICD-10-CM | POA: Diagnosis not present

## 2017-08-08 DIAGNOSIS — N400 Enlarged prostate without lower urinary tract symptoms: Secondary | ICD-10-CM | POA: Diagnosis not present

## 2017-08-08 LAB — CBC
HCT: 39.6 % (ref 39.0–52.0)
HEMOGLOBIN: 13.1 g/dL (ref 13.0–17.0)
MCH: 30.6 pg (ref 26.0–34.0)
MCHC: 33.1 g/dL (ref 30.0–36.0)
MCV: 92.5 fL (ref 78.0–100.0)
PLATELETS: 144 10*3/uL — AB (ref 150–400)
RBC: 4.28 MIL/uL (ref 4.22–5.81)
RDW: 14.2 % (ref 11.5–15.5)
WBC: 5.8 10*3/uL (ref 4.0–10.5)

## 2017-08-08 LAB — BASIC METABOLIC PANEL
ANION GAP: 8 (ref 5–15)
BUN: 16 mg/dL (ref 6–20)
CALCIUM: 8.8 mg/dL — AB (ref 8.9–10.3)
CO2: 26 mmol/L (ref 22–32)
Chloride: 104 mmol/L (ref 101–111)
Creatinine, Ser: 1.16 mg/dL (ref 0.61–1.24)
GFR, EST NON AFRICAN AMERICAN: 58 mL/min — AB (ref >60)
Glucose, Bld: 97 mg/dL (ref 65–99)
Potassium: 4.7 mmol/L (ref 3.5–5.1)
SODIUM: 138 mmol/L (ref 135–145)

## 2017-08-09 NOTE — Progress Notes (Signed)
LVMM for patient with time change of 0945arrival and surgery at 1145am.  Instructed patient to call me back to know he received message.

## 2017-08-10 ENCOUNTER — Ambulatory Visit (HOSPITAL_COMMUNITY): Payer: Medicare Other | Admitting: Anesthesiology

## 2017-08-10 ENCOUNTER — Ambulatory Visit (HOSPITAL_COMMUNITY)
Admission: RE | Admit: 2017-08-10 | Discharge: 2017-08-10 | Disposition: A | Discharge status: Home / Self Care | Payer: Medicare Other | Source: Ambulatory Visit | Attending: Surgery | Admitting: Surgery

## 2017-08-10 ENCOUNTER — Encounter (HOSPITAL_COMMUNITY)
Admission: RE | Disposition: A | Discharge status: Home / Self Care | Payer: Self-pay | Source: Ambulatory Visit | Attending: Surgery

## 2017-08-10 ENCOUNTER — Encounter (HOSPITAL_COMMUNITY): Payer: Self-pay | Admitting: Anesthesiology

## 2017-08-10 ENCOUNTER — Ambulatory Visit (HOSPITAL_COMMUNITY): Payer: Medicare Other

## 2017-08-10 DIAGNOSIS — Z79899 Other long term (current) drug therapy: Secondary | ICD-10-CM | POA: Diagnosis not present

## 2017-08-10 DIAGNOSIS — Z880 Allergy status to penicillin: Secondary | ICD-10-CM | POA: Diagnosis not present

## 2017-08-10 DIAGNOSIS — Z85828 Personal history of other malignant neoplasm of skin: Secondary | ICD-10-CM | POA: Diagnosis not present

## 2017-08-10 DIAGNOSIS — N400 Enlarged prostate without lower urinary tract symptoms: Secondary | ICD-10-CM | POA: Diagnosis not present

## 2017-08-10 DIAGNOSIS — K812 Acute cholecystitis with chronic cholecystitis: Secondary | ICD-10-CM

## 2017-08-10 DIAGNOSIS — D696 Thrombocytopenia, unspecified: Secondary | ICD-10-CM | POA: Diagnosis not present

## 2017-08-10 DIAGNOSIS — K7581 Nonalcoholic steatohepatitis (NASH): Secondary | ICD-10-CM | POA: Insufficient documentation

## 2017-08-10 DIAGNOSIS — K8050 Calculus of bile duct without cholangitis or cholecystitis without obstruction: Secondary | ICD-10-CM

## 2017-08-10 DIAGNOSIS — K8010 Calculus of gallbladder with chronic cholecystitis without obstruction: Secondary | ICD-10-CM | POA: Insufficient documentation

## 2017-08-10 DIAGNOSIS — I10 Essential (primary) hypertension: Secondary | ICD-10-CM | POA: Diagnosis not present

## 2017-08-10 DIAGNOSIS — E785 Hyperlipidemia, unspecified: Secondary | ICD-10-CM | POA: Diagnosis not present

## 2017-08-10 HISTORY — PX: LAPAROSCOPIC CHOLECYSTECTOMY SINGLE SITE WITH INTRAOPERATIVE CHOLANGIOGRAM: SHX6538

## 2017-08-10 SURGERY — LAPAROSCOPIC CHOLECYSTECTOMY SINGLE SITE WITH INTRAOPERATIVE CHOLANGIOGRAM
Anesthesia: General | Site: Abdomen

## 2017-08-10 MED ORDER — IOPAMIDOL (ISOVUE-300) INJECTION 61%
INTRAVENOUS | Status: DC | PRN
  Administered 2017-08-10: 3.5 mL via INTRAVENOUS

## 2017-08-10 MED ORDER — OXYCODONE HCL 5 MG PO TABS: 5.0000 mg | ORAL_TABLET | Freq: Once | ORAL | Status: DC | PRN

## 2017-08-10 MED ORDER — TRAMADOL HCL 50 MG PO TABS: 50.0000 mg | ORAL_TABLET | Freq: Four times a day (QID) | ORAL | 0 refills | Status: DC | PRN

## 2017-08-10 MED ORDER — BUPIVACAINE-EPINEPHRINE 0.25% -1:200000 IJ SOLN
INTRAMUSCULAR | Status: AC
  Filled 2017-08-10: qty 1

## 2017-08-10 MED ORDER — ROCURONIUM BROMIDE 10 MG/ML (PF) SYRINGE
PREFILLED_SYRINGE | INTRAVENOUS | Status: DC | PRN
  Administered 2017-08-10: 5 mg via INTRAVENOUS
  Administered 2017-08-10: 50 mg via INTRAVENOUS

## 2017-08-10 MED ORDER — FENTANYL CITRATE (PF) 100 MCG/2ML IJ SOLN
INTRAMUSCULAR | Status: AC
  Filled 2017-08-10: qty 2

## 2017-08-10 MED ORDER — CHLORHEXIDINE GLUCONATE CLOTH 2 % EX PADS: 6.0000 | MEDICATED_PAD | Freq: Once | CUTANEOUS | Status: DC

## 2017-08-10 MED ORDER — BUPIVACAINE LIPOSOME 1.3 % IJ SUSP
20.0000 mL | Freq: Once | INTRAMUSCULAR | Status: AC
  Administered 2017-08-10: 20 mL
  Filled 2017-08-10: qty 20

## 2017-08-10 MED ORDER — 0.9 % SODIUM CHLORIDE (POUR BTL) OPTIME
TOPICAL | Status: DC | PRN
  Administered 2017-08-10: 1000 mL

## 2017-08-10 MED ORDER — EPHEDRINE 5 MG/ML INJ
INTRAVENOUS | Status: AC
  Filled 2017-08-10: qty 10

## 2017-08-10 MED ORDER — GLYCOPYRROLATE 0.2 MG/ML IV SOSY
PREFILLED_SYRINGE | INTRAVENOUS | Status: DC | PRN
  Administered 2017-08-10: .2 mg via INTRAVENOUS

## 2017-08-10 MED ORDER — HYDROMORPHONE HCL 1 MG/ML IJ SOLN: 0.2500 mg | INTRAMUSCULAR | Status: DC | PRN

## 2017-08-10 MED ORDER — LACTATED RINGERS IV SOLN
INTRAVENOUS | Status: DC
  Administered 2017-08-10: 1000 mL via INTRAVENOUS
  Administered 2017-08-10: 13:00:00 via INTRAVENOUS

## 2017-08-10 MED ORDER — EPHEDRINE SULFATE-NACL 50-0.9 MG/10ML-% IV SOSY
PREFILLED_SYRINGE | INTRAVENOUS | Status: DC | PRN
  Administered 2017-08-10: 5 mg via INTRAVENOUS
  Administered 2017-08-10: 10 mg via INTRAVENOUS
  Administered 2017-08-10: 5 mg via INTRAVENOUS
  Administered 2017-08-10: 10 mg via INTRAVENOUS
  Administered 2017-08-10: 5 mg via INTRAVENOUS

## 2017-08-10 MED ORDER — PROPOFOL 10 MG/ML IV BOLUS
INTRAVENOUS | Status: AC
  Filled 2017-08-10: qty 20

## 2017-08-10 MED ORDER — FENTANYL CITRATE (PF) 100 MCG/2ML IJ SOLN
INTRAMUSCULAR | Status: DC | PRN
  Administered 2017-08-10 (×4): 50 ug via INTRAVENOUS

## 2017-08-10 MED ORDER — ROCURONIUM BROMIDE 50 MG/5ML IV SOSY
PREFILLED_SYRINGE | INTRAVENOUS | Status: AC
  Filled 2017-08-10: qty 5

## 2017-08-10 MED ORDER — BUPIVACAINE-EPINEPHRINE 0.25% -1:200000 IJ SOLN
INTRAMUSCULAR | Status: DC | PRN
  Administered 2017-08-10: 50 mL

## 2017-08-10 MED ORDER — PROPOFOL 10 MG/ML IV BOLUS
INTRAVENOUS | Status: DC | PRN
  Administered 2017-08-10: 170 mg via INTRAVENOUS

## 2017-08-10 MED ORDER — OXYCODONE HCL 5 MG/5ML PO SOLN: 5.0000 mg | Freq: Once | ORAL | Status: DC | PRN

## 2017-08-10 MED ORDER — ACETAMINOPHEN 500 MG PO TABS
1000.0000 mg | ORAL_TABLET | ORAL | Status: AC
  Administered 2017-08-10: 1000 mg via ORAL
  Filled 2017-08-10: qty 2

## 2017-08-10 MED ORDER — ONDANSETRON HCL 4 MG/2ML IJ SOLN
INTRAMUSCULAR | Status: DC | PRN
  Administered 2017-08-10: 4 mg via INTRAVENOUS

## 2017-08-10 MED ORDER — ROCURONIUM BROMIDE 50 MG/5ML IV SOSY
PREFILLED_SYRINGE | INTRAVENOUS | Status: AC
  Filled 2017-08-10: qty 10

## 2017-08-10 MED ORDER — SUGAMMADEX SODIUM 200 MG/2ML IV SOLN
INTRAVENOUS | Status: DC | PRN
  Administered 2017-08-10: 200 mg via INTRAVENOUS

## 2017-08-10 MED ORDER — GLYCOPYRROLATE 0.2 MG/ML IV SOSY
PREFILLED_SYRINGE | INTRAVENOUS | Status: AC
  Filled 2017-08-10: qty 5

## 2017-08-10 MED ORDER — IOPAMIDOL (ISOVUE-300) INJECTION 61%
INTRAVENOUS | Status: AC
  Filled 2017-08-10: qty 50

## 2017-08-10 MED ORDER — ONDANSETRON HCL 4 MG/2ML IJ SOLN
INTRAMUSCULAR | Status: AC
  Filled 2017-08-10: qty 2

## 2017-08-10 MED ORDER — PROMETHAZINE HCL 25 MG/ML IJ SOLN: 6.2500 mg | INTRAMUSCULAR | Status: DC | PRN

## 2017-08-10 MED ORDER — GABAPENTIN 300 MG PO CAPS
300.0000 mg | ORAL_CAPSULE | ORAL | Status: AC
  Administered 2017-08-10: 300 mg via ORAL
  Filled 2017-08-10: qty 1

## 2017-08-10 MED ORDER — LIDOCAINE 2% (20 MG/ML) 5 ML SYRINGE
INTRAMUSCULAR | Status: DC | PRN
  Administered 2017-08-10: 20 mg via INTRAVENOUS
  Administered 2017-08-10: 80 mg via INTRAVENOUS

## 2017-08-10 MED ORDER — SUGAMMADEX SODIUM 200 MG/2ML IV SOLN
INTRAVENOUS | Status: AC
  Filled 2017-08-10: qty 2

## 2017-08-10 MED ORDER — LIDOCAINE 2% (20 MG/ML) 5 ML SYRINGE
INTRAMUSCULAR | Status: AC
  Filled 2017-08-10: qty 5

## 2017-08-10 MED ORDER — RINGERS IRRIGATION IR SOLN
Status: DC | PRN
  Administered 2017-08-10: 3000 mL

## 2017-08-10 SURGICAL SUPPLY — 43 items
APPLIER CLIP 5 13 M/L LIGAMAX5 (MISCELLANEOUS) ×3
APR CLP MED LRG 5 ANG JAW (MISCELLANEOUS) ×1
BAG SPEC RTRVL 10 TROC 200 (ENDOMECHANICALS) ×1
BAG SPEC RTRVL LRG 6X4 10 (ENDOMECHANICALS)
CABLE HIGH FREQUENCY MONO STRZ (ELECTRODE) ×3 IMPLANT
CHLORAPREP W/TINT 26ML (MISCELLANEOUS) ×3 IMPLANT
CLIP APPLIE 5 13 M/L LIGAMAX5 (MISCELLANEOUS) ×1 IMPLANT
COVER MAYO STAND STRL (DRAPES) ×3 IMPLANT
COVER SURGICAL LIGHT HANDLE (MISCELLANEOUS) ×3 IMPLANT
DECANTER SPIKE VIAL GLASS SM (MISCELLANEOUS) ×3 IMPLANT
DRAIN CHANNEL 19F RND (DRAIN) IMPLANT
DRAPE C-ARM 42X120 X-RAY (DRAPES) ×3 IMPLANT
DRAPE WARM FLUID 44X44 (DRAPE) ×3 IMPLANT
DRSG TEGADERM 4X4.75 (GAUZE/BANDAGES/DRESSINGS) ×3 IMPLANT
ELECT REM PT RETURN 15FT ADLT (MISCELLANEOUS) ×3 IMPLANT
ENDOLOOP SUT PDS II  0 18 (SUTURE) ×2
ENDOLOOP SUT PDS II 0 18 (SUTURE) IMPLANT
EVACUATOR SILICONE 100CC (DRAIN) IMPLANT
GAUZE SPONGE 2X2 8PLY STRL LF (GAUZE/BANDAGES/DRESSINGS) ×1 IMPLANT
GLOVE ECLIPSE 8.0 STRL XLNG CF (GLOVE) ×3 IMPLANT
GLOVE INDICATOR 8.0 STRL GRN (GLOVE) ×3 IMPLANT
GOWN STRL REUS W/TWL XL LVL3 (GOWN DISPOSABLE) ×6 IMPLANT
IRRIG SUCT STRYKERFLOW 2 WTIP (MISCELLANEOUS) ×3
IRRIGATION SUCT STRKRFLW 2 WTP (MISCELLANEOUS) ×1 IMPLANT
KIT BASIN OR (CUSTOM PROCEDURE TRAY) ×3 IMPLANT
PAD POSITIONING PINK XL (MISCELLANEOUS) ×3 IMPLANT
POSITIONER SURGICAL ARM (MISCELLANEOUS) IMPLANT
POUCH RETRIEVAL ECOSAC 10 (ENDOMECHANICALS) IMPLANT
POUCH RETRIEVAL ECOSAC 10MM (ENDOMECHANICALS) ×2
POUCH SPECIMEN RETRIEVAL 10MM (ENDOMECHANICALS) IMPLANT
SCISSORS LAP 5X35 DISP (ENDOMECHANICALS) ×3 IMPLANT
SET CHOLANGIOGRAPH MIX (MISCELLANEOUS) ×3 IMPLANT
SHEARS HARMONIC ACE PLUS 36CM (ENDOMECHANICALS) ×3 IMPLANT
SPONGE GAUZE 2X2 STER 10/PKG (GAUZE/BANDAGES/DRESSINGS) ×2
SUT MNCRL AB 4-0 PS2 18 (SUTURE) ×3 IMPLANT
SUT PDS AB 1 CT1 27 (SUTURE) ×6 IMPLANT
SYR 20CC LL (SYRINGE) ×3 IMPLANT
TOWEL OR 17X26 10 PK STRL BLUE (TOWEL DISPOSABLE) ×3 IMPLANT
TOWEL OR NON WOVEN STRL DISP B (DISPOSABLE) ×3 IMPLANT
TRAY LAPAROSCOPIC (CUSTOM PROCEDURE TRAY) ×3 IMPLANT
TROCAR BLADELESS OPT 5 100 (ENDOMECHANICALS) ×3 IMPLANT
TROCAR BLADELESS OPT 5 150 (ENDOMECHANICALS) ×3 IMPLANT
TUBING INSUF HEATED (TUBING) ×3 IMPLANT

## 2017-08-10 NOTE — H&P (Signed)
Peter Chandler  Location: [facility] Patient #: [id number] DOB: 1940/12/24 Married / Language: English / Race: White Male  Patient Care Team: Laurey Morale, MD as PCP - Huston Foley, MD as Consulting Physician (General Surgery) Irine Seal, MD as Attending Physician (Urology) Gatha Mayer, MD as Consulting Physician (Gastroenterology)   History of Present Illness [name] MD; 07/08/2017 3:16 PM) The patient is a 76 year old male who presents for evaluation of gall stones. Note for "Gall stones": The patient sent by his urologist, Dr. Irine Seal, for incidental gallstones and right-sided abdominal pain.  Pleasant active male returns. Comes today with his wife. Has had some intermittent discomfort on his RIGHT posterior flank since 2016. He was sent to me for consultation in January 2017. His pain had been relatively mild. He had some hematuria. Saw urology. CT scan showed large calcified gallstone. Possibility of gallbladder etiology for his pain considered. Surgical consultation requested. I saw him in Jan 2017. He did not have biliary colic. His pain seemed to be on his right posterior flank. I did not feel that he strongly needed cholecystectomy. They agreed and wished to hold off.   He had some hematochezia and some abdominal complaints. Gastrology was consulted. Colonoscopy was negative. Patient notes he's had persistent pain in his right abdomen with intermittent flares. He's had nausea and bloating associated with this. He's not convinced its necessarily related to eating but can happen within 2 hours, especially at night. No regular exercise. He is otherwise rather physically active. He saw his primary care physician. He was placed on proton pump maneuvers. He's not noticed any difference in the past 2 weeks. Occasionally takes some chewable antiacid medications for mild bloating but nothing severe. He has bowel movement every  day. Denies any abdominal surgeries.  No personal nor family history of GI/colon cancer, inflammatory bowel disease, irritable bowel syndrome, allergy such as Celiac Sprue, dietary/dairy problems, colitis, ulcers nor gastritis. No recent sick contacts/gastroenteritis. No travel outside the country. No changes in diet. No dysphagia to solids or liquids. No significant heartburn or reflux. No hematochezia, hematemesis, coffee ground emesis. No evidence of prior gastric/peptic ulceration. No history of hepatitis. No pancreatitis. No pneumonia. No pleuritic chest pain. No hematuria. No kidney stones. No urinary tract infections   Allergies Malachy Moan, RMA; 07/08/2017 2:19 PM) PredniSONE *CORTICOSTEROIDS*  Hives. Penicillin V *PENICILLINS*  Anaphylaxis.  Medication History Malachy Moan, [place]; 07/08/2017 2:18 PM) No Current Medications Medications Reconciled    Review of Systems [name], MD; 07/08/2017 2:39 PM) General Not Present- Appetite Loss, Chills, Fatigue, Fever, Night Sweats, Weight Gain and Weight Loss. Skin Not Present- Change in Wart/Mole, Dryness, Hives, Jaundice, New Lesions, Non-Healing Wounds, Rash and Ulcer. HEENT Present- Visual Disturbances and Wears glasses/contact lenses. Not Present- Earache, Hearing Loss, Hoarseness, Nose Bleed, Oral Ulcers, Ringing in the Ears, Seasonal Allergies, Sinus Pain, Sore Throat and Yellow Eyes. Respiratory Present- Snoring. Not Present- Bloody sputum, Chronic Cough, Difficulty Breathing and Wheezing. Breast Not Present- Breast Mass, Breast Pain, Nipple Discharge and Skin Changes. Cardiovascular Present- Leg Cramps. Not Present- Chest Pain, Difficulty Breathing Lying Down, Palpitations, Rapid Heart Rate, Shortness of Breath and Swelling of Extremities. Male Genitourinary Present- Blood in Urine and Frequency. Not Present- Change in Urinary Stream, Impotence, Nocturia, Painful Urination, Urgency and Urine  Leakage. Musculoskeletal Present- Back Pain and Joint Stiffness. Not Present- Joint Pain, Muscle Pain, Muscle Weakness and Swelling of Extremities. Hematology Present- Easy Bruising. Not Present- Excessive bleeding,  Gland problems, HIV and Persistent Infections.  Vitals Malachy Moan RMA; 07/08/2017 2:19 PM) 07/08/2017 2:19 PM Weight: 175 lb Height: 69in Body Surface Area: 1.95 m Body Mass Index: 25.84 kg/m  Temp.: 40F  Pulse: 79 (Regular)  BP: 130/82 (Sitting, Left Arm, Standard)    BP (!) 141/99   Pulse (!) 58   Temp 98.9 F (37.2 C) (Oral)   Resp 16   Ht 5\' 8"  (1.727 m)   Wt 79.4 kg (175 lb)   SpO2 100%   BMI 26.61 kg/m     Physical Exam [name] MD; 07/08/2017 3:11 PM) General Mental Status-Alert. General Appearance-Not in acute distress, Not Sickly. Orientation-Oriented X3. Hydration-Well hydrated. Voice-Normal.  Integumentary Global Assessment Normal Exam - Axillae: non-tender, no inflammation or ulceration, no drainage. and Distribution of scalp and body hair is normal. General Characteristics Temperature - normal warmth is noted.  Head and Neck Head-normocephalic, atraumatic with no lesions or palpable masses. Face Global Assessment - atraumatic, no absence of expression. Neck Global Assessment - no abnormal movements, no bruit auscultated on the right, no bruit auscultated on the left, no decreased range of motion, non-tender. Trachea-midline. Thyroid Gland Characteristics - non-tender.  Eye Eyeball - Left-Extraocular movements intact, No Nystagmus. Eyeball - Right-Extraocular movements intact, No Nystagmus. Cornea - Left-No Hazy. Cornea - Right-No Hazy. Sclera/Conjunctiva - Left-No scleral icterus, No Discharge. Sclera/Conjunctiva - Right-No scleral icterus, No Discharge. Pupil - Left-Direct reaction to light normal. Pupil - Right-Direct reaction to light normal.  ENMT Ears Pinna - Left - no  drainage observed, no generalized tenderness observed. Right - no drainage observed, no generalized tenderness observed. Nose and Sinuses Nose - no destructive lesion observed. Nares - Left - quiet respiration. Right - quiet respiration. Mouth and Throat Lips - Upper Lip - no fissures observed, no pallor noted. Lower Lip - no fissures observed, no pallor noted. Nasopharynx - no discharge present. Oral Cavity/Oropharynx - Tongue - no dryness observed. Oral Mucosa - no cyanosis observed. Hypopharynx - no evidence of airway distress observed.  Chest and Lung Exam Inspection Movements - Normal and Symmetrical. Accessory muscles - No use of accessory muscles in breathing. Palpation Normal exam - Non-tender. Auscultation Breath sounds - Normal and Clear. Note: No pain on sternal / chest wall compression   Cardiovascular Auscultation Rhythm - Regular. Murmurs & Other Heart Sounds - Normal exam - No Murmurs and No Systolic Clicks.  Abdomen Inspection Normal Exam - No Visible peristalsis and No Abnormal pulsations. Umbilicus - No Bleeding, No Urine drainage. Palpation/Percussion Normal exam - Soft, Non Tender, No Rebound tenderness, No Rigidity (guarding) and No Cutaneous hyperesthesia. Note: Mild R sided abdominal pain, slightly lower than the typical subcostal location. No Murphy sign. Mild diastases recti. No umbilical hernia.   Male Genitourinary Sexual Maturity Tanner 5 - Adult hair pattern and Adult penile size and shape. Note: Normal external genitalia. Epididymi, testes, and spermatic cords normal without any masses. No inguinal hernias.   Peripheral Vascular Upper Extremity Inspection - Left - No Cyanotic nailbeds, Not Ischemic. Right - No Cyanotic nailbeds, Not Ischemic.  Neurologic Neurologic evaluation reveals -normal attention span and ability to concentrate, able to name objects and repeat phrases. Appropriate fund of knowledge , normal sensation and normal  coordination. Mental Status Affect - not angry, not paranoid. Cranial Nerves-Normal Bilaterally. Gait-Normal.  Neuropsychiatric Mental status exam performed with findings of-able to articulate well with normal speech/language, rate, volume and coherence, thought content normal with ability to perform basic computations and apply  abstract reasoning and no evidence of hallucinations, delusions, obsessions or homicidal/suicidal ideation.  Musculoskeletal Global Assessment Spine, Ribs and Pelvis - no instability, subluxation or laxity. Right Upper Extremity - no instability, subluxation or laxity. Note: No posterior flank pain.   Lymphatic Head & Neck General Head & Neck Lymphatics: Bilateral - Description - No Localized lymphadenopathy. Axillary General Axillary Region: Bilateral - Description - No Localized lymphadenopathy. Femoral & Inguinal Generalized Femoral & Inguinal Lymphatics: Left: Right - Description - No Localized lymphadenopathy. Description - No Localized lymphadenopathy.    Assessment & Plan [name] MD; 07/08/2017 3:13 PM) CHRONIC CHOLECYSTITIS WITH CALCULUS (K80.10) Impression: Moderate-sized calcified gallstones with persistent right-sided abdominal pain. Now chronic with intermittent flares. Also intermittent bloating and nausea. Usually the most intense at night. Getting worse. Happening more often. Getting more intense. I cannot tease out a definite biliary colic but very suspicious.  No improvement with proton pump inhibitors. The rest of the differential diagnosis is underwhelming. Normal colonoscopy last year and underwhelming gastroenterology consultation. I offered cholecystectomy. They're interested in proceeding.   Current Plans You are being scheduled for surgery- Our schedulers will call you.  You should hear from our office's scheduling department within 5 working days about the location, date, and time of surgery. We try to make  accommodations for patient's preferences in scheduling surgery, but sometimes the OR schedule or the surgeon's schedule prevents [place] from making those accommodations.  If you have not heard from our office [phone]) in 5 working days, call the office and ask for your surgeon's nurse.  If you have other questions about your diagnosis, plan, or surgery, call the office and ask for your surgeon's nurse.  Written instructions provided Pt Education - Pamphlet Given - Laparoscopic Gallbladder Surgery: discussed with patient and provided information. The anatomy & physiology of hepatobiliary & pancreatic function was discussed. The pathophysiology of gallbladder dysfunction was discussed. Natural history risks without surgery was discussed. I feel the risks of no intervention will lead to serious problems that outweigh the operative risks; therefore, I recommended cholecystectomy to remove the pathology. I explained laparoscopic techniques with possible need for an open approach. Probable cholangiogram to evaluate the bilary tract was explained as well.  Risks such as bleeding, infection, abscess, leak, injury to other organs, need for further treatment, heart attack, death, and other risks were discussed. I noted a good likelihood this will help address the problem. Possibility that this will not correct all abdominal symptoms was explained. Goals of post-operative recovery were discussed as well. We will work to minimize complications. An educational handout further explaining the pathology and treatment options was given as well. Questions were answered. The patient expresses understanding & wishes to proceed with surgery.  Pt Education - [facility] Laparosopic Post Op HCI (Denice Cardon) Pt Education - [facility] Good Bowel Health (Kamyia Thomason) Pt Education - Laparoscopic Cholecystectomy: gallbladder   CHRONIC RIGHT-SIDED LOW BACK PAIN WITHOUT SCIATICA (M54.5) Impression: Mild RIGHT lumbar back discomfort.  Suspect muscular skeletal strain. Question of radicular radiation to the RIGHT side without any evidence of hernia or other abnormality.  Consider trial of anti-inflammatories and heat for the next 3-6 weeks. See if that'll calm down on its own. Recent CT scanning argues against any major anatomical abnormalities.  Symptoms are so mild he doesn't do anything about it. They feel reassured. If things markedly worsen, consider evaluation with spine surgeon. Current Plans Pt Education - [facility] Pain Control (Junious Ragone)  [name], M.D., F.A.C.S. Gastrointestinal and Minimally Invasive Surgery  [facility] [phone] [address], [place], [place] [address] [phone] Main / Paging

## 2017-08-10 NOTE — Progress Notes (Addendum)
Patient able to stand, ambulate to and from bathroom with one assist.  Unable to urinate.

## 2017-08-10 NOTE — Progress Notes (Signed)
Patient able to stand with one assist.  Unable to safely ambulate to bathroom.  Return to recliner safely.

## 2017-08-10 NOTE — Op Note (Signed)
08/10/2017  PATIENT:  Peter Chandler  76 y.o. male  Patient Care Team: Laurey Morale, MD as PCP - Huston Foley, MD as Consulting Physician (General Surgery) Irine Seal, MD as Attending Physician (Urology) Gatha Mayer, MD as Consulting Physician (Gastroenterology)  PRE-OPERATIVE DIAGNOSIS:    Acute on Chronic Calculus Cholecystitis  POST-OPERATIVE DIAGNOSIS:   Acute on Chronic Calculus Cholecystitis  Liver: Fatty steatohepatitis  PROCEDURE:  SINGLE SITE Laparoscopic cholecystectomy with intraoperative cholangiogram  SURGEON:  [name], MD, FACS.  ASSISTANT: OR Staff   ANESTHESIA:    General with endotracheal intubation Local anesthetic as a field block  EBL:  (See Anesthesia Intraoperative Record) Total I/O In: 1100 [I.V.:1100] Out: 25 [Blood:25]  Delay start of Pharmacological VTE agent (>24hrs) due to surgical blood loss or risk of bleeding:  no  DRAINS: None   SPECIMEN: Gallbladder    DISPOSITION OF SPECIMEN:  PATHOLOGY  COUNTS:  YES  PLAN OF CARE: Discharge to home after PACU  PATIENT DISPOSITION:  PACU - hemodynamically stable.  INDICATION: 76 year old male with postprandial symptoms and pain suspicious for gallbladder etiology.  The rest of the differential diagnosis seems less likely.  He wished to be aggressive and proceed with surgery in the hopes of correcting his symptoms.  The anatomy & physiology of hepatobiliary & pancreatic function was discussed.  The pathophysiology of gallbladder dysfunction was discussed.  Natural history risks without surgery was discussed.   I feel the risks of no intervention will lead to serious problems that outweigh the operative risks; therefore, I recommended cholecystectomy to remove the pathology.  I explained laparoscopic techniques with possible need for an open approach.  Probable cholangiogram to evaluate the bilary tract was explained as well.    Risks such as bleeding, infection, abscess,  leak, injury to other organs, need for further treatment, heart attack, death, and other risks were discussed.  I noted a good likelihood this will help address the problem.  Possibility that this will not correct all abdominal symptoms was explained.  Goals of post-operative recovery were discussed as well.  We will work to minimize complications.  An educational handout further explaining the pathology and treatment options was given as well.  Questions were answered.  The patient expresses understanding & wishes to proceed with surgery.  OR FINDINGS: Very thickened gallbladder with clear colorless "white" bile.  Consistent with chronic cystic duct obstruction and chronic acute on chronic cholecystitis.  Giant stone and infundibulum.  Significant inflammation at infundibulum.  Fortunately, cystic duct and common bile duct not inflamed.  Duodenal sweep nearby but not involved.  No evidence of any erosion or fistula.  Liver: Friable suspicious for some fatty change.  No cirrhosis.  DESCRIPTION:   The patient was identified & brought in the operating room. The patient was positioned supine with arms tucked. SCDs were active during the entire case. The patient underwent general anesthesia without any difficulty.  The abdomen was prepped and draped in a sterile fashion. A Surgical Timeout confirmed our plan.  I made a transverse curvilinear incision through the superior umbilical fold.  I placed a 24mm long port through the supraumbilical fascia using a modified Hassan cutdown technique with umbilical stalk fascial countertraction. I began carbon dioxide insufflation.  No change in end tidal CO2 measurement.   Camera inspection revealed no injury. There were no adhesions to the anterior abdominal wall supraumbilically.  I proceeded to continue with single site technique. I placed a #5 port in left upper  aspect of the wound. I placed a 5 mm atraumatic grasper in the right inferior aspect of the wound.  I  turned attention to the right upper quadrant.  Gallbladder was very thickened with adhesions.  I was not able to grasp it.  I therefore aspirated clear colorless bile consistent with chronic cystic duct obstruction.  That better decompressed the gallbladder.  He had an obvious large jaw breaker sized stone in his infundibulum.  The gallbladder fundus was elevated cephalad. I freed adhesions to the ventral surface of the gallbladder off carefully.  I freed the peritoneal coverings between the gallbladder and the liver on the posteriolateral and anteriomedial walls. I alternated between Harmonic & blunt Maryland dissection to help get a good critical view of the cystic artery and cystic duct. I did further dissection to free the inferior two thirds of the gallbladder off the liver bed to get a good critical view of the infundibulum and cystic duct.  He was quite inflamed on the infundibulum.   I ended up having to evacuate the very large gallstone to help decompress the gallbladder and more safely dissect.  I carefully skeletonized with sharp dissection and focused harmonic dissection, staying away from the duodenal bulb which was not inflamed.  I was far away from the transverse colon.  Seem consistent with some epiploic appendages and greater omentum.  I dissected out the cystic artery; and, after getting a good 360 view, ligated the anterior & posterior branches of the cystic artery close on the infundibulum using the Harmonic ultrasonic dissection.  He had an inflamed lymph node of Calot that I carefully skeletonized off and freed off.  I skeletonized the cystic duct.  Foreshorten 2 cm.  I placed a clip on the infundibulum. I did a partial cystic duct-otomy and ensured patency. I placed a 5 [place] cholangiocatheter through a puncture site at the right subcostal ridge of the abdominal wall and directed it into the cystic duct.  We ran a cholangiogram with dilute radio-opaque contrast and continuous fluoroscopy.  Contrast flowed from a side branch consistent with cystic duct cannulization. Contrast flowed up the common hepatic duct into the right and left intrahepatic chains out to secondary radicals.  Took some pressure to get to the left intrahepatic chain, but contrast flowed down the common bile duct easily across the normal ampulla into the duodenum.  This was consistent with a normal cholangiogram.  I freed the rest of the gallbladder off the liver bed for a dome down presentation.  Hepatic fossa was quite friable and oozy.  However eventually got good control with hemostasis.  The cystic duct was too inflamed to hold clips, so I used a 0 PDS Endoloop to ligate the cystic duct and its base from the takeoff of the common bile duct.  Then I was able to pace a few clips more distally towards the infundibulum.  I completed cystic duct transection. I ensured hemostasis on the gallbladder fossa of the liver and elsewhere.  I placed the gallbladder, large gallstone, and inflamed lymph node all in an eco-sac bag.  I inspected the rest of the abdomen & detected no injury nor bleeding elsewhere.  Did copious serial irrigations over 3 L.  Things cleared up well.  Hemostasis good.  Ligation on the cystic duct intact.  No bile or blood.  I removed the gallbladder out the supraumbilical fascia inside the bag.  I had opened up the fascia 2.5 cm to get the gallbladder and the giant stone  out.  I closed the fascia transversely using #1 PDS interrupted stitches. I closed the skin using 4-0 monocryl stitch.  Sterile dressing was applied. The patient was extubated & arrived in the PACU in stable condition..  I had discussed postoperative care with the patient in the holding area. I discussed operative findings, updated the patient's status, discussed probable steps to recovery, and gave postoperative recommendations to the patient's spouse.  Recommendations were made.  Questions were answered.  She expressed understanding &  appreciation.  [name], M.D., F.A.C.S. Gastrointestinal and Minimally Invasive Surgery [facility] [address] [place], [place] [address] [phone] Main / Paging  08/10/2017 1:44 PM

## 2017-08-10 NOTE — Transfer of Care (Signed)
Immediate Anesthesia Transfer of Care Note  Patient: Peter Chandler  Procedure(s) Performed: LAPAROSCOPIC CHOLECYSTECTOMY SINGLE SITE WITH INTRAOPERATIVE CHOLANGIOGRAM (N/A Abdomen)  Patient Location: PACU  Anesthesia Type:General  Level of Consciousness: awake and oriented  Airway & Oxygen Therapy: Patient Spontanous Breathing and Patient connected to face mask oxygen  Post-op Assessment: Report given to RN  Post vital signs: Reviewed and stable  Last Vitals:  Vitals:   08/10/17 0932  BP: (!) 141/99  Pulse: (!) 58  Resp: 16  Temp: 37.2 C  SpO2: 100%    Last Pain:  Vitals:   08/10/17 0932  TempSrc: Oral      Patients Stated Pain Goal: 4 (47/39/58 [id number])  Complications: No apparent anesthesia complications

## 2017-08-10 NOTE — Progress Notes (Signed)
Attempted to ambulate patient on side of bed. Peter Chandler keeps stating, "I'm so sleepy." He sat on side of bed, unable to stand without closing eyes. Patient moved from stretcher to recliner. Denies pain. Vitals stable. Will continue to monitor.

## 2017-08-10 NOTE — Anesthesia Postprocedure Evaluation (Signed)
Anesthesia Post Note  Patient: Peter Chandler  Procedure(s) Performed: LAPAROSCOPIC CHOLECYSTECTOMY SINGLE SITE WITH INTRAOPERATIVE CHOLANGIOGRAM (N/A Abdomen)     Patient location during evaluation: PACU Anesthesia Type: General Level of consciousness: awake and alert Pain management: pain level controlled Vital Signs Assessment: post-procedure vital signs reviewed and stable Respiratory status: spontaneous breathing, nonlabored ventilation, respiratory function stable and patient connected to nasal cannula oxygen Cardiovascular status: blood pressure returned to baseline and stable Postop Assessment: no apparent nausea or vomiting Anesthetic complications: no    Last Vitals:  Vitals:   08/10/17 1415 08/10/17 1433  BP: (!) 143/73 128/71  Pulse: 71 73  Resp: 13 14  Temp: (!) 36.3 C (!) 36.4 C  SpO2: 96% 99%    Last Pain:  Vitals:   08/10/17 1415  TempSrc:   PainSc: 0-No pain                 Jancy Sprankle S

## 2017-08-10 NOTE — Anesthesia Preprocedure Evaluation (Signed)
Anesthesia Evaluation  Patient identified by MRN, date of birth, ID band Patient awake    Reviewed: Allergy & Precautions, NPO status , Patient's Chart, lab work & pertinent test results  Airway Mallampati: II  TM Distance: >3 FB Neck ROM: Full    Dental no notable dental hx.    Pulmonary neg pulmonary ROS,    Pulmonary exam normal breath sounds clear to auscultation       Cardiovascular negative cardio ROS Normal cardiovascular exam Rhythm:Regular Rate:Normal     Neuro/Psych negative neurological ROS  negative psych ROS   GI/Hepatic negative GI ROS, Neg liver ROS,   Endo/Other  negative endocrine ROS  Renal/GU negative Renal ROS  negative genitourinary   Musculoskeletal negative musculoskeletal ROS (+)   Abdominal   Peds negative pediatric ROS (+)  Hematology negative hematology ROS (+)   Anesthesia Other Findings   Reproductive/Obstetrics negative OB ROS                             Anesthesia Physical Anesthesia Plan  ASA: II  Anesthesia Plan: General   Post-op Pain Management:    Induction: Intravenous  PONV Risk Score and Plan: 2 and Ondansetron and Dexamethasone  Airway Management Planned: Oral ETT  Additional Equipment:   Intra-op Plan:   Post-operative Plan: Extubation in OR  Informed Consent: I have reviewed the patients History and Physical, chart, labs and discussed the procedure including the risks, benefits and alternatives for the proposed anesthesia with the patient or authorized representative who has indicated his/her understanding and acceptance.   Dental advisory given  Plan Discussed with: CRNA and Surgeon  Anesthesia Plan Comments:         Anesthesia Quick Evaluation  

## 2017-08-10 NOTE — Discharge Instructions (Signed)
LAPAROSCOPIC SURGERY: POST OP INSTRUCTIONS  ######################################################################  EAT Gradually transition to a high fiber diet with a fiber supplement over the next few weeks after discharge.  Start with a pureed / full liquid diet (see below)  WALK Walk an hour a day.  Control your pain to do that.    CONTROL PAIN Control pain so that you can walk, sleep, tolerate sneezing/coughing, go up/down stairs.  HAVE A BOWEL MOVEMENT DAILY Keep your bowels regular to avoid problems.  OK to try a laxative to override constipation.  OK to use an antidairrheal to slow down diarrhea.  Call if not better after 2 tries  CALL IF YOU HAVE PROBLEMS/CONCERNS Call if you are still struggling despite following these instructions. Call if you have concerns not answered by these instructions  ######################################################################    1. DIET: Follow a light bland diet the first 24 hours after arrival home, such as soup, liquids, crackers, etc.  Be sure to include lots of fluids daily.  Avoid fast food or heavy meals as your are more likely to get nauseated.  Eat a low fat the next few days after surgery.   2. Take your usually prescribed home medications unless otherwise directed. 3. PAIN CONTROL: a. Pain is best controlled by a usual combination of three different methods TOGETHER: i. Ice/Heat ii. Over the counter pain medication iii. Prescription pain medication b. Most patients will experience some swelling and bruising around the incisions.  Ice packs or heating pads (30-60 minutes up to 6 times a day) will help. Use ice for the first few days to help decrease swelling and bruising, then switch to heat to help relax tight/sore spots and speed recovery.  Some people prefer to use ice alone, heat alone, alternating between ice & heat.  Experiment to what works for you.  Swelling and bruising can take several weeks to resolve.   c. It is  helpful to take an over-the-counter pain medication regularly for the first few weeks.  Choose one of the following that works best for you: i. Naproxen (Aleve, etc)  Two 251m tabs twice a day ii. Ibuprofen (Advil, etc) Three 2053mtabs four times a day (every meal & bedtime) iii. Acetaminophen (Tylenol, etc) 500-65044mour times a day (every meal & bedtime) d. A  prescription for pain medication (such as oxycodone, hydrocodone, etc) should be given to you upon discharge.  Take your pain medication as prescribed.  i. If you are having problems/concerns with the prescription medicine (does not control pain, nausea, vomiting, rash, itching, etc), please call us [phone] see if we need to switch you to a different pain medicine that will work better for you and/or control your side effect better. ii. If you need a refill on your pain medication, please contact your pharmacy.  They will contact our office to request authorization. Prescriptions will not be filled after 5 pm or on week-ends. 4. Avoid getting constipated.  Between the surgery and the pain medications, it is common to experience some constipation.  Increasing fluid intake and taking a fiber supplement (such as Metamucil, Citrucel, FiberCon, MiraLax, etc) 1-2 times a day regularly will usually help prevent this problem from occurring.  A mild laxative (prune juice, Milk of Magnesia, MiraLax, etc) should be taken according to package directions if there are no bowel movements after 48 hours.   5. Watch out for diarrhea.  If you have many loose bowel movements, simplify your diet to bland foods & liquids for  a few days.  Stop any stool softeners and decrease your fiber supplement.  Switching to mild anti-diarrheal medications (Kayopectate, Pepto Bismol) can help.  If this worsens or does not improve, please call us. °6. Wash / shower every day.  You may shower over the dressings as they are waterproof.  Continue to shower over incision(s)  after the dressing is off. °7. Remove your waterproof bandages 5 days after surgery.  You may leave the incision open to air.  You may replace a dressing/Band-Aid to cover the incision for comfort if you wish.  °8. ACTIVITIES as tolerated:   °a. You may resume regular (light) daily activities beginning the next day--such as daily self-care, walking, climbing stairs--gradually increasing activities as tolerated.  If you can walk 30 minutes without difficulty, it is safe to try more intense activity such as jogging, treadmill, bicycling, low-impact aerobics, swimming, etc. °b. Save the most intensive and strenuous activity for last such as sit-ups, heavy lifting, contact sports, etc  Refrain from any heavy lifting or straining until you are off narcotics for pain control.   °c. DO NOT PUSH THROUGH PAIN.  Let pain be your guide: If it hurts to do something, don't do it.  Pain is your body warning you to avoid that activity for another week until the pain goes down. °d. You may drive when you are no longer taking prescription pain medication, you can comfortably wear a seatbelt, and you can safely maneuver your car and apply brakes. °e. You may have sexual intercourse when it is comfortable.  °9. FOLLOW UP in our office °a. Please call [facility] at [phone] to set up an appointment to see your surgeon in the office for a follow-up appointment approximately 2-3 weeks after your surgery. °b. Make sure that you call for this appointment the day you arrive home to insure a convenient appointment time. °10. IF YOU HAVE DISABILITY OR FAMILY LEAVE FORMS, BRING THEM TO THE OFFICE FOR PROCESSING.  DO NOT GIVE THEM TO YOUR DOCTOR. ° ° °WHEN TO CALL US [phone]: °1. Poor pain control °2. Reactions / problems with new medications (rash/itching, nausea, etc)  °3. Fever over 101.5 F (38.5 C) °4. Inability to urinate °5. Nausea and/or vomiting °6. Worsening swelling or bruising °7. Continued bleeding from incision. °8. Increased  pain, redness, or drainage from the incision ° ° The clinic staff is available to answer your questions during regular business hours (8:30am-5pm).  Please don’t hesitate to call and ask to speak to one of our nurses for clinical concerns.  ° If you have a medical emergency, go to the nearest emergency room or call 911. ° A surgeon from [facility] is always on call at the hospitals ° ° °[facility] °[address], [address], [place], [place]  [address] ? °MAIN: [phone] ? TOLL FREE: [phone] ?  °FAX [phone] °[email or web address] ° ° ° °Cholecystitis °Cholecystitis is inflammation of the gallbladder. It is often called a gallbladder attack. The gallbladder is a pear-shaped organ that lies beneath the liver on the right side of the body. The gallbladder stores bile, which is a fluid that helps the body to digest fats. If bile builds up in your gallbladder, your gallbladder becomes inflamed. This condition may occur suddenly (be acute). Repeat episodes of acute cholecystitis or prolonged episodes may lead to a long-term (chronic) condition. Cholecystitis is serious and it requires treatment. °What are the causes? °The most common cause of this   condition is gallstones. Gallstones can block the tube (duct) that carries bile out of your gallbladder. This causes bile to build up. Other causes of this condition include:  Damage to the gallbladder due to a decrease in blood flow.  Infections in the bile ducts.  Scars or kinks in the bile ducts.  Tumors in the liver, pancreas, or gallbladder.  What increases the risk? This condition is more likely to develop in:  People who have sickle cell disease.  People who take birth control pills or use estrogen.  People who have alcoholic liver disease.  People who have liver cirrhosis.  People who have their nutrition delivered through a vein (parenteral nutrition).  People who do not eat or drink  (do fasting) for a long period of time.  People who are obese.  People who have rapid weight loss.  People who are pregnant.  People who have increased triglyceride levels.  People who have pancreatitis.  What are the signs or symptoms? Symptoms of this condition include:  Abdominal pain, especially in the upper right area of the abdomen.  Abdominal tenderness or bloating.  Nausea.  Vomiting.  Fever.  Chills.  Yellowing of the skin and the whites of the eyes (jaundice).  How is this diagnosed? This condition is diagnosed with a medical history and physical exam. You may also have other tests, including:  Imaging tests, such as: ? An ultrasound of the gallbladder. ? A CT scan of the abdomen. ? A gallbladder nuclear scan (HIDA scan). This scan allows your health care provider to see the bile moving from your liver to your gallbladder and to your small intestine. ? MRI.  Blood tests, such as: ? A complete blood count, because the white blood cell count may be higher than normal. ? Liver function tests, because some levels may be higher than normal with certain types of gallstones.  How is this treated? Treatment may include:  Fasting for a certain amount of time.  IV fluids.  Medicine to treat pain or vomiting.  Antibiotic medicine.  Surgery to remove your gallbladder (cholecystectomy). This may happen immediately or at a later time.  Follow these instructions at home: Home care will depend on your treatment. In general:  Take over-the-counter and prescription medicines only as told by your health care provider.  If you were prescribed an antibiotic medicine, take it as told by your health care provider. Do not stop taking the antibiotic even if you start to feel better.  Follow instructions from your health care provider about what to eat or drink. When you are allowed to eat, avoid eating or drinking anything that triggers your symptoms.  Keep all  follow-up visits as told by your health care provider. This is important.  Contact a health care provider if:  Your pain is not controlled with medicine.  You have a fever. Get help right away if:  Your pain moves to another part of your abdomen or to your back.  You continue to have symptoms or you develop new symptoms even with treatment. This information is not intended to replace advice given to you by your health care provider. Make sure you discuss any questions you have with your health care provider. Document Released: 09/20/2005 Document Revised: 01/29/2016 Document Reviewed: 01/01/2015 Elsevier Interactive Patient Education  2017 [place] Anesthesia, Adult, Care After These instructions provide you with information about caring for yourself after your procedure. Your health care provider may also give you more  specific instructions. Your treatment has been planned according to current medical practices, but problems sometimes occur. Call your health care provider if you have any problems or questions after your procedure. What can I expect after the procedure? After the procedure, it is common to have:  Vomiting.  A sore throat.  Mental slowness.  It is common to feel:  Nauseous.  Cold or shivery.  Sleepy.  Tired.  Sore or achy, even in parts of your body where you did not have surgery.  Follow these instructions at home: For at least 24 hours after the procedure:  Do not: ? Participate in activities where you could fall or become injured. ? Drive. ? Use heavy machinery. ? Drink alcohol. ? Take sleeping pills or medicines that cause drowsiness. ? Make important decisions or sign legal documents. ? Take care of children on your own.  Rest. Eating and drinking  If you vomit, drink water, juice, or soup when you can drink without vomiting.  Drink enough fluid to keep your urine clear or pale yellow.  Make sure you have little or no  nausea before eating solid foods.  Follow the diet recommended by your health care provider. General instructions  Have a responsible adult stay with you until you are awake and alert.  Return to your normal activities as told by your health care provider. Ask your health care provider what activities are safe for you.  Take over-the-counter and prescription medicines only as told by your health care provider.  If you smoke, do not smoke without supervision.  Keep all follow-up visits as told by your health care provider. This is important. Contact a health care provider if:  You continue to have nausea or vomiting at home, and medicines are not helpful.  You cannot drink fluids or start eating again.  You cannot urinate after 8-12 hours.  You develop a skin rash.  You have fever.  You have increasing redness at the site of your procedure. Get help right away if:  You have difficulty breathing.  You have chest pain.  You have unexpected bleeding.  You feel that you are having a life-threatening or urgent problem. This information is not intended to replace advice given to you by your health care provider. Make sure you discuss any questions you have with your health care provider. Document Released: 12/27/2000 Document Revised: 02/23/2016 Document Reviewed: 09/04/2015 Elsevier Interactive Patient Education  [employer].

## 2017-08-10 NOTE — Anesthesia Procedure Notes (Signed)
Procedure Name: Intubation Date/Time: 08/10/2017 11:59 AM Performed by: Lavina Hamman, CRNA Pre-anesthesia Checklist: Patient identified, Emergency Drugs available, Suction available, Patient being monitored and Timeout performed Patient Re-evaluated:Patient Re-evaluated prior to induction Oxygen Delivery Method: Circle system utilized Preoxygenation: Pre-oxygenation with 100% oxygen Induction Type: IV induction Ventilation: Mask ventilation without difficulty Laryngoscope Size: Mac and 4 Grade View: Grade II Tube type: Oral Tube size: 7.5 mm Number of attempts: 1 Airway Equipment and Method: Stylet Placement Confirmation: ETT inserted through vocal cords under direct vision,  positive ETCO2,  CO2 detector and breath sounds checked- equal and bilateral Secured at: 22 cm Tube secured with: Tape Dental Injury: Teeth and Oropharynx as per pre-operative assessment

## 2017-08-10 NOTE — Interval H&P Note (Signed)
History and Physical Interval Note:  08/10/2017 11:02 AM  Peter Chandler  has presented today for surgery, with the diagnosis of SYMPTOMATIC BILIARY COLIC, PROBABLE CHRONIC CHOLECYSTITIS  The various methods of treatment have been discussed with the patient and family. After consideration of risks, benefits and other options for treatment, the patient has consented to  Procedure(s): [place] CHOLANGIOGRAM (N/A) as a surgical intervention .  The patient's history has been reviewed, patient examined, no change in status, stable for surgery.  I have reviewed the patient's chart and labs.  Questions were answered to the patient's satisfaction.    I have re-reviewed the the patient's records, history, medications, and allergies.  I have re-examined the patient.  I again discussed intraoperative plans and goals of post-operative recovery.  The patient agrees to proceed.  Peter Chandler  1941/03/24 [id number]  Patient Care Team: Laurey Morale, MD as PCP - Huston Foley, MD as Consulting Physician (General Surgery) Irine Seal, MD as Attending Physician (Urology) Gatha Mayer, MD as Consulting Physician (Gastroenterology)  Patient Active Problem List   Diagnosis Date Noted  . Murmur 07/25/2017  . Preop cardiovascular exam 07/25/2017  . Rectal bleeding   . Hemorrhoids, internal, with bleeding   . Hematochezia 05/13/2016  . BPH (benign prostatic hyperplasia) 05/13/2016  . Thrombocytopenia ([place]) 05/13/2016  . Elevated blood pressure reading without diagnosis of hypertension 05/13/2016  . Shingles 09/14/2012  . HLD (hyperlipidemia) 07/25/2007  . SYMPTOM, JOINT NEC, HAND 07/25/2007  . TB SKIN TEST, POSITIVE 07/25/2007  . SYNCOPE, HX OF 07/25/2007    Past Medical History:  Diagnosis Date  . Arthritis    "hands" (05/13/2016)  . Basal cell carcinoma    "face, back"  . Bleeding per rectum 05/13/2016  . BPH (benign prostatic  hyperplasia) 09/2015  . Cholelithiasis 09/2015   noted on CT done by urology for his Peter Chandler hematuria w/u--pt was referred to gen surg by Dr. Ralene Muskrat office  . Chronic thrombocytopenic purpura ([place])    Peter Chandler 05/13/2016  . Diplopia 2017   left eye.  glasses lens fitted with a prism  . [place] hematuria 09/2015   Painless.  cystoscopy ~ 09/2015, Dr Jeffie Pollock.  Conclusion (per pt) is source of blood is the prostate.    . Hyperlipidemia     Past Surgical History:  Procedure Laterality Date  . COLONOSCOPY  11/05/2002   Internal hemorrhoids, hyperplastic sigmoid polyp, ascending erythema, descending venous lake, hypertrophied papilla. Dr. Aurelio Jew, [place]  . CYSTOSCOPY  ~ 09/2015   to eval Peter Chandler hematuria.  Dr Jeffie Pollock at [facility] urology.     Social History   Socioeconomic History  . Marital status: Married    Spouse name: Not on file  . Number of children: Not on file  . Years of education: Not on file  . Highest education level: Not on file  Social Needs  . Financial resource strain: Not on file  . Food insecurity - worry: Not on file  . Food insecurity - inability: Not on file  . Transportation needs - medical: Not on file  . Transportation needs - non-medical: Not on file  Occupational History  . Not on file  Tobacco Use  . Smoking status: Never Smoker  . Smokeless tobacco: Never Used  Substance and Sexual Activity  . Alcohol use: No    Alcohol/week: 0.0 oz  . Drug use: No  . Sexual activity: Yes  Other Topics Concern  . 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      Family History  Problem Relation Age of Onset  . Ovarian cancer Unknown   . Heart disease Unknown   . Heart disease Father 97       Died age 66    Medications Prior to Admission  Medication Sig Dispense Refill Last Dose  . ibuprofen (ADVIL,MOTRIN) 200 MG tablet Take 200 mg by mouth every 6 (six) hours as needed for headache or moderate pain.   Past Month at Unknown time     Current Facility-Administered Medications  Medication Dose Route Frequency Provider Last Rate Last Dose  . bupivacaine liposome (EXPAREL) 1.3 % injection 266 mg  20 mL Infiltration Once Peter Boston, MD      . Chlorhexidine Gluconate Cloth 2 % PADS 6 each  6 each Topical Once Peter Boston, MD       And  . Chlorhexidine Gluconate Cloth 2 % PADS 6 each  6 each Topical Once Peter Boston, MD      . lactated ringers infusion   Intravenous Continuous Peter Pili, MD 100 mL/hr at 08/10/17 1036 1,000 mL at 08/10/17 1036     Allergies  Allergen Reactions  . Penicillins Other (See Comments)    REACTION: anaphylaxis Has patient had a PCN reaction causing immediate rash, facial/tongue/throat swelling, SOB or lightheadedness with hypotension: YES Has patient had a PCN reaction causing severe rash involving mucus membranes or skin necrosis: NO Has patient had a PCN reaction that required hospitalization NO Has patient had a PCN reaction occurring within the last 10 years: NO If all of the above answers are "NO", then may proceed with Cephalosporin use.   . Prednisone Hives    BP (!) 141/99   Pulse (!) 58   Temp 98.9 F (37.2 C) (Oral)   Resp 16   Ht 5\' 8"  (1.727 m)   Wt 79.4 kg (175 lb)   SpO2 100%   BMI 26.61 kg/m   Labs: No results found for this or any previous visit (from the past 48 hour(s)).  Imaging / Studies: No results found.   [name], M.D., F.A.C.S. Gastrointestinal and Minimally Invasive Surgery [facility] [address] [place], [place] [address] [phone] Main / Paging  08/10/2017 11:02 AM    Peter Chandler C.

## 2017-08-10 NOTE — Progress Notes (Signed)
In & out catheter done per order, 225ml of dark urine returned, Patient and patient's wife instructed that if he has any problems voiding after 8 hours to return to ED, wife  Verbalized understanding.

## 2017-08-11 ENCOUNTER — Encounter (HOSPITAL_COMMUNITY): Payer: Self-pay | Admitting: Surgery

## 2017-08-22 DIAGNOSIS — R509 Fever, unspecified: Secondary | ICD-10-CM | POA: Diagnosis not present

## 2017-08-23 ENCOUNTER — Other Ambulatory Visit (HOSPITAL_COMMUNITY): Payer: Self-pay | Admitting: Surgery

## 2017-08-23 ENCOUNTER — Other Ambulatory Visit: Payer: Self-pay | Admitting: Surgery

## 2017-08-23 ENCOUNTER — Encounter (HOSPITAL_COMMUNITY): Payer: Self-pay | Admitting: Surgery

## 2017-08-23 ENCOUNTER — Other Ambulatory Visit: Payer: Self-pay | Admitting: Radiology

## 2017-08-23 ENCOUNTER — Ambulatory Visit
Admission: RE | Admit: 2017-08-23 | Discharge: 2017-08-23 | Disposition: A | Discharge status: Home / Self Care | Payer: Medicare Other | Source: Ambulatory Visit | Attending: Surgery | Admitting: Surgery

## 2017-08-23 DIAGNOSIS — R509 Fever, unspecified: Secondary | ICD-10-CM

## 2017-08-23 DIAGNOSIS — T8149XA Infection following a procedure, other surgical site, initial encounter: Secondary | ICD-10-CM

## 2017-08-23 DIAGNOSIS — R1011 Right upper quadrant pain: Secondary | ICD-10-CM | POA: Diagnosis not present

## 2017-08-23 MED ORDER — IOPAMIDOL (ISOVUE-300) INJECTION 61%
100.0000 mL | Freq: Once | INTRAVENOUS | Status: AC | PRN
  Administered 2017-08-23: 100 mL via INTRAVENOUS

## 2017-08-24 ENCOUNTER — Ambulatory Visit (HOSPITAL_COMMUNITY)
Admission: RE | Admit: 2017-08-24 | Discharge: 2017-08-24 | Disposition: A | Discharge status: Home / Self Care | Payer: Medicare Other | Source: Ambulatory Visit | Attending: Surgery | Admitting: Surgery

## 2017-08-24 ENCOUNTER — Encounter (HOSPITAL_COMMUNITY): Payer: Self-pay

## 2017-08-24 DIAGNOSIS — Z9889 Other specified postprocedural states: Secondary | ICD-10-CM | POA: Insufficient documentation

## 2017-08-24 DIAGNOSIS — R509 Fever, unspecified: Secondary | ICD-10-CM | POA: Diagnosis not present

## 2017-08-24 DIAGNOSIS — Z8249 Family history of ischemic heart disease and other diseases of the circulatory system: Secondary | ICD-10-CM | POA: Insufficient documentation

## 2017-08-24 DIAGNOSIS — R310 Gross hematuria: Secondary | ICD-10-CM | POA: Diagnosis not present

## 2017-08-24 DIAGNOSIS — Z79891 Long term (current) use of opiate analgesic: Secondary | ICD-10-CM | POA: Diagnosis not present

## 2017-08-24 DIAGNOSIS — Z880 Allergy status to penicillin: Secondary | ICD-10-CM | POA: Diagnosis not present

## 2017-08-24 DIAGNOSIS — Z8041 Family history of malignant neoplasm of ovary: Secondary | ICD-10-CM | POA: Insufficient documentation

## 2017-08-24 DIAGNOSIS — Z85828 Personal history of other malignant neoplasm of skin: Secondary | ICD-10-CM | POA: Diagnosis not present

## 2017-08-24 DIAGNOSIS — Z79899 Other long term (current) drug therapy: Secondary | ICD-10-CM | POA: Insufficient documentation

## 2017-08-24 DIAGNOSIS — T8149XA Infection following a procedure, other surgical site, initial encounter: Secondary | ICD-10-CM | POA: Diagnosis not present

## 2017-08-24 DIAGNOSIS — Z791 Long term (current) use of non-steroidal anti-inflammatories (NSAID): Secondary | ICD-10-CM | POA: Insufficient documentation

## 2017-08-24 DIAGNOSIS — E785 Hyperlipidemia, unspecified: Secondary | ICD-10-CM | POA: Insufficient documentation

## 2017-08-24 DIAGNOSIS — Z9049 Acquired absence of other specified parts of digestive tract: Secondary | ICD-10-CM | POA: Insufficient documentation

## 2017-08-24 DIAGNOSIS — K8010 Calculus of gallbladder with chronic cholecystitis without obstruction: Secondary | ICD-10-CM | POA: Insufficient documentation

## 2017-08-24 DIAGNOSIS — N400 Enlarged prostate without lower urinary tract symptoms: Secondary | ICD-10-CM | POA: Insufficient documentation

## 2017-08-24 DIAGNOSIS — Z4682 Encounter for fitting and adjustment of non-vascular catheter: Secondary | ICD-10-CM | POA: Diagnosis not present

## 2017-08-24 HISTORY — PX: OTHER SURGICAL HISTORY: SHX169

## 2017-08-24 LAB — CBC WITH DIFFERENTIAL/PLATELET
BASOS PCT: 0 %
Basophils Absolute: 0 10*3/uL (ref 0.0–0.1)
Eosinophils Absolute: 0.1 10*3/uL (ref 0.0–0.7)
Eosinophils Relative: 0 %
HEMATOCRIT: 33.3 % — AB (ref 39.0–52.0)
HEMOGLOBIN: 11.4 g/dL — AB (ref 13.0–17.0)
Lymphocytes Relative: 9 %
Lymphs Abs: 1.3 10*3/uL (ref 0.7–4.0)
MCH: 30.6 pg (ref 26.0–34.0)
MCHC: 34.2 g/dL (ref 30.0–36.0)
MCV: 89.5 fL (ref 78.0–100.0)
MONOS PCT: 11 %
Monocytes Absolute: 1.5 10*3/uL — ABNORMAL HIGH (ref 0.1–1.0)
NEUTROS ABS: 10.8 10*3/uL — AB (ref 1.7–7.7)
NEUTROS PCT: 80 %
Platelets: 390 10*3/uL (ref 150–400)
RBC: 3.72 MIL/uL — ABNORMAL LOW (ref 4.22–5.81)
RDW: 13.8 % (ref 11.5–15.5)
WBC: 13.7 10*3/uL — ABNORMAL HIGH (ref 4.0–10.5)

## 2017-08-24 LAB — COMPREHENSIVE METABOLIC PANEL
ALK PHOS: 149 U/L — AB (ref 38–126)
ALT: 32 U/L (ref 17–63)
ANION GAP: 13 (ref 5–15)
AST: 32 U/L (ref 15–41)
Albumin: 2.9 g/dL — ABNORMAL LOW (ref 3.5–5.0)
BILIRUBIN TOTAL: 1.4 mg/dL — AB (ref 0.3–1.2)
BUN: 16 mg/dL (ref 6–20)
CALCIUM: 8.7 mg/dL — AB (ref 8.9–10.3)
CO2: 22 mmol/L (ref 22–32)
Chloride: 92 mmol/L — ABNORMAL LOW (ref 101–111)
Creatinine, Ser: 1.04 mg/dL (ref 0.61–1.24)
Glucose, Bld: 102 mg/dL — ABNORMAL HIGH (ref 65–99)
Potassium: 4.4 mmol/L (ref 3.5–5.1)
Sodium: 127 mmol/L — ABNORMAL LOW (ref 135–145)
TOTAL PROTEIN: 7.3 g/dL (ref 6.5–8.1)

## 2017-08-24 LAB — PROTIME-INR
INR: 1.24
PROTHROMBIN TIME: 15.5 s — AB (ref 11.4–15.2)

## 2017-08-24 MED ORDER — MIDAZOLAM HCL 2 MG/2ML IJ SOLN
INTRAMUSCULAR | Status: AC
  Filled 2017-08-24: qty 2

## 2017-08-24 MED ORDER — MIDAZOLAM HCL 2 MG/2ML IJ SOLN
INTRAMUSCULAR | Status: AC | PRN
  Administered 2017-08-24 (×2): 1 mg via INTRAVENOUS

## 2017-08-24 MED ORDER — FENTANYL CITRATE (PF) 100 MCG/2ML IJ SOLN
INTRAMUSCULAR | Status: AC
  Filled 2017-08-24: qty 2

## 2017-08-24 MED ORDER — SODIUM CHLORIDE 0.9% FLUSH: 5.0000 mL | Freq: Three times a day (TID) | INTRAVENOUS | Status: DC

## 2017-08-24 MED ORDER — LIDOCAINE-EPINEPHRINE (PF) 1 %-1:200000 IJ SOLN
INTRAMUSCULAR | Status: AC | PRN
  Administered 2017-08-24: 20 mL

## 2017-08-24 MED ORDER — SODIUM CHLORIDE 0.9 % IV SOLN
INTRAVENOUS | Status: DC
  Administered 2017-08-24: 11:00:00 via INTRAVENOUS

## 2017-08-24 MED ORDER — VANCOMYCIN HCL IN DEXTROSE 1-5 GM/200ML-% IV SOLN
1000.0000 mg | Freq: Once | INTRAVENOUS | Status: AC
  Administered 2017-08-24: 1000 mg via INTRAVENOUS
  Filled 2017-08-24: qty 200

## 2017-08-24 MED ORDER — FENTANYL CITRATE (PF) 100 MCG/2ML IJ SOLN
INTRAMUSCULAR | Status: AC | PRN
  Administered 2017-08-24 (×2): 50 ug via INTRAVENOUS

## 2017-08-24 MED ORDER — ACETAMINOPHEN 500 MG PO TABS
1000.0000 mg | ORAL_TABLET | Freq: Once | ORAL | Status: AC
  Administered 2017-08-24: 1000 mg via ORAL
  Filled 2017-08-24: qty 2

## 2017-08-24 NOTE — Procedures (Signed)
Pre procedural Dx: Post-op asbscess Post procedural Dx: Same  Technically successful CT guided placed of a 10 Fr drainage catheter placement into the gallbladder fossa yielding 120 cc of purulent appearing, slightly blood tinged fluid.    All aspirated samples sent to the laboratory for analysis.    EBL: None  Complications: None immediate  Ronny Bacon, MD Pager #: [phone]

## 2017-08-24 NOTE — Progress Notes (Addendum)
Gallbladder Fossa Drain right upper quadrant draining to gravity. Sanguinous drainage. Dressing CDI.   1550- drainage and dressing unchanged.  1650- cloudy sanguinous drainage. Notified Rowe Robert, PA. Dressing unchanged.  1720- drainage and dressing unchanged.

## 2017-08-24 NOTE — Progress Notes (Signed)
Pt seen prior to d/c; temp 102.9, BP stable; no rigors/chills; pt states he feels just as good if not slightly better than we he first presented; draining purulent blood-tinged fluid from RUQ drain; case d/w Drs. Gross/Ramirez and they feel pt is safe for d/c home at this time; pt seen by Dr. Pascal Lux as well and options for remaining inhouse vs d/c were d/w pt/wife; they are ok with d/c home today; they were instructed to contact surgery/IR with any additional questions and if sx's worsen report to [facility]. Pt has oral antibiotic therapy which he will begin tonight; he received IV vancomycin today following drain placement; he is scheduled to f/u with Dr. Johney Maine on 11/26; he will be scheduled to f/u in IR drain clinic in 2 weeks. Instructions for drain irrigation were reviewed with pt/wife- once daily with 5 cc sterile NS, record output.

## 2017-08-24 NOTE — Discharge Instructions (Signed)
Gallbladder Fossa Drainage Catheter Placement, Care After This sheet gives you information about how to care for yourself after your procedure. Your health care provider may also give you more specific instructions. If you have problems or questions, contact your health care provider. What can I expect after the procedure? After the procedure, it is common to have:  Pain or soreness at the catheter insertion site.  Tiredness and sleepiness for several hours.  Some bruising at the catheter insertion site.  Drainage into the collection bag on the outside of your body, if you have an external drainage catheter. ? You might see bloody discharge in the bag   Instructions for flushing catheter:  1. Twist off connection between drainage bag and catheter (connection leading from body) 2. Clean catheter with alcohol swab 3. Place 5 ml of saline flush into the catheter 4. Reconnect the connection to the drainage bag   Follow these instructions at home: Medicines  Take over-the-counter and prescription medicines for pain, discomfort, or fever only as told by your health care provider.  Do not take aspirin or blood thinners unless your health care provider says that you can. These can make bleeding worse.  Do not drive or use heavy machinery while taking prescription pain medicine. Catheter insertion site care  Clean the catheter insertion site as told by your health care provider.  Do not take baths, swim, or use a hot tub until your health care provider approves.  Take showers only. Before showering, cover the catheter insertion area with a watertight covering to keep the area dry.  Keep the skin around the catheter insertion site dry. If the area gets wet, dry the skin completely.  Check your catheter insertion site every day for signs of infection. Check for: ? Redness, swelling, or pain. ? Fluid or blood. ? Warmth. ? Pus or a bad smell. General instructions  Rest for the  remainder of the day.  Do not drive, use machinery, or make legal decisions for 24 hours after your procedure.  Resume your usual diet. Avoid alcoholic beverages for 24 hours after your procedure.  Keep all follow-up visits as told by your health care provider. This is important.  Drink enough fluid to keep your urine clear or pale yellow. Contact a health care provider if:  Your pain gets worse after it had improved, and it is not relieved with pain medicines.  You have any questions about caring for your drainage catheter or collection bag.  You have any of these around your catheter insertion site or coming from it: ? Skin breakdown. ? Redness, swelling, or pain. ? Fluid or blood. ? Warmth to the touch. ? Pus or a bad smell. Get help right away if:  You have a fever or chills.  Your redness, swelling, or pain at the catheter insertion site gets worse, even though you are cleaning it well.  Your drainage catheter becomes blocked or clogged.  Your drainage catheter comes out.  This information is not intended to replace advice given to you by your health care provider. Make sure you discuss any questions you have with your health care provider. Document Released: 05/04/2004 Document Revised: 08/09/2016 Document Reviewed: 08/09/2016 Elsevier Interactive Patient Education  2017 [place].   Moderate Conscious Sedation, Adult, Care After These instructions provide you with information about caring for yourself after your procedure. Your health care provider may also give you more specific instructions. Your treatment has been planned according to current medical practices, but problems  sometimes occur. Call your health care provider if you have any problems or questions after your procedure. What can I expect after the procedure? After your procedure, it is common:  To feel sleepy for several hours.  To feel clumsy and have poor balance for several hours.  To have poor  judgment for several hours.  To vomit if you eat too soon.  Follow these instructions at home: For at least 24 hours after the procedure:   Do not: ? Participate in activities where you could fall or become injured. ? Drive. ? Use heavy machinery. ? Drink alcohol. ? Take sleeping pills or medicines that cause drowsiness. ? Make important decisions or sign legal documents. ? Take care of children on your own.  Rest. Eating and drinking  Follow the diet recommended by your health care provider.  If you vomit: ? Drink water, juice, or soup when you can drink without vomiting. ? Make sure you have little or no nausea before eating solid foods. General instructions  Have a responsible adult stay with you until you are awake and alert.  Take over-the-counter and prescription medicines only as told by your health care provider.  If you smoke, do not smoke without supervision.  Keep all follow-up visits as told by your health care provider. This is important. Contact a health care provider if:  You keep feeling nauseous or you keep vomiting.  You feel light-headed.  You develop a rash.  You have a fever. Get help right away if:  You have trouble breathing. This information is not intended to replace advice given to you by your health care provider. Make sure you discuss any questions you have with your health care provider. Document Released: 07/11/2013 Document Revised: 02/23/2016 Document Reviewed: 01/10/2016 Elsevier Interactive Patient Education  [employer].

## 2017-08-24 NOTE — Consult Note (Signed)
Chief Complaint: Patient was seen in consultation today for image guided drainage of gallbladder fossa fluid collection  Referring Physician(s): Peter Chandler  Supervising Physician: Sandi Mariscal  Patient Status: [facility] - Out-pt  History of Present Illness: Peter Chandler is a 76 y.o. male status post laparoscopic cholecystectomy on 08/10/17 with findings of chronic cholecystitis/cholelithiasis and adherent fragments of hepatic parenchyma on pathology sample.  Patient now presents with history of right upper quadrant/epigastric tenderness, nausea, diminished appetite, recent fever/chills and finding of nearly 10 cm gas and fluid collection in gallbladder fossa suggestive of abscess.  He is scheduled today for image guided drainage of this gallbladder fossa fluid collection.  WBC today 13.7, temp 99.3.  Past Medical History:  Diagnosis Date  . Arthritis    "hands" (05/13/2016)  . Basal cell carcinoma    "face, back"  . Bleeding per rectum 05/13/2016  . BPH (benign prostatic hyperplasia) 09/2015  . Cholelithiasis 09/2015   noted on CT done by urology for his gross hematuria w/u--pt was referred to gen surg by Dr. Ralene Muskrat office  . Chronic thrombocytopenic purpura ([place])    Peter Chandler 05/13/2016  . Diplopia 2017   left eye.  glasses lens fitted with a prism  . Gross hematuria 09/2015   Painless.  cystoscopy ~ 09/2015, Dr Jeffie Pollock.  Conclusion (per pt) is source of blood is the prostate.    . Hyperlipidemia     Past Surgical History:  Procedure Laterality Date  . COLONOSCOPY  11/05/2002   Internal hemorrhoids, hyperplastic sigmoid polyp, ascending erythema, descending venous lake, hypertrophied papilla. Dr. Aurelio Jew,   . COLONOSCOPY N/A 05/14/2016   per Dr. Carlean Purl, hypertrophied anal papillae but no polyps, no repeats needed   . CYSTOSCOPY  ~ 09/2015   to eval gross hematuria.  Dr Jeffie Pollock at [facility] urology.   Marland Kitchen LAPAROSCOPIC CHOLECYSTECTOMY SINGLE SITE WITH INTRAOPERATIVE CHOLANGIOGRAM  N/A 08/10/2017   Procedure: LAPAROSCOPIC CHOLECYSTECTOMY SINGLE SITE WITH INTRAOPERATIVE CHOLANGIOGRAM;  Surgeon: Michael Boston, MD;  Location: [facility] ORS;  Service: General;  Laterality: N/A;    Allergies: Penicillins and Prednisone  Medications: Prior to Admission medications   Medication Sig Start Date End Date Taking? Authorizing Provider  acetaminophen (TYLENOL) 500 MG tablet Take 500 mg by mouth every 6 (six) hours as needed for fever.   Yes [provider]  ibuprofen (ADVIL,MOTRIN) 200 MG tablet Take 200 mg by mouth every 6 (six) hours as needed for headache or moderate pain.   Yes [provider]  traMADol (ULTRAM) 50 MG tablet Take 1-2 tablets (50-100 mg total) every 6 (six) hours as needed by mouth for moderate pain or severe pain. 08/10/17  Liston Alba, MD     Family History  Problem Relation Age of Onset  . Ovarian cancer Unknown   . Heart disease Unknown   . Heart disease Father 32       Died age 51    Social History   Socioeconomic History  . Marital status: Married    Spouse name: None  . Number of children: None  . Years of education: None  . Highest education level: None  Social Needs  . Financial resource strain: None  . Food insecurity - worry: None  . Food insecurity - inability: None  . Transportation needs - medical: None  . Transportation needs - non-medical: None  Occupational History  . None  Tobacco Use  . Smoking status: Never Smoker  . Smokeless tobacco: Never Used  Substance and Sexual Activity  .  Alcohol use: No    Alcohol/week: 0.0 oz  . Drug use: No  . Sexual activity: Yes  Other Topics Concern  . None  Social History Narrative   Lives with wife.  Retired Company secretary.        Review of Systems see above Vital Signs: BP 140/88 (BP Location: Left Arm)   Pulse 90   Temp 99.3 F (37.4 C) (Oral)   Resp 18   Ht 5\' 8"  (1.727 m)   Wt 170 lb (77.1 kg)   SpO2 100%   BMI 25.85 kg/m   Physical Exam awake, alert.   Chest clear to auscultation bilaterally.  Heart with regular rate and rhythm.  Abdomen soft, positive bowel sounds, tender right upper quadrant/epigastric region, no lower extremity edema.   Imaging: Dg Cholangiogram Operative  Result Date: 08/10/2017 CLINICAL DATA:  Intraoperative cholangiogram during laparoscopic cholecystectomy. EXAM: INTRAOPERATIVE CHOLANGIOGRAM FLUOROSCOPY TIME:  Eighteen seconds (1.7 mGy) COMPARISON:  Abdominal ultrasound - 06/14/2017 FINDINGS: Intraoperative cholangiographic images of the right upper abdominal quadrant during laparoscopic cholecystectomy are provided for review. Surgical clips overlie the expected location of the gallbladder fossa. Contrast injection demonstrates selective cannulation of the central aspect of the cystic duct. There is passage of contrast through the central aspect of the cystic duct with filling of a non dilated common bile duct. There is passage of contrast though the CBD and into the descending portion of the duodenum. There is minimal reflux of injected contrast into the common hepatic duct and central aspect of the non dilated intrahepatic biliary system. There are no discrete filling defects within the opacified portions of the biliary system to suggest the presence of choledocholithiasis. IMPRESSION: No evidence of choledocholithiasis. Electronically Signed   By: Sandi Mariscal M.D.   On: 08/10/2017 13:17   Ct Abdomen Pelvis W Contrast  Result Date: 08/23/2017 CLINICAL DATA:  Fever and chills. Right upper quadrant pain for 2 weeks. EXAM: CT ABDOMEN AND PELVIS WITH CONTRAST TECHNIQUE: Multidetector CT imaging of the abdomen and pelvis was performed using the standard protocol following bolus administration of intravenous contrast. CONTRAST:  143mL ISOVUE-300 IOPAMIDOL (ISOVUE-300) INJECTION 61% COMPARISON:  05/13/2016 FINDINGS: Lower chest: Coronary atherosclerotic calcification. Mild atelectasis at the right base. Hepatobiliary: Gas and fluid  collection in the gallbladder fossa measuring up to 10 cm. Indistinct margin or at the collection contacts the hepatic flexure. Gas at the hepatic hilum tracking towards the duodenal groove, that appears lateral to the normal diameter common bile duct. No intrahepatic pneumobilia seen. No available CBC or CMP. 2 cm cyst at the liver dome that is stable from 2017. Tiny low-density in the posterior right liver that is also stable. Pancreas: Unremarkable. Spleen: Unremarkable. Adrenals/Urinary Tract: Negative adrenals. No hydronephrosis or stone. Unremarkable bladder. Stomach/Bowel: Secondary appearing focal colitis at the hepatic flexure. No appendicitis. Vascular/Lymphatic: Atherosclerosis of the aorta and branch vessels. Notable irregular plaque involving the mid SMA, with stenosis. No mass or adenopathy. Reproductive:No pathologic findings. Other: No ascites or pneumoperitoneum. Negative for ascites. Expected changes of laparoscopic surgery. Musculoskeletal: No acute abnormalities. Disc and facet degeneration. Advanced L1-2 disc narrowing with endplate sclerosis. These results were called by telephone at the time of interpretation on 08/23/2017 at 10:05 am to Dr. Michael Boston , who verbally acknowledged these results. IMPRESSION: 1. Nearly 10 cm gas and fluid collection in the gallbladder fossa with history suggesting abscess. There is close contact with the secondarily inflamed hepatic flexure; no convincing colon fistulization. 2. Atherosclerosis.  Notable irregular plaque in the mid  SMA. Electronically Signed   By: Monte Fantasia M.D.   On: 08/23/2017 10:17    Labs:  CBC: Recent Labs    08/08/17 1019 08/24/17 1125  WBC 5.8 13.7*  HGB 13.1 11.4*  HCT 39.6 33.3*  PLT 144* 390    COAGS: Recent Labs    08/24/17 1125  INR 1.24    BMP: Recent Labs    03/15/17 1023 08/08/17 1019 08/24/17 1125  NA  --  138 127*  K  --  4.7 4.4  CL  --  104 92*  CO2  --  26 22  GLUCOSE  --  97 102*  BUN   --  16 16  CALCIUM  --  8.8* 8.7*  CREATININE 1.20 1.16 1.04  GFRNONAA  --  58* >60  GFRAA  --  >60 >60    LIVER FUNCTION TESTS: Recent Labs    08/24/17 1125  BILITOT 1.4*  AST 32  ALT 32  ALKPHOS 149*  PROT 7.3  ALBUMIN 2.9*    TUMOR MARKERS: No results for input(s): AFPTM, CEA, CA199, CHROMGRNA in the last 8760 hours.  Assessment and Plan: 76 y.o. male status post laparoscopic cholecystectomy on 08/10/17 with findings of chronic cholecystitis/cholelithiasis and adherent fragments of hepatic parenchyma on pathology sample.  Patient now presents with history of right upper quadrant/epigastric tenderness, nausea, diminished appetite, recent fever/chills and finding of nearly 10 cm gas and fluid collection in gallbladder fossa suggestive of abscess.  He is scheduled today for image guided drainage of this gallbladder fossa fluid collection.  WBC today 13.7, temp 99.3.  Latest imaging studies have been reviewed by Dr. Pascal Lux.Risks and benefits discussed with the patient/spouse including bleeding, infection, damage to adjacent structures, bowel perforation/fistula connection, and sepsis.All of the patient's questions were answered, patient is agreeable to proceed.Consent signed and in chart.     Thank you for this interesting consult.  I greatly enjoyed meeting Peter Chandler and look forward to participating in their care.  A copy of this report was sent to the requesting provider on this date.  Electronically Signed: D. Rowe Robert, PA-C 08/24/2017, 12:23 PM   I spent a total of 25 minutes  in face to face in clinical consultation, greater than 50% of which was counseling/coordinating care for image guided drainage of gallbladder fossa fluid collection

## 2017-08-26 ENCOUNTER — Emergency Department (HOSPITAL_COMMUNITY): Payer: Medicare Other

## 2017-08-26 ENCOUNTER — Emergency Department (HOSPITAL_COMMUNITY)
Admission: EM | Admit: 2017-08-26 | Discharge: 2017-08-26 | Disposition: A | Discharge status: Home / Self Care | Payer: Medicare Other | Attending: Emergency Medicine | Admitting: Emergency Medicine

## 2017-08-26 ENCOUNTER — Other Ambulatory Visit: Payer: Self-pay

## 2017-08-26 ENCOUNTER — Encounter (HOSPITAL_COMMUNITY): Payer: Self-pay

## 2017-08-26 DIAGNOSIS — R1011 Right upper quadrant pain: Secondary | ICD-10-CM | POA: Diagnosis present

## 2017-08-26 DIAGNOSIS — R109 Unspecified abdominal pain: Secondary | ICD-10-CM | POA: Diagnosis not present

## 2017-08-26 DIAGNOSIS — Z85828 Personal history of other malignant neoplasm of skin: Secondary | ICD-10-CM | POA: Insufficient documentation

## 2017-08-26 DIAGNOSIS — K651 Peritoneal abscess: Secondary | ICD-10-CM | POA: Insufficient documentation

## 2017-08-26 LAB — COMPREHENSIVE METABOLIC PANEL
ALBUMIN: 2.8 g/dL — AB (ref 3.5–5.0)
ALK PHOS: 134 U/L — AB (ref 38–126)
ALT: 27 U/L (ref 17–63)
AST: 26 U/L (ref 15–41)
Anion gap: 12 (ref 5–15)
BUN: 14 mg/dL (ref 6–20)
CALCIUM: 8.8 mg/dL — AB (ref 8.9–10.3)
CHLORIDE: 93 mmol/L — AB (ref 101–111)
CO2: 26 mmol/L (ref 22–32)
Creatinine, Ser: 1.02 mg/dL (ref 0.61–1.24)
GFR calc Af Amer: >60 mL/min (ref >60)
GFR calc non Af Amer: >60 mL/min (ref >60)
Glucose, Bld: 156 mg/dL — ABNORMAL HIGH (ref 65–99)
POTASSIUM: 3.8 mmol/L (ref 3.5–5.1)
SODIUM: 131 mmol/L — AB (ref 135–145)
Total Bilirubin: 0.8 mg/dL (ref 0.3–1.2)
Total Protein: 7.7 g/dL (ref 6.5–8.1)

## 2017-08-26 LAB — URINALYSIS, ROUTINE W REFLEX MICROSCOPIC
BACTERIA UA: NONE SEEN
BILIRUBIN URINE: NEGATIVE
Glucose, UA: NEGATIVE mg/dL
Hgb urine dipstick: NEGATIVE
KETONES UR: NEGATIVE mg/dL
Nitrite: NEGATIVE
PH: 5 (ref 5.0–8.0)
PROTEIN: NEGATIVE mg/dL
Specific Gravity, Urine: 1.028 (ref 1.005–1.030)

## 2017-08-26 LAB — CBC
HEMATOCRIT: 35.4 % — AB (ref 39.0–52.0)
HEMOGLOBIN: 12.2 g/dL — AB (ref 13.0–17.0)
MCH: 30.9 pg (ref 26.0–34.0)
MCHC: 34.5 g/dL (ref 30.0–36.0)
MCV: 89.6 fL (ref 78.0–100.0)
Platelets: 407 10*3/uL — ABNORMAL HIGH (ref 150–400)
RBC: 3.95 MIL/uL — AB (ref 4.22–5.81)
RDW: 13.9 % (ref 11.5–15.5)
WBC: 10.9 10*3/uL — ABNORMAL HIGH (ref 4.0–10.5)

## 2017-08-26 LAB — LIPASE, BLOOD: Lipase: 24 U/L (ref 11–51)

## 2017-08-26 MED ORDER — SODIUM CHLORIDE 0.9 % IV BOLUS (SEPSIS)
1000.0000 mL | Freq: Once | INTRAVENOUS | Status: AC
  Administered 2017-08-26: 1000 mL via INTRAVENOUS

## 2017-08-26 MED ORDER — HYDROCODONE-ACETAMINOPHEN 5-325 MG PO TABS
1.0000 | ORAL_TABLET | Freq: Once | ORAL | Status: AC
  Administered 2017-08-26: 1 via ORAL
  Filled 2017-08-26: qty 1

## 2017-08-26 MED ORDER — METOCLOPRAMIDE HCL 10 MG PO TABS: 10.0000 mg | ORAL_TABLET | Freq: Four times a day (QID) | ORAL | 0 refills | Status: DC | PRN

## 2017-08-26 MED ORDER — AZTREONAM 2 G IJ SOLR
2.0000 g | Freq: Once | INTRAMUSCULAR | Status: DC
  Filled 2017-08-26: qty 2

## 2017-08-26 MED ORDER — VANCOMYCIN HCL 10 G IV SOLR
1500.0000 mg | Freq: Once | INTRAVENOUS | Status: DC
  Filled 2017-08-26: qty 1500

## 2017-08-26 MED ORDER — HYDROCODONE-ACETAMINOPHEN 5-325 MG PO TABS: 1.0000 | ORAL_TABLET | ORAL | 0 refills | Status: DC | PRN

## 2017-08-26 MED ORDER — IOPAMIDOL (ISOVUE-300) INJECTION 61%
100.0000 mL | Freq: Once | INTRAVENOUS | Status: AC | PRN
  Administered 2017-08-26: 100 mL via INTRAVENOUS

## 2017-08-26 MED ORDER — IOPAMIDOL (ISOVUE-300) INJECTION 61%
INTRAVENOUS | Status: AC
  Administered 2017-08-26: 14:00:00
  Filled 2017-08-26: qty 100

## 2017-08-26 MED ORDER — ONDANSETRON HCL 4 MG/2ML IJ SOLN
4.0000 mg | Freq: Once | INTRAMUSCULAR | Status: AC
  Administered 2017-08-26: 4 mg via INTRAVENOUS
  Filled 2017-08-26: qty 2

## 2017-08-26 NOTE — ED Triage Notes (Signed)
Patient c/o right mid/upper abdominal pain. Patient had cholecystectomy on 08/10/17 and has a drainage bag from site. Patient states he has increased pain and nausea.

## 2017-08-26 NOTE — ED Provider Notes (Signed)
5:36 PM Pt is feeling better.  Pain and nausea medications have helped. Ready to go home.   Dorie Rank, MD 08/26/17 [phone]

## 2017-08-26 NOTE — Discharge Instructions (Signed)
You need to return if you start vomiting and can't hold down anything or start running high fevers again.  Continue drinking fluids and resting.  Use the incentive spirometer daily to prevent pneumonia.  Take pain medication to help you control your pain and take deep breaths.

## 2017-08-26 NOTE — ED Provider Notes (Signed)
[facility] DEPT Provider Note   CSN: [id number] Arrival date & time: 08/26/17  1202     History   Chief Complaint Chief Complaint  Patient presents with  . Abdominal Pain    HPI Peter Chandler is a 76 y.o. male.  Patient is a 76 year old elderly gentleman with a significant past medical history of recent cholecystectomy which then became infected with abscess requiring IR drain placement 3 days ago who is presenting due to persistent fevers, worsening pain and nausea and feeling that he is getting worse.  Patient was getting IV vancomycin while hospitalized and since he went home on Wednesday evening he has been taking Cipro and Flagyl twice daily but states he is not feeling better.  Last fever was at 2:00 this morning which was 101.  He has been able to eat and drink very little but has had flatus and intermittent bowel movements without difficulty.  He has noticed worsening pain in the right upper quadrant.  His drain continues to drain.  Yesterday he emptied 100 mL's from the bag and he has approximately 57mL in there currently and is not emptied the bag today.  He attempted to get a hold of his surgeon but was not able to get a hold of anybody.  He was then able to get a hold of interventional radiology who recommended he come to the emergency room for further evaluation.   The history is provided by the patient.    Past Medical History:  Diagnosis Date  . Arthritis    "hands" (05/13/2016)  . Basal cell carcinoma    "face, back"  . Bleeding per rectum 05/13/2016  . BPH (benign prostatic hyperplasia) 09/2015  . Cholelithiasis 09/2015   noted on CT done by urology for his gross hematuria w/u--pt was referred to gen surg by Dr. Ralene Muskrat office  . Chronic thrombocytopenic purpura ([place])    [name] 05/13/2016  . Diplopia 2017   left eye.  glasses lens fitted with a prism  . Gross hematuria 09/2015   Painless.  cystoscopy ~ 09/2015, Dr Jeffie Pollock.   Conclusion (per pt) is source of blood is the prostate.    . Hyperlipidemia     Patient Active Problem List   Diagnosis Date Noted  . Acute on chronic cholecystitis s/p lap cholecystectomy 08/10/2017 08/10/2017  . Murmur 07/25/2017  . Preop cardiovascular exam 07/25/2017  . Rectal bleeding   . Hemorrhoids, internal, with bleeding   . Hematochezia 05/13/2016  . BPH (benign prostatic hyperplasia) 05/13/2016  . Thrombocytopenia ([place]) 05/13/2016  . Elevated blood pressure reading without diagnosis of hypertension 05/13/2016  . Shingles 09/14/2012  . HLD (hyperlipidemia) 07/25/2007  . SYMPTOM, JOINT NEC, HAND 07/25/2007  . TB SKIN TEST, POSITIVE 07/25/2007  . SYNCOPE, HX OF 07/25/2007    Past Surgical History:  Procedure Laterality Date  . COLONOSCOPY  11/05/2002   Internal hemorrhoids, hyperplastic sigmoid polyp, ascending erythema, descending venous lake, hypertrophied papilla. Dr. Aurelio Jew, [place]  . COLONOSCOPY N/A 05/14/2016   per Dr. Carlean Purl, hypertrophied anal papillae but no polyps, no repeats needed   . CYSTOSCOPY  ~ 09/2015   to eval gross hematuria.  Dr Jeffie Pollock at [facility] urology.   Marland Kitchen LAPAROSCOPIC CHOLECYSTECTOMY SINGLE SITE WITH INTRAOPERATIVE CHOLANGIOGRAM N/A 08/10/2017   Procedure: LAPAROSCOPIC CHOLECYSTECTOMY SINGLE SITE WITH INTRAOPERATIVE CHOLANGIOGRAM;  Surgeon: Michael Boston, MD;  Location: [facility] ORS;  Service: General;  Laterality: N/A;       Home Medications    Prior to Admission  medications   Medication Sig Start Date End Date Taking? Authorizing Provider  acetaminophen (TYLENOL) 500 MG tablet Take 500-1,000 mg by mouth every 6 (six) hours as needed for mild pain, fever or headache.    Yes [provider]  ciprofloxacin (CIPRO) 500 MG tablet Take 500 mg by mouth 2 (two) times daily. Started 11/21 for 10 days   Yes [provider]  metroNIDAZOLE (FLAGYL) 500 MG tablet Take 500 mg by mouth 2 (two) times daily. Started 11/21 for 10 days   Yes  [provider]  traMADol (ULTRAM) 50 MG tablet Take 1-2 tablets (50-100 mg total) every 6 (six) hours as needed by mouth for moderate pain or severe pain. 08/10/17  Yes Michael Boston, MD  ibuprofen (ADVIL,MOTRIN) 200 MG tablet Take 200 mg by mouth every 6 (six) hours as needed for headache or moderate pain.    [provider]    Family History Family History  Problem Relation Age of Onset  . Ovarian cancer Unknown   . Heart disease Unknown   . Heart disease Father 24       Died age 40    Social History Social History   Tobacco Use  . Smoking status: Never Smoker  . Smokeless tobacco: Never Used  Substance Use Topics  . Alcohol use: No    Alcohol/week: 0.0 oz  . Drug use: No     Allergies   Penicillins and Prednisone   Review of Systems Review of Systems  All other systems reviewed and are negative.    Physical Exam Updated Vital Signs BP 139/83 (BP Location: Left Arm)   Pulse (!) 113   Temp 98.3 F (36.8 C) (Oral)   Resp 16   Ht 5\' 8"  (1.727 m)   Wt 77.1 kg (170 lb)   SpO2 99%   BMI 25.85 kg/m   Physical Exam  Constitutional: He is oriented to person, place, and time. He appears well-developed and well-nourished. No distress.  HENT:  Head: Normocephalic and atraumatic.  Mouth/Throat: Oropharynx is clear and moist.  Eyes: Conjunctivae and EOM are normal. Pupils are equal, round, and reactive to light.  Neck: Normal range of motion. Neck supple.  Cardiovascular: Normal rate, regular rhythm and intact distal pulses.  No murmur heard. Pulmonary/Chest: Effort normal and breath sounds normal. No respiratory distress. He has no wheezes. He has no rales.  Abdominal: Soft. He exhibits no distension. There is tenderness in the right upper quadrant. There is guarding. There is no rebound.  Musculoskeletal: Normal range of motion. He exhibits no edema or tenderness.  Neurological: He is alert and oriented to person, place, and time.  Skin: Skin is  warm and dry. No rash noted. No erythema.  Psychiatric: He has a normal mood and affect. His behavior is normal.  Nursing note and vitals reviewed.    ED Treatments / Results  Labs (all labs ordered are listed, but only abnormal results are displayed) Labs Reviewed  COMPREHENSIVE METABOLIC PANEL - Abnormal; Notable for the following components:      Result Value   Sodium 131 (*)    Chloride 93 (*)    Glucose, Bld 156 (*)    Calcium 8.8 (*)    Albumin 2.8 (*)    Alkaline Phosphatase 134 (*)    All other components within normal limits  CBC - Abnormal; Notable for the following components:   WBC 10.9 (*)    RBC 3.95 (*)    Hemoglobin 12.2 (*)  HCT 35.4 (*)    Platelets 407 (*)    All other components within normal limits  URINALYSIS, ROUTINE W REFLEX MICROSCOPIC - Abnormal; Notable for the following components:   Color, Urine AMBER (*)    Leukocytes, UA SMALL (*)    Squamous Epithelial / LPF 0-5 (*)    All other components within normal limits  LIPASE, BLOOD    EKG  EKG Interpretation None       Radiology Ct Image Guided Drainage By Percutaneous Catheter  Result Date: 08/24/2017 INDICATION: History of cholecystectomy, history of laparoscopic cholecystectomy on 08/10/2017 with findings of chronic cholecystitis/cholelithiasis, now with recurrent right upper quadrant abdominal pain, fever and chills. CT scan performed of the abdomen and pelvis on 10 07/23/2017 demonstrates a large air and fluid collection within the gallbladder fossa worrisome for an abscess. As such, request made for CT-guided percutaneous drainage catheter placement for infection source control purposes. EXAM: CT IMAGE GUIDED DRAINAGE BY PERCUTANEOUS CATHETER COMPARISON:  CT abdomen pelvis - 08/23/2017; 05/13/2017 MEDICATIONS: The patient is currently admitted to the hospital and receiving intravenous antibiotics. The antibiotics were administered within an appropriate time frame prior to the initiation of  the procedure. ANESTHESIA/SEDATION: Moderate (conscious) sedation was employed during this procedure. A total of Versed 2 mg and Fentanyl 100 mcg was administered intravenously. Moderate Sedation Time: 21 minutes. The patient's level of consciousness and vital signs were monitored continuously by radiology nursing throughout the procedure under my direct supervision. CONTRAST:  None COMPLICATIONS: None immediate. PROCEDURE: Informed written consent was obtained from the patient after a discussion of the risks, benefits and alternatives to treatment. The patient was placed supine on the CT gantry and a pre procedural CT was performed re-demonstrating the known abscess/fluid collection within the gallbladder fossa with dominant component measuring at least 7.0 x 6.8 cm (image 28, series 2). The procedure was planned. A timeout was performed prior to the initiation of the procedure. The skin overlying the right lateral abdomen was prepped and draped in the usual sterile fashion. The overlying soft tissues were anesthetized with 1% lidocaine with epinephrine. Appropriate trajectory was planned with the use of a 22 gauge spinal needle. An 18 gauge trocar needle was advanced into the abscess/fluid collection and a short Amplatz super stiff wire was coiled within the collection. Appropriate positioning was confirmed with a limited CT scan. The tract was serially dilated allowing placement of a 10 [place] all-purpose drainage catheter. Appropriate positioning was confirmed with a limited postprocedural CT scan. Approximately 125 mL of purulent, slightly bloody fluid was aspirated. The tube was connected to a drainage bag and sutured in place. A dressing was placed. The patient tolerated the procedure well without immediate post procedural complication. IMPRESSION: Successful CT guided placement of a 10 French all purpose drain catheter into the gallbladder fossa abscess with aspiration of 1025 mL of purulent fluid. Samples  were sent to the laboratory as requested by the ordering clinical team. Electronically Signed   By: Sandi Mariscal M.D.   On: 08/24/2017 15:41    Procedures Procedures (including critical care time)  Medications Ordered in ED Medications - No data to display   Initial Impression / Assessment and Plan / ED Course  I have reviewed the triage vital signs and the nursing notes.  Pertinent labs & imaging results that were available during my care of the patient were reviewed by me and considered in my medical decision making (see chart for details).     Patient with recent complicated  surgery with wound infection requiring drain placement 3 days ago presenting today because of feeling worse.  He is having worsening right upper quadrant pain despite drain being in place and draining, persistent fever.  He denies any vomiting.  He denies any urinary symptoms, but states that he has had cough over the last 2-3 days and felt SOB today.  He has been taking Cipro and Flagyl as prescribed.  The patient has pain in the right upper quadrant on exam.  Insertion site of the drain is clean dry and intact without evidence of cellulitis.  He does not have pain in the other quadrants of his abdomen.  Concern for possible worsening infection or new abscess. Or lung pathology.  CBC, CMP and CT of the abdomen and pelvis with contrast pending.  2:42 PM CMP without acute findings, lipase within normal limits, CBC with improved leukocytosis now at 11,000.  CT shows improved size of the gallbladder fossa abscess with good drain placement but loculated 4.1cm collection still present however patient has a new small right pleural effusion with adjacent consolidation which is concerning for pneumonia vs atelectasis given patient's above symptoms.   Will discuss with surgery for further advise in management.  4:17 PM Spoke with Dr. Harlow Asa who states it appears that pt's symptoms are getting better and no signs that require  admission with improving abscess.  Pt is tolerating po's and at this times feel fluid in the right lung is related to atelectasis due to poor pain control.  Pt is not taking anything for pain.  Was given tramadol but not taking it due to it made him feel funny.   Patient was given an incentive spirometer here.  Given a dose of Vicodin to make sure he tolerated it and it improved his pain.  Patient has a follow-up with Dr. gross on Monday.  Also stressed with the patient and his wife if he were to start running high-grade fevers again, uncontrollable vomiting that he should return immediately.  Final Clinical Impressions(s) / ED Diagnoses   Final diagnoses:  Intra-abdominal abscess [facility])    ED Discharge Orders        Ordered    HYDROcodone-acetaminophen (NORCO/VICODIN) 5-325 MG tablet  Every 4 hours PRN     08/26/17 1616       Blanchie Dessert, MD 08/26/17 1620

## 2017-08-26 NOTE — Progress Notes (Signed)
A consult was received from an ED physician for vancomycin per pharmacy dosing.  The patient's profile has been reviewed for ht/wt/allergies/indication/available labs.   A one time order has been placed for vancomycin 1500mg  IV x 1.  Further antibiotics/pharmacy consults should be ordered by admitting physician if indicated.                       Thank you, Peggyann Juba, PharmD, BCPS 08/26/2017  2:47 PM

## 2017-08-29 ENCOUNTER — Other Ambulatory Visit: Payer: Self-pay

## 2017-08-29 ENCOUNTER — Encounter (HOSPITAL_COMMUNITY): Payer: Self-pay | Admitting: *Deleted

## 2017-08-29 DIAGNOSIS — K839 Disease of biliary tract, unspecified: Secondary | ICD-10-CM

## 2017-08-29 LAB — AEROBIC/ANAEROBIC CULTURE (SURGICAL/DEEP WOUND)

## 2017-08-29 LAB — AEROBIC/ANAEROBIC CULTURE W GRAM STAIN (SURGICAL/DEEP WOUND): Special Requests: NORMAL

## 2017-08-29 MED FILL — NORMAL SALINE FLUSH SYRINGE: 0.9 | 30 days supply | Qty: 300 | Fill #0

## 2017-08-29 NOTE — Progress Notes (Signed)
Patient notified of ERCP with stent placement for tomorrow at [facility] at 10:15.  (case # [id number] [id number] scheduled with [facility]). Patient notified to be NPO after midnight and arrive in admitting at 8:45 am.

## 2017-08-30 ENCOUNTER — Ambulatory Visit (HOSPITAL_COMMUNITY): Payer: Medicare Other | Admitting: Certified Registered Nurse Anesthetist

## 2017-08-30 ENCOUNTER — Telehealth: Payer: Self-pay | Admitting: Gastroenterology

## 2017-08-30 ENCOUNTER — Other Ambulatory Visit: Payer: Self-pay | Admitting: Radiology

## 2017-08-30 ENCOUNTER — Other Ambulatory Visit: Payer: Self-pay | Admitting: Surgery

## 2017-08-30 ENCOUNTER — Other Ambulatory Visit: Payer: Self-pay

## 2017-08-30 ENCOUNTER — Ambulatory Visit (HOSPITAL_COMMUNITY): Payer: Medicare Other

## 2017-08-30 ENCOUNTER — Encounter (HOSPITAL_COMMUNITY)
Admission: RE | Disposition: A | Discharge status: Home / Self Care | Payer: Self-pay | Source: Ambulatory Visit | Attending: Gastroenterology

## 2017-08-30 ENCOUNTER — Encounter (HOSPITAL_COMMUNITY): Payer: Self-pay | Admitting: Emergency Medicine

## 2017-08-30 ENCOUNTER — Ambulatory Visit (HOSPITAL_COMMUNITY)
Admission: RE | Admit: 2017-08-30 | Discharge: 2017-08-30 | Disposition: A | Discharge status: Home / Self Care | Payer: Medicare Other | Source: Ambulatory Visit | Attending: Gastroenterology | Admitting: Gastroenterology

## 2017-08-30 DIAGNOSIS — Q445 Other congenital malformations of bile ducts: Secondary | ICD-10-CM | POA: Insufficient documentation

## 2017-08-30 DIAGNOSIS — T8149XA Infection following a procedure, other surgical site, initial encounter: Secondary | ICD-10-CM | POA: Diagnosis not present

## 2017-08-30 DIAGNOSIS — K5710 Diverticulosis of small intestine without perforation or abscess without bleeding: Secondary | ICD-10-CM | POA: Diagnosis not present

## 2017-08-30 DIAGNOSIS — K9189 Other postprocedural complications and disorders of digestive system: Secondary | ICD-10-CM | POA: Diagnosis not present

## 2017-08-30 DIAGNOSIS — Z9049 Acquired absence of other specified parts of digestive tract: Secondary | ICD-10-CM | POA: Insufficient documentation

## 2017-08-30 DIAGNOSIS — R21 Rash and other nonspecific skin eruption: Secondary | ICD-10-CM | POA: Diagnosis not present

## 2017-08-30 DIAGNOSIS — K839 Disease of biliary tract, unspecified: Secondary | ICD-10-CM

## 2017-08-30 DIAGNOSIS — K838 Other specified diseases of biliary tract: Secondary | ICD-10-CM | POA: Diagnosis not present

## 2017-08-30 DIAGNOSIS — K8050 Calculus of bile duct without cholangitis or cholecystitis without obstruction: Secondary | ICD-10-CM | POA: Diagnosis not present

## 2017-08-30 DIAGNOSIS — D696 Thrombocytopenia, unspecified: Secondary | ICD-10-CM | POA: Diagnosis not present

## 2017-08-30 DIAGNOSIS — R188 Other ascites: Secondary | ICD-10-CM

## 2017-08-30 DIAGNOSIS — Y838 Other surgical procedures as the cause of abnormal reaction of the patient, or of later complication, without mention of misadventure at the time of the procedure: Secondary | ICD-10-CM | POA: Diagnosis not present

## 2017-08-30 DIAGNOSIS — E785 Hyperlipidemia, unspecified: Secondary | ICD-10-CM | POA: Diagnosis not present

## 2017-08-30 DIAGNOSIS — R011 Cardiac murmur, unspecified: Secondary | ICD-10-CM | POA: Diagnosis not present

## 2017-08-30 HISTORY — DX: Other complications of anesthesia, initial encounter: T88.59XA

## 2017-08-30 HISTORY — PX: ENDOSCOPIC RETROGRADE CHOLANGIOPANCREATOGRAPHY (ERCP) WITH PROPOFOL: SHX5810

## 2017-08-30 HISTORY — DX: Adverse effect of unspecified anesthetic, initial encounter: T41.45XA

## 2017-08-30 SURGERY — ENDOSCOPIC RETROGRADE CHOLANGIOPANCREATOGRAPHY (ERCP) WITH PROPOFOL
Anesthesia: General

## 2017-08-30 MED ORDER — PROPOFOL 10 MG/ML IV BOLUS
INTRAVENOUS | Status: AC
  Filled 2017-08-30: qty 40

## 2017-08-30 MED ORDER — LIDOCAINE 2% (20 MG/ML) 5 ML SYRINGE
INTRAMUSCULAR | Status: AC
  Filled 2017-08-30: qty 5

## 2017-08-30 MED ORDER — FENTANYL CITRATE (PF) 100 MCG/2ML IJ SOLN
25.0000 ug | INTRAMUSCULAR | Status: DC | PRN
  Administered 2017-08-30 (×2): 50 ug via INTRAVENOUS

## 2017-08-30 MED ORDER — PROPOFOL 10 MG/ML IV BOLUS
INTRAVENOUS | Status: DC | PRN
  Administered 2017-08-30: 150 mg via INTRAVENOUS

## 2017-08-30 MED ORDER — ONDANSETRON HCL 4 MG/2ML IJ SOLN
INTRAMUSCULAR | Status: AC
  Filled 2017-08-30: qty 2

## 2017-08-30 MED ORDER — LACTATED RINGERS IV SOLN
INTRAVENOUS | Status: DC | PRN
  Administered 2017-08-30 (×2): via INTRAVENOUS

## 2017-08-30 MED ORDER — ROCURONIUM BROMIDE 10 MG/ML (PF) SYRINGE
PREFILLED_SYRINGE | INTRAVENOUS | Status: DC | PRN
  Administered 2017-08-30: 10 mg via INTRAVENOUS
  Administered 2017-08-30: 40 mg via INTRAVENOUS

## 2017-08-30 MED ORDER — CIPROFLOXACIN IN D5W 400 MG/200ML IV SOLN
400.0000 mg | Freq: Once | INTRAVENOUS | Status: AC
  Administered 2017-08-30: 400 mg via INTRAVENOUS

## 2017-08-30 MED ORDER — INDOMETHACIN 50 MG RE SUPP: 100.0000 mg | Freq: Once | RECTAL | Status: DC

## 2017-08-30 MED ORDER — LIDOCAINE 2% (20 MG/ML) 5 ML SYRINGE
INTRAMUSCULAR | Status: DC | PRN
  Administered 2017-08-30: 60 mg via INTRAVENOUS

## 2017-08-30 MED ORDER — PHENYLEPHRINE 40 MCG/ML (10ML) SYRINGE FOR IV PUSH (FOR BLOOD PRESSURE SUPPORT)
PREFILLED_SYRINGE | INTRAVENOUS | Status: AC
Start: 2017-08-30 — End: ?
  Filled 2017-08-30: qty 10

## 2017-08-30 MED ORDER — INDOMETHACIN 50 MG RE SUPP
RECTAL | Status: AC
  Filled 2017-08-30: qty 2

## 2017-08-30 MED ORDER — SUGAMMADEX SODIUM 200 MG/2ML IV SOLN
INTRAVENOUS | Status: AC
Start: 2017-08-30 — End: ?
  Filled 2017-08-30: qty 2

## 2017-08-30 MED ORDER — CIPROFLOXACIN IN D5W 400 MG/200ML IV SOLN
INTRAVENOUS | Status: AC
  Filled 2017-08-30: qty 200

## 2017-08-30 MED ORDER — IOPAMIDOL (ISOVUE-300) INJECTION 61%
INTRAVENOUS | Status: DC | PRN
  Administered 2017-08-30: 10 mL

## 2017-08-30 MED ORDER — SUGAMMADEX SODIUM 200 MG/2ML IV SOLN
INTRAVENOUS | Status: DC | PRN
  Administered 2017-08-30: 150 mg via INTRAVENOUS

## 2017-08-30 MED ORDER — GLUCAGON HCL RDNA (DIAGNOSTIC) 1 MG IJ SOLR
INTRAMUSCULAR | Status: AC
  Filled 2017-08-30: qty 1

## 2017-08-30 MED ORDER — DEXAMETHASONE SODIUM PHOSPHATE 10 MG/ML IJ SOLN
INTRAMUSCULAR | Status: AC
  Filled 2017-08-30: qty 1

## 2017-08-30 MED ORDER — DEXAMETHASONE SODIUM PHOSPHATE 4 MG/ML IJ SOLN
INTRAMUSCULAR | Status: DC | PRN
  Administered 2017-08-30: 5 mg via INTRAVENOUS

## 2017-08-30 MED ORDER — ONDANSETRON HCL 4 MG/2ML IJ SOLN
4.0000 mg | Freq: Once | INTRAMUSCULAR | Status: AC | PRN
  Administered 2017-08-30: 4 mg via INTRAVENOUS

## 2017-08-30 MED ORDER — ROCURONIUM BROMIDE 50 MG/5ML IV SOSY
PREFILLED_SYRINGE | INTRAVENOUS | Status: AC
  Filled 2017-08-30: qty 5

## 2017-08-30 MED ORDER — SODIUM CHLORIDE 0.9 % IV SOLN: INTRAVENOUS | Status: DC

## 2017-08-30 MED ORDER — FENTANYL CITRATE (PF) 100 MCG/2ML IJ SOLN
INTRAMUSCULAR | Status: AC
  Filled 2017-08-30: qty 2

## 2017-08-30 MED ORDER — INDOMETHACIN 50 MG RE SUPP
RECTAL | Status: DC | PRN
  Administered 2017-08-30: 100 mg via RECTAL

## 2017-08-30 MED ORDER — GLUCAGON HCL RDNA (DIAGNOSTIC) 1 MG IJ SOLR
INTRAMUSCULAR | Status: DC | PRN
  Administered 2017-08-30 (×3): 0.25 mg via INTRAVENOUS

## 2017-08-30 MED ORDER — PHENYLEPHRINE 40 MCG/ML (10ML) SYRINGE FOR IV PUSH (FOR BLOOD PRESSURE SUPPORT)
PREFILLED_SYRINGE | INTRAVENOUS | Status: DC | PRN
  Administered 2017-08-30 (×3): 80 ug via INTRAVENOUS

## 2017-08-30 NOTE — Discharge Instructions (Signed)
YOU HAD AN ENDOSCOPIC PROCEDURE TODAY: Refer to the procedure report and other information in the discharge instructions given to you for any specific questions about what was found during the examination. If this information does not answer your questions, please call [place] office at [phone] to clarify.  ° °YOU SHOULD EXPECT: Some feelings of bloating in the abdomen. Passage of more gas than usual. Walking can help get rid of the air that was put into your GI tract during the procedure and reduce the bloating. If you had a lower endoscopy (such as a colonoscopy or flexible sigmoidoscopy) you may notice spotting of blood in your stool or on the toilet paper. Some abdominal soreness may be present for a day or two, also. ° °DIET: Your first meal following the procedure should be a light meal and then it is ok to progress to your normal diet. A half-sandwich or bowl of soup is an example of a good first meal. Heavy or fried foods are harder to digest and may make you feel nauseous or bloated. Drink plenty of fluids but you should avoid alcoholic beverages for 24 hours. If you had a esophageal dilation, please see attached instructions for diet.   ° °ACTIVITY: Your care partner should take you home directly after the procedure. You should plan to take it easy, moving slowly for the rest of the day. You can resume normal activity the day after the procedure however YOU SHOULD NOT DRIVE, use power tools, machinery or perform tasks that involve climbing or major physical exertion for 24 hours (because of the sedation medicines used during the test).  ° °SYMPTOMS TO REPORT IMMEDIATELY: °A gastroenterologist can be reached at any hour. Please call [phone]  for any of the following symptoms:  °Following lower endoscopy (colonoscopy, flexible sigmoidoscopy) °Excessive amounts of blood in the stool  °Significant tenderness, worsening of abdominal pains  °Swelling of the abdomen that is new, acute  °Fever of 100° or  higher  °Following upper endoscopy (EGD, EUS, ERCP, esophageal dilation) °Vomiting of blood or coffee ground material  °New, significant abdominal pain  °New, significant chest pain or pain under the shoulder blades  °Painful or persistently difficult swallowing  °New shortness of breath  °Black, tarry-looking or red, bloody stools ° °FOLLOW UP:  °If any biopsies were taken you will be contacted by phone or by letter within the next 1-3 weeks. Call [phone]  if you have not heard about the biopsies in 3 weeks.  °Please also call with any specific questions about appointments or follow up tests. ° °

## 2017-08-30 NOTE — Anesthesia Procedure Notes (Signed)
Procedure Name: Intubation Date/Time: 08/30/2017 10:02 AM Performed by: Claudia Desanctis, CRNA Pre-anesthesia Checklist: Patient identified, Emergency Drugs available, Suction available and Patient being monitored Patient Re-evaluated:Patient Re-evaluated prior to induction Oxygen Delivery Method: Circle system utilized Preoxygenation: Pre-oxygenation with 100% oxygen Induction Type: IV induction Ventilation: Mask ventilation without difficulty Laryngoscope Size: 2 and Miller Grade View: Grade II Tube type: Oral Tube size: 7.5 mm Number of attempts: 1 Airway Equipment and Method: Stylet Placement Confirmation: ETT inserted through vocal cords under direct vision,  positive ETCO2 and breath sounds checked- equal and bilateral Secured at: 22 cm Tube secured with: Tape Dental Injury: Teeth and Oropharynx as per pre-operative assessment

## 2017-08-30 NOTE — Op Note (Signed)
[facility] Patient Name: Peter Chandler Procedure Date: 08/30/2017 MRN: [id number] Attending MD: Ladene Artist , MD Date of Birth: 06-13-1941 CSN: [id number] Age: 76 Admit Type: Inpatient Procedure:                ERCP Indications:              Bile leak Providers:                Pricilla Riffle. Fuller Plan, MD, Cleda Daub, RN, William Dalton, Technician Referring MD:             Michael Boston MD, MD Medicines:                General Anesthesia Complications:            No immediate complications. Estimated Blood Loss:     Estimated blood loss: none. Procedure:                Pre-Anesthesia Assessment:                           - Prior to the procedure, a History and Physical                            was performed, and patient medications and                            allergies were reviewed. The patient's tolerance of                            previous anesthesia was also reviewed. The risks                            and benefits of the procedure and the sedation                            options and risks were discussed with the patient.                            All questions were answered, and informed consent                            was obtained. Prior Anticoagulants: The patient has                            taken no previous anticoagulant or antiplatelet                            agents. ASA Grade Assessment: II - A patient with                            mild systemic disease. After reviewing the risks  and benefits, the patient was deemed in                            satisfactory condition to undergo the procedure.                           After obtaining informed consent, the scope was                            passed under direct vision. Throughout the                            procedure, the patient's blood pressure, pulse, and                            oxygen saturations were monitored  continuously. The                            [id number] scope was introduced through the                            mouth, and used to inject contrast into and used to                            inject contrast into the bile duct and ventral                            pancreatic duct. The ERCP was accomplished without                            difficulty. The patient tolerated the procedure                            well. Scope In: Scope Out: Findings:      A scout film of the abdomen was obtained. Surgical clips, consistent       with a previous cholecystectomy and a pigtail external drain were seen       in the area of the right upper quadrant of the abdomen. The esophagus       was successfully intubated under direct vision. The scope was advanced       to a normal major papilla in the descending duodenum without detailed       examination of the pharynx, larynx and associated structures, and upper       GI tract. A periampullary diverticulum at the proximal aspect of the       ampulla was noted. The upper GI tract was otherwise grossly normal.       Superficial cannulation of and contrast injection into the bile duct and       pancreatic duct was accomplished with the short-nosed traction       sphincterotome. The partial pancreatogram appeared normal. The distal       CBD appeared normal except for a tortuous distal CBD likely related to       the diverticulum. Both ducts filled with injection so did not attempt a  complete cholangiogram to avoid overfilling the PD. Despite multiple       attempts with 2 traction sphincterotomes using 0.035 and 0.025       Roadrunner straight guidewires and repositing the patient in partial       left lateral position and prone position I was unable to deeply       cannulate either duct with the guidewires or sphincterotomes, I       personally interpreted the bile duct images. Ductal flow of contrast was       adequate. Impression:                - Normal partial pancreatogram                           - Tortuous distal CBD, incomplete cholangiogram                            however otherwise appeared normal                           - Periampullary diverticulum                           - Unable to deeply cannulate or pass a guidewire in                            CBD so unable to place a biliary stent. Moderate Sedation:      N/A- Per Anesthesia Care Recommendation:           - Patient has a contact number available for                            emergencies. The signs and symptoms of potential                            delayed complications were discussed with the                            patient. Return to normal activities tomorrow.                            Written discharge instructions were provided to the                            patient.                           - Clear liquid diet for 2-3 hours, then advance as                            tolerated to soft diet today.                           - Refer to a tertiary biliarly endoscopy center as  soon as possible for attempt at ERCP/biliary stent                            placement.                           - Continue current antibiotics.                           - Further GI follow up as needed with Dr. Carlean Purl. Procedure Code(s):        --- Professional ---                           [phone], Endoscopic retrograde                            cholangiopancreatography (ERCP); diagnostic,                            including collection of specimen(s) by brushing or                            washing, when performed (separate procedure) Diagnosis Code(s):        --- Professional ---                           K83.8, Other specified diseases of biliary tract CPT copyright 2016 American Medical Association. All rights reserved. The codes documented in this report are preliminary and upon coder review may  be revised to meet current  compliance requirements. Ladene Artist, MD 08/30/2017 11:29:01 AM This report has been signed electronically. Number of Addenda: 0

## 2017-08-30 NOTE — Anesthesia Preprocedure Evaluation (Addendum)
Anesthesia Evaluation  Patient identified by MRN, date of birth, ID band Patient awake    Reviewed: Allergy & Precautions, NPO status , Patient's Chart, lab work & pertinent test results  History of Anesthesia Complications (+) history of anesthetic complications ("slow to wake up")  Airway Mallampati: II  TM Distance: <3 FB Neck ROM: Full    Dental  (+) Teeth Intact, Dental Advisory Given   Pulmonary neg pulmonary ROS,    Pulmonary exam normal breath sounds clear to auscultation       Cardiovascular Exercise Tolerance: Good negative cardio ROS Normal cardiovascular exam Rhythm:Regular Rate:Normal     Neuro/Psych negative neurological ROS  negative psych ROS   GI/Hepatic negative GI ROS, Neg liver ROS, Cholelithiasis    Endo/Other  negative endocrine ROS  Renal/GU negative Renal ROS   Hematuria     Musculoskeletal  (+) Arthritis , Osteoarthritis,    Abdominal   Peds  Hematology  (+) Blood dyscrasia, anemia , Chronic thrombocytopenic purpura    Anesthesia Other Findings Day of surgery medications reviewed with the patient.  Reproductive/Obstetrics                            Anesthesia Physical Anesthesia Plan  ASA: II  Anesthesia Plan: General   Post-op Pain Management:    Induction: Intravenous  PONV Risk Score and Plan: 2 and Dexamethasone and Ondansetron  Airway Management Planned: Oral ETT  Additional Equipment:   Intra-op Plan:   Post-operative Plan: Extubation in OR  Informed Consent: I have reviewed the patients History and Physical, chart, labs and discussed the procedure including the risks, benefits and alternatives for the proposed anesthesia with the patient or authorized representative who has indicated his/her understanding and acceptance.   Dental advisory given  Plan Discussed with: CRNA  Anesthesia Plan Comments: (Risks/benefits of general  anesthesia discussed with patient including risk of damage to teeth, lips, gum, and tongue, nausea/vomiting, allergic reactions to medications, and the possibility of heart attack, stroke and death.  All patient questions answered.  Patient wishes to proceed.)        Anesthesia Quick Evaluation

## 2017-08-30 NOTE — Anesthesia Postprocedure Evaluation (Signed)
Anesthesia Post Note  Patient: Gillermina Phy  Procedure(s) Performed: ENDOSCOPIC RETROGRADE CHOLANGIOPANCREATOGRAPHY (ERCP) WITH PROPOFOL (N/A )     Patient location during evaluation: Endoscopy Anesthesia Type: General Level of consciousness: awake and alert, awake and oriented Pain management: pain level controlled Vital Signs Assessment: post-procedure vital signs reviewed and stable Respiratory status: spontaneous breathing, nonlabored ventilation and respiratory function stable Cardiovascular status: stable and blood pressure returned to baseline Postop Assessment: no apparent nausea or vomiting Anesthetic complications: no    Last Vitals:  Vitals:   08/30/17 1155 08/30/17 1200  BP:  (!) 164/76  Pulse:  76  Resp:  (!) 25  Temp:    SpO2: 94% 93%    Last Pain:  Vitals:   08/30/17 1125  TempSrc: Oral                 Catalina Gravel

## 2017-08-30 NOTE — Transfer of Care (Signed)
Immediate Anesthesia Transfer of Care Note  Patient: Peter Chandler  Procedure(s) Performed: ENDOSCOPIC RETROGRADE CHOLANGIOPANCREATOGRAPHY (ERCP) WITH PROPOFOL (N/A ) BILIARY STENT PLACEMENT (N/A )  Patient Location: PACU and Endoscopy Unit  Anesthesia Type:General  Level of Consciousness: drowsy  Airway & Oxygen Therapy: Patient Spontanous Breathing and Patient connected to face mask  Post-op Assessment: Report given to RN and Post -op Vital signs reviewed and stable  Post vital signs: Reviewed and stable  Last Vitals:  Vitals:   08/30/17 0906  BP: (!) 146/74  Pulse: (!) 103  Resp: 13  Temp: 37.3 C  SpO2: 95%    Last Pain:  Vitals:   08/30/17 0906  TempSrc: Oral         Complications: No apparent anesthesia complications

## 2017-08-30 NOTE — H&P (Signed)
History of Present Illness Peter Hector MD; 08/29/2017 12:18 PM)  The patient is a 76 year old male presenting status-post cholecystectomy. Note for "Post-Op Cholecystectomy": `  `  `  The patient returns s/p laparoscopic cholecystectomy for acute on chronic cholecystitis.   Date of procedure: 08/10/2017   Pathology: Chronic cholecystitis   76 year old male returns after surgery, slowly improvng. He had intermittent fevers and fatigue. CT scan revealed a large fluid GB fossa collection suspicious of an abscess. Underwent percutaneous drainage. Placed on oral Cipro and Flagyl given his allergy/rash to Augmentin. Drain was placed. Cultures came back with pansensitive Escherichia coli.    He comes in today with his wife. He is feeling better. Nausea is last. Reglan is help. Appetite coming back. Fevers have gone down. Drainage initially was like old blood and rusty brown. However about 2 days ago it changed. Now it is more green the past day. Was using Vicodin. That seemed to help. Held off on tramadol. Using intermittent Tylenol for some occasional soreness. Urinating fine. Moving his bowels fine. No diarrhea.   Gram Stain ABUNDANT WBC PRESENT, PREDOMINANTLY PMN MODERATE GRAM NEGATIVE RODS Performed at [facility] [address] [place], [place] [address]   Culture MODERATE ESCHERICHIA COLI NO ANAEROBES ISOLATED; CULTURE IN PROGRESS FOR 5 DAYS   Report Status PENDING  Organism ID, Bacteria ESCHERICHIA COLI   Susceptibility Escherichia coli MIC AMPICILLIN 8 SENSITIVE Sensitive AMPICILLIN/SULBACTAM 4 SENSITIVE Sensitive CEFAZOLIN <=4 SENSITIVE Sensitive CEFEPIME <=1 SENSITIVE Sensitive CEFTAZIDIME <=1 SENSITIVE Sensitive CEFTRIAXONE <=1 SENSITIVE Sensitive    CIPROFLOXACIN <=0.25 SENS... Sensitive    Extended ESBL NEGATIVE Sensitive GENTAMICIN <=1 SENSITIVE Sensitive IMIPENEM <=0.25 SENS... Sensitive PIP/TAZO  <=4 SENSITIVE Sensitive TRIMETH/SULFA <=20 SENSIT... Sensitive     Problem List/Past Medical Peter Hector, MD; 08/29/2017 9:38 AM)  CALCULUS OF GALLBLADDER WITHOUT CHOLECYSTITIS WITHOUT OBSTRUCTION (K80.20)   CHRONIC RIGHT-SIDED LOW BACK PAIN WITHOUT SCIATICA (M54.5)   HEMATURIA, GROSS (R31.0)   CHRONIC CHOLECYSTITIS WITH CALCULUS (K80.10)   FEVER AND CHILLS (R50.9)   FATIGUE, UNSPECIFIED TYPE (R53.83)   RASH (R21)   POSTOPERATIVE ABSCESS (T81.4XXA)    Past Surgical History Peter Hector, MD; 08/29/2017 9:38 AM)  No pertinent past surgical history    Diagnostic Studies History Peter Hector, MD; 08/29/2017 9:38 AM)  Colonoscopy  1-5 years ago   Allergies Malachy Moan, RMA; 08/29/2017 9:23 AM)  PredniSONE *CORTICOSTEROIDS*  Hives.  Penicillin V *PENICILLINS*  Anaphylaxis.   Medication History Malachy Moan, [place]; 08/29/2017 9:23 AM)  Metoclopramide HCl (10MG  Tablet, Oral) Active.  No Current Medications (Taken starting 08/29/2017)  Hydrocodone-Acetaminophen (5-325MG  Tablet, Oral) Active.  Cipro (500MG  Tablet, 1 (one) Tablet Oral two times daily, Taken starting 08/23/2017) Active.  Flagyl (500MG  Tablet, 1 (one) Tablet Oral two times daily, Taken starting 08/23/2017) Active.  Tylenol (500MG  Capsule, Oral) Active.  Medications Reconciled   Social History Peter Hector, MD; 08/29/2017 9:38 AM)  Caffeine use  Carbonated beverages, Coffee.  No alcohol use   No drug use   Tobacco use  Never smoker.   Family History Peter Hector, MD; 08/29/2017 9:38 AM)  Heart Disease  Father.  Heart disease in male family member before age 37   Melanoma  Family Members In [place]  Mother.   Other Problems Peter Hector, MD; 08/29/2017 9:38 AM)  Enlarged Prostate   Hypercholesterolemia    Vitals Malachy Moan RMA; 08/29/2017 9:24 AM)  08/29/2017 9:23 AM  Weight: 168 lb  Height: 69in  Body Surface Area:  1.92 m Body Mass Index: 24.81 kg/m   Temp.: 98.70F  Pulse: 105 (Regular)   BP: 132/78 (Sitting, Left Arm, Standard)        Physical Exam Peter Hector MD; 08/29/2017 10:13 AM)  General  Mental Status-Alert.  General Appearance-Not in acute distress.  Voice-Normal.  Note: Not sickly. Not toxic. More alert. Less tired.    Integumentary  Global Assessment  Normal Exam - Distribution of scalp and body hair is normal.  General Characteristics  Overall Skin Surface - no rashes and no suspicious lesions.  Note: Mild maculopapular rash on lower back. Nearly resolved.    Head and Neck  Head-normocephalic, atraumatic with no lesions or palpable masses.  Face  Global Assessment - atraumatic, no absence of expression.  Neck  Global Assessment - no abnormal movements, no decreased range of motion.  Trachea-midline.  Thyroid  Gland Characteristics - non-tender.   Eye  Eyeball - Left-Extraocular movements intact, No Nystagmus.  Eyeball - Right-Extraocular movements intact, No Nystagmus.  Upper Eyelid - Left-No Cyanotic.  Upper Eyelid - Right-No Cyanotic.   Chest and Lung Exam  Inspection  Accessory muscles - No use of accessory muscles in breathing.   Abdomen  Note: Incision with normal healing ridge at bellybutton. No active bleeding. No cellulitis. No guarding/rebound tenderness. Nondistended. No abdominal discomfort.  Right upper quadrant percutaneous drain with thick green drainage suspicious for bile. Drain site and dressing clean. Minimal discomfort there only.    Peripheral Vascular  Upper Extremity  Inspection - Left - Not Gangrenous, No Petechiae. Right - Not Gangrenous, No Petechiae.   Neurologic  Neurologic evaluation reveals -normal attention span and ability to concentrate, able to name objects and repeat phrases. Appropriate fund of knowledge and normal coordination.   Neuropsychiatric  Mental status exam performed  with findings of-able to articulate well with normal speech/language, rate, volume and coherence and no evidence of hallucinations, delusions, obsessions or homicidal/suicidal ideation.  Orientation-oriented X3.   Musculoskeletal  Global Assessment  Gait and Station - normal gait and station.   Lymphatic  General Lymphatics  Description - No Generalized lymphadenopathy.     Assessment & Plan  ACUTE CHOLECYSTITIS WITH CHRONIC CHOLECYSTITIS (K81.2)  Impression: Slowly recovering status post cholecystectomy for acute on chronic cholecystitis complicated with infected hematoma now delayed bile leak. Patient and especially wife feeling overwhelmed but mostly reassured.   Went over in detail with the plan. Patient needs internal drainage with an ERCP and stent. Complete antibiotics. 10 days only.   Continue drain.   Renewed Reglan for nausea.   Control pain with ice and Tylenol. Use tramadol instead of the hydrocodone. Refill if needed. He is only needing intermittently, which is hopefully a good sign.   Patient was instructed to follow-up in one week.  Started Reglan 5MG , 1-2 Tablet four times daily, as needed, #20, 08/29/2017, Ref. x2.  Local Order: As needed for NAUSEA  POSTOPERATIVE ABSCESS (T81.4XXA)  Impression: Recovering with Escherichia coli sensitive infected hematoma postoperative abscess.   Now the drainage looks bilious. Suspect delayed bile leak.   Needs urgent ERCP and stenting.Patient is feeling better and eating better, so would like to do this as an outpatient.    RASH (R21)  Impression: Rash of uncertain etiology. Does not look like shingles. ?Augmentin? Resolving

## 2017-08-30 NOTE — Telephone Encounter (Signed)
Attempted to reach the patient.  No answer on the home phone I left a message on the cell phone. Urgent STAT referral has been faxed to [facility].  I spoke with Sharl Ma and they are going to work on an expedited referral.

## 2017-08-31 DIAGNOSIS — K859 Acute pancreatitis without necrosis or infection, unspecified: Secondary | ICD-10-CM | POA: Diagnosis not present

## 2017-08-31 DIAGNOSIS — K838 Other specified diseases of biliary tract: Secondary | ICD-10-CM | POA: Diagnosis not present

## 2017-08-31 NOTE — Telephone Encounter (Signed)
Patient is scheduled for today at 1:30 with [facility]

## 2017-09-01 ENCOUNTER — Encounter (HOSPITAL_COMMUNITY): Payer: Self-pay | Admitting: Gastroenterology

## 2017-09-01 DIAGNOSIS — Z792 Long term (current) use of antibiotics: Secondary | ICD-10-CM | POA: Diagnosis not present

## 2017-09-01 DIAGNOSIS — K838 Other specified diseases of biliary tract: Secondary | ICD-10-CM | POA: Diagnosis not present

## 2017-09-01 DIAGNOSIS — Z8249 Family history of ischemic heart disease and other diseases of the circulatory system: Secondary | ICD-10-CM | POA: Diagnosis not present

## 2017-09-01 DIAGNOSIS — J9811 Atelectasis: Secondary | ICD-10-CM | POA: Diagnosis not present

## 2017-09-01 DIAGNOSIS — Z888 Allergy status to other drugs, medicaments and biological substances status: Secondary | ICD-10-CM | POA: Diagnosis not present

## 2017-09-01 DIAGNOSIS — D72829 Elevated white blood cell count, unspecified: Secondary | ICD-10-CM | POA: Diagnosis not present

## 2017-09-01 DIAGNOSIS — K859 Acute pancreatitis without necrosis or infection, unspecified: Secondary | ICD-10-CM | POA: Diagnosis present

## 2017-09-01 DIAGNOSIS — Z48815 Encounter for surgical aftercare following surgery on the digestive system: Secondary | ICD-10-CM | POA: Diagnosis not present

## 2017-09-01 DIAGNOSIS — K810 Acute cholecystitis: Secondary | ICD-10-CM | POA: Diagnosis not present

## 2017-09-01 DIAGNOSIS — K5900 Constipation, unspecified: Secondary | ICD-10-CM | POA: Diagnosis not present

## 2017-09-01 DIAGNOSIS — Z9689 Presence of other specified functional implants: Secondary | ICD-10-CM | POA: Diagnosis not present

## 2017-09-01 DIAGNOSIS — T8143XA Infection following a procedure, organ and space surgical site, initial encounter: Secondary | ICD-10-CM | POA: Diagnosis present

## 2017-09-01 DIAGNOSIS — Z9049 Acquired absence of other specified parts of digestive tract: Secondary | ICD-10-CM | POA: Diagnosis not present

## 2017-09-01 DIAGNOSIS — I493 Ventricular premature depolarization: Secondary | ICD-10-CM | POA: Diagnosis present

## 2017-09-01 DIAGNOSIS — K839 Disease of biliary tract, unspecified: Secondary | ICD-10-CM | POA: Diagnosis not present

## 2017-09-01 DIAGNOSIS — J90 Pleural effusion, not elsewhere classified: Secondary | ICD-10-CM | POA: Diagnosis not present

## 2017-09-01 DIAGNOSIS — K858 Other acute pancreatitis without necrosis or infection: Secondary | ICD-10-CM | POA: Diagnosis not present

## 2017-09-01 DIAGNOSIS — E7800 Pure hypercholesterolemia, unspecified: Secondary | ICD-10-CM | POA: Diagnosis present

## 2017-09-01 DIAGNOSIS — R5381 Other malaise: Secondary | ICD-10-CM | POA: Diagnosis not present

## 2017-09-01 DIAGNOSIS — K651 Peritoneal abscess: Secondary | ICD-10-CM | POA: Diagnosis present

## 2017-09-01 DIAGNOSIS — K9189 Other postprocedural complications and disorders of digestive system: Secondary | ICD-10-CM | POA: Diagnosis present

## 2017-09-01 DIAGNOSIS — B9620 Unspecified Escherichia coli [E. coli] as the cause of diseases classified elsewhere: Secondary | ICD-10-CM | POA: Diagnosis present

## 2017-09-01 DIAGNOSIS — Z880 Allergy status to penicillin: Secondary | ICD-10-CM | POA: Diagnosis not present

## 2017-09-01 DIAGNOSIS — E785 Hyperlipidemia, unspecified: Secondary | ICD-10-CM | POA: Diagnosis present

## 2017-09-02 MED ORDER — KETOROLAC TROMETHAMINE 15 MG/ML IJ SOLN
15.00 mg | INTRAMUSCULAR | Status: DC
Start: ? — End: 2017-09-02

## 2017-09-02 MED ORDER — ENOXAPARIN SODIUM 40 MG/0.4ML ~~LOC~~ SOLN
40.00 mg | SUBCUTANEOUS | Status: DC
Start: 2017-09-03 — End: 2017-09-02

## 2017-09-02 MED ORDER — METRONIDAZOLE IN NACL 5-0.79 MG/ML-% IV SOLN
500.00 mg | INTRAVENOUS | Status: DC
Start: 2017-09-02 — End: 2017-09-02

## 2017-09-02 MED ORDER — OXYCODONE HCL 5 MG PO TABS
5.00 mg | ORAL_TABLET | ORAL | Status: DC
Start: ? — End: 2017-09-02

## 2017-09-02 MED ORDER — CIPROFLOXACIN IN D5W 400 MG/200ML IV SOLN
400.00 mg | INTRAVENOUS | Status: DC
Start: 2017-09-02 — End: 2017-09-02

## 2017-09-02 MED ORDER — ONDANSETRON 4 MG PO TBDP
4.00 mg | ORAL_TABLET | ORAL | Status: DC
Start: ? — End: 2017-09-02

## 2017-09-02 MED ORDER — PHENOL 1.4 % MT LIQD
1.00 | OROMUCOSAL | Status: DC
Start: ? — End: 2017-09-02

## 2017-09-02 MED ORDER — ACETAMINOPHEN 325 MG PO TABS
650.00 mg | ORAL_TABLET | ORAL | Status: DC
Start: 2017-09-02 — End: 2017-09-02

## 2017-09-02 MED ORDER — LACTATED RINGERS IV SOLN
INTRAVENOUS | Status: DC
Start: ? — End: 2017-09-02

## 2017-09-02 MED ORDER — GENERIC EXTERNAL MEDICATION
7.50 mg | Status: DC
Start: ? — End: 2017-09-02

## 2017-09-06 ENCOUNTER — Telehealth: Payer: Self-pay | Admitting: Family Medicine

## 2017-09-06 DIAGNOSIS — K832 Perforation of bile duct: Secondary | ICD-10-CM | POA: Diagnosis not present

## 2017-09-06 DIAGNOSIS — R2689 Other abnormalities of gait and mobility: Secondary | ICD-10-CM | POA: Diagnosis not present

## 2017-09-06 DIAGNOSIS — E785 Hyperlipidemia, unspecified: Secondary | ICD-10-CM | POA: Diagnosis not present

## 2017-09-06 DIAGNOSIS — K9189 Other postprocedural complications and disorders of digestive system: Secondary | ICD-10-CM | POA: Diagnosis not present

## 2017-09-06 NOTE — Telephone Encounter (Signed)
Copied from [place]. Topic: General - Other >> Sep 06, 2017  3:37 PM Vernona Rieger wrote: Tharon Aquas is calling from [facility] stating that she needs verbal orders to continue seeing patient for 2 times a week for 4 weeks. (ambulation, balance) call back is [phone]

## 2017-09-07 NOTE — Telephone Encounter (Signed)
Sent to PCP ?

## 2017-09-07 NOTE — Telephone Encounter (Signed)
Called Frankie and lefta VM with pt's name and DOB giving them the OK for verbal orders requested.

## 2017-09-07 NOTE — Telephone Encounter (Signed)
Please call this in  

## 2017-09-08 ENCOUNTER — Encounter: Payer: Self-pay | Admitting: Radiology

## 2017-09-08 ENCOUNTER — Ambulatory Visit
Admission: RE | Admit: 2017-09-08 | Discharge: 2017-09-08 | Disposition: A | Discharge status: Home / Self Care | Payer: Medicare Other | Source: Ambulatory Visit | Attending: Surgery | Admitting: Surgery

## 2017-09-08 ENCOUNTER — Ambulatory Visit
Admission: RE | Admit: 2017-09-08 | Discharge: 2017-09-08 | Disposition: A | Discharge status: Home / Self Care | Payer: Medicare Other | Source: Ambulatory Visit | Attending: Radiology | Admitting: Radiology

## 2017-09-08 ENCOUNTER — Other Ambulatory Visit: Payer: Self-pay | Admitting: Surgery

## 2017-09-08 DIAGNOSIS — R188 Other ascites: Secondary | ICD-10-CM

## 2017-09-08 DIAGNOSIS — K859 Acute pancreatitis without necrosis or infection, unspecified: Secondary | ICD-10-CM | POA: Diagnosis not present

## 2017-09-08 DIAGNOSIS — Z4689 Encounter for fitting and adjustment of other specified devices: Secondary | ICD-10-CM | POA: Diagnosis not present

## 2017-09-08 DIAGNOSIS — K838 Other specified diseases of biliary tract: Secondary | ICD-10-CM | POA: Diagnosis not present

## 2017-09-08 DIAGNOSIS — K833 Fistula of bile duct: Secondary | ICD-10-CM | POA: Diagnosis not present

## 2017-09-08 HISTORY — PX: IR RADIOLOGIST EVAL & MGMT: IMG5224

## 2017-09-08 MED ORDER — IOPAMIDOL (ISOVUE-300) INJECTION 61%
100.0000 mL | Freq: Once | INTRAVENOUS | Status: AC | PRN
Start: 2017-09-08 — End: 2017-09-08
  Administered 2017-09-08: 100 mL via INTRAVENOUS

## 2017-09-08 NOTE — Progress Notes (Signed)
Referring Physician(s): Peter Peter Chandler  Chief Complaint: The patient is seen in follow up today s/p 08/24/17 Successful CT guided placement of a 10 French all purpose drain catheter into the gallbladder fossa abscess    History of present illness:  History of cholecystectomy, history of laparoscopic cholecystectomy on 08/10/2017 with findings of chronic cholecystitis/cholelithiasis. Recurrent right upper quadrant abdominal pain, fever and chills. CT scan performed of the abdomen and pelvis on 07/23/2017 demonstrates a large air and fluid collection within the gallbladder fossa worrisome for an abscess. Drain placed 11/21  Since placement pt has had biliary stent and pancreatic stent placed at [facility] (Biliary stent had been attempted by Peter Chandler at [facility] 1 week ago-- unsuccessful; sent to [facility] for evaluation and stent placement--- he was in [place] for 5 days.)  Returns to [facility] for evaluation of GB fossa abscess Feeling well Denies pain; denies fever/chills OP of drain = flushes daily 5 cc sterile saline daily; OP color is brownish; bilious. CT today show resolution of abscess per Peter Peter Chandler injection with 10- 15 cc contrast shows bile duct leak remains  Past Medical History:  Diagnosis Date  . Arthritis    "hands" (05/13/2016)  . Basal cell carcinoma    "face, back"  . Bleeding per rectum 05/13/2016  . BPH (benign prostatic hyperplasia) 09/2015  . Cholelithiasis 09/2015   noted on CT done by urology for his gross hematuria w/u--pt was referred to gen surg by Peter. Ralene Chandler office  . Chronic thrombocytopenic purpura (Peter Chandler)    Peter Chandler 05/13/2016  . Complication of anesthesia    slow to awaken after anesthesia  . Diplopia 2017   left eye.  glasses lens fitted with a prism  . Gross hematuria 09/2015   Painless.  cystoscopy ~ 09/2015, Peter Peter Chandler.  Conclusion (per pt) is source of blood is the prostate.    . Hyperlipidemia     Past Surgical History:    Procedure Laterality Date  . COLONOSCOPY  11/05/2002   Internal hemorrhoids, hyperplastic sigmoid polyp, ascending erythema, descending venous Peter, hypertrophied papilla. Peter. Aurelio Chandler, [place]  . COLONOSCOPY N/A 05/14/2016   per Peter. Carlean Chandler, hypertrophied anal papillae but no polyps, no repeats needed   . CYSTOSCOPY  ~ 09/2015   to eval gross hematuria.  Peter Peter Chandler at [facility] urology.   . ENDOSCOPIC RETROGRADE CHOLANGIOPANCREATOGRAPHY (ERCP) WITH PROPOFOL N/A 08/30/2017   Procedure: ENDOSCOPIC RETROGRADE CHOLANGIOPANCREATOGRAPHY (ERCP) WITH PROPOFOL;  Surgeon: Peter Artist, MD;  Location: [facility] ENDOSCOPY;  Service: Endoscopy;  Laterality: N/A;  . IR RADIOLOGIST EVAL & MGMT  09/08/2017  . LAPAROSCOPIC CHOLECYSTECTOMY SINGLE SITE WITH INTRAOPERATIVE CHOLANGIOGRAM N/A 08/10/2017   Procedure: LAPAROSCOPIC CHOLECYSTECTOMY SINGLE SITE WITH INTRAOPERATIVE CHOLANGIOGRAM;  Surgeon: Peter Boston, MD;  Location: [facility] ORS;  Service: General;  Laterality: N/A;  . right site perc tube  08/24/2017    Allergies: Penicillins and Prednisone  Medications: Prior to Admission medications   Medication Sig Start Date End Date Taking? Authorizing Provider  acetaminophen (TYLENOL) 500 MG tablet Take 500-1,000 mg by mouth every 6 (six) hours as needed for mild pain, fever or headache.     [provider]  ciprofloxacin (CIPRO) 500 MG tablet Take 500 mg by mouth 2 (two) times daily. Started 11/21 for 10 days    [provider]  HYDROcodone-acetaminophen (NORCO/VICODIN) 5-325 MG tablet Take 1 tablet by mouth every 4 (four) hours as needed for severe pain. 08/26/17   Peter Dessert, MD  metoCLOPramide (REGLAN) 10  MG tablet Take 1 tablet (10 mg total) by mouth every 6 (six) hours as needed for nausea or vomiting. 08/26/17   Peter Dessert, MD  metroNIDAZOLE (FLAGYL) 500 MG tablet Take 500 mg by mouth 2 (two) times daily. Started 11/21 for 10 days    [provider]  traMADol (ULTRAM) 50 MG  tablet Take 1-2 tablets (50-100 mg total) every 6 (six) hours as needed by mouth for moderate pain or severe pain. 08/10/17   Peter Boston, MD     Family History  Problem Relation Age of Onset  . Ovarian cancer Unknown   . Heart disease Unknown   . Heart disease Father 18       Died age 19    Social History   Socioeconomic History  . Marital status: Married    Spouse name: Not on file  . Number of children: Not on file  . Years of education: Not on file  . Highest education level: Not on file  Social Needs  . Financial resource strain: Not on file  . Food insecurity - worry: Not on file  . Food insecurity - inability: Not on file  . Transportation needs - medical: Not on file  . Transportation needs - non-medical: Not on file  Occupational History  . Not on file  Tobacco Use  . Smoking status: Never Smoker  . Smokeless tobacco: Never Used  Substance and Sexual Activity  . Alcohol use: No    Alcohol/week: 0.0 oz  . Drug use: No  . Sexual activity: Yes  Other Topics Concern  . 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       Vital Signs: BP (!) 158/78   Pulse 85   Temp 98.4 F (36.9 C)   SpO2 98%   Physical Exam  Constitutional: He is oriented to person, place, and time.  Abdominal: Soft. Bowel sounds are normal.  Musculoskeletal: Normal range of motion.  Neurological: He is alert and oriented to person, place, and time.  Skin: Skin is warm and dry.  Site of drain is clean and dry NT no bleeding No sign of infection OP brown/bilious 10 cc in bag (2 days worth of collection)   Psychiatric: He has a normal mood and affect. His behavior is normal.  Nursing note and vitals reviewed.   Imaging: Ir Radiologist Eval & Mgmt  Result Date: 09/08/2017 Please refer to notes tab for details about interventional procedure. (Op Note)   Labs:  CBC: Recent Labs    08/08/17 1019 08/24/17 1125 08/26/17 1230  WBC 5.8 13.7*  10.9*  HGB 13.1 11.4* 12.2*  HCT 39.6 33.3* 35.4*  PLT 144* 390 407*    COAGS: Recent Labs    08/24/17 1125  INR 1.24    BMP: Recent Labs    03/15/17 1023 08/08/17 1019 08/24/17 1125 08/26/17 1230  NA  --  138 127* 131*  K  --  4.7 4.4 3.8  CL  --  104 92* 93*  CO2  --  26 22 26   GLUCOSE  --  97 102* 156*  BUN  --  16 16 14   CALCIUM  --  8.8* 8.7* 8.8*  CREATININE 1.20 1.16 1.04 1.02  GFRNONAA  --  58* >60 >60  GFRAA  --  >60 >60 >60    LIVER FUNCTION TESTS: Recent Labs    08/24/17 1125 08/26/17 1230  BILITOT 1.4* 0.8  AST 32 26  ALT  32 27  ALKPHOS 149* 134*  PROT 7.3 7.7  ALBUMIN 2.9* 2.8*    Assessment:  GB fossa abscess post 11/7 cholecystectomy GB fossa abscess drain placed in IR 11/21. Pt has one day left of antibiotics: Cipro and Flagyl from [facility]ney [facility] To see Peter Johney [place] Tues 12/11 Pt and wife aware that Bile leak still exists Drain will remain for now. Drain injection and recheck in 2 weeks They are agreeable to Chandler  Signed: Auriana Scalia A, PA-C 09/08/2017, 1:48 PM   Please refer to Peter. Anselm Pancoast attestation of this note for management and Chandler.

## 2017-09-09 DIAGNOSIS — E785 Hyperlipidemia, unspecified: Secondary | ICD-10-CM | POA: Diagnosis not present

## 2017-09-09 DIAGNOSIS — K9189 Other postprocedural complications and disorders of digestive system: Secondary | ICD-10-CM | POA: Diagnosis not present

## 2017-09-09 DIAGNOSIS — R2689 Other abnormalities of gait and mobility: Secondary | ICD-10-CM | POA: Diagnosis not present

## 2017-09-09 DIAGNOSIS — K832 Perforation of bile duct: Secondary | ICD-10-CM | POA: Diagnosis not present

## 2017-09-13 ENCOUNTER — Ambulatory Visit: Payer: Medicare Other | Admitting: Family Medicine

## 2017-09-14 DIAGNOSIS — R2689 Other abnormalities of gait and mobility: Secondary | ICD-10-CM | POA: Diagnosis not present

## 2017-09-14 DIAGNOSIS — K9189 Other postprocedural complications and disorders of digestive system: Secondary | ICD-10-CM | POA: Diagnosis not present

## 2017-09-14 DIAGNOSIS — K832 Perforation of bile duct: Secondary | ICD-10-CM | POA: Diagnosis not present

## 2017-09-14 DIAGNOSIS — E785 Hyperlipidemia, unspecified: Secondary | ICD-10-CM | POA: Diagnosis not present

## 2017-09-15 DIAGNOSIS — K838 Other specified diseases of biliary tract: Secondary | ICD-10-CM | POA: Diagnosis not present

## 2017-09-15 DIAGNOSIS — Z4803 Encounter for change or removal of drains: Secondary | ICD-10-CM | POA: Diagnosis not present

## 2017-09-15 DIAGNOSIS — K9189 Other postprocedural complications and disorders of digestive system: Secondary | ICD-10-CM | POA: Diagnosis not present

## 2017-09-15 DIAGNOSIS — Z960 Presence of urogenital implants: Secondary | ICD-10-CM | POA: Diagnosis not present

## 2017-09-16 DIAGNOSIS — R2689 Other abnormalities of gait and mobility: Secondary | ICD-10-CM | POA: Diagnosis not present

## 2017-09-16 DIAGNOSIS — E785 Hyperlipidemia, unspecified: Secondary | ICD-10-CM | POA: Diagnosis not present

## 2017-09-16 DIAGNOSIS — K9189 Other postprocedural complications and disorders of digestive system: Secondary | ICD-10-CM | POA: Diagnosis not present

## 2017-09-16 DIAGNOSIS — K832 Perforation of bile duct: Secondary | ICD-10-CM | POA: Diagnosis not present

## 2017-09-19 ENCOUNTER — Ambulatory Visit (INDEPENDENT_AMBULATORY_CARE_PROVIDER_SITE_OTHER): Payer: Medicare Other | Admitting: Family Medicine

## 2017-09-19 ENCOUNTER — Encounter: Payer: Self-pay | Admitting: Family Medicine

## 2017-09-19 VITALS — BP 104/60 | HR 98 | Temp 98.2°F | Wt 154.8 lb

## 2017-09-19 DIAGNOSIS — K8512 Biliary acute pancreatitis with infected necrosis: Secondary | ICD-10-CM | POA: Diagnosis not present

## 2017-09-19 DIAGNOSIS — K839 Disease of biliary tract, unspecified: Secondary | ICD-10-CM | POA: Diagnosis not present

## 2017-09-19 DIAGNOSIS — K812 Acute cholecystitis with chronic cholecystitis: Secondary | ICD-10-CM

## 2017-09-19 NOTE — Progress Notes (Signed)
   Subjective:    Patient ID: Peter Chandler, male    DOB: 05-May-1941, 76 y.o.   MRN: [id number]  HPI Here to follow up from a hospital stay at [facility] from 08-31-17 to 09-05-17. He had been found to have gallstones and went for a routine laparoscopic cholecystectomy, but afterwards he developed spiking fevers. He was found to have an abscess at the site and a percutaneous drain was placed. This then led to a bout of pancreatitis. He ended up having stents placed in both the pancreatic duct and in the common bile duct. He is scheduled to have the pancreatic duct stent removed tomorrow. The drain is still in place. He feels much better now and is getting his strength back. No abdominal pain or nausea. No fevers. He took his last dose of antibiotics this morning. BMs are regular.    Review of Systems  Constitutional: Negative.   Cardiovascular: Negative.   Gastrointestinal: Negative.   Genitourinary: Negative.        Objective:   Physical Exam  Constitutional: He is oriented to person, place, and time. He appears well-developed and well-nourished. No distress.  Cardiovascular: Normal rate, regular rhythm, normal heart sounds and intact distal pulses.  No murmur heard. Occasional ectopy   Pulmonary/Chest: Effort normal and breath sounds normal. No respiratory distress. He has no wheezes. He has no rales.  Abdominal: Soft. Bowel sounds are normal. He exhibits no distension and no mass. There is no tenderness. There is no rebound and no guarding.  The drain site is clean. The bag contains some yellow brown fluid   Neurological: He is alert and oriented to person, place, and time.          Assessment & Plan:  He is recovering from a recent laparoscopic cholecystectomy complicated by an abscess and by pancreatitis. These have resolved. He will have the pancreatic duct stent removed tomorrow and I suspect the CBD stent will not be removed for another month or two. He will see [place] as  needed.  Alysia Penna, MD

## 2017-09-20 ENCOUNTER — Ambulatory Visit
Admission: RE | Admit: 2017-09-20 | Discharge: 2017-09-20 | Disposition: A | Discharge status: Home / Self Care | Payer: Medicare Other | Source: Ambulatory Visit | Attending: Surgery | Admitting: Surgery

## 2017-09-20 ENCOUNTER — Encounter: Payer: Self-pay | Admitting: Radiology

## 2017-09-20 ENCOUNTER — Other Ambulatory Visit: Payer: Self-pay | Admitting: Surgery

## 2017-09-20 DIAGNOSIS — R188 Other ascites: Secondary | ICD-10-CM | POA: Diagnosis not present

## 2017-09-20 DIAGNOSIS — R2689 Other abnormalities of gait and mobility: Secondary | ICD-10-CM | POA: Diagnosis not present

## 2017-09-20 DIAGNOSIS — K828 Other specified diseases of gallbladder: Secondary | ICD-10-CM | POA: Diagnosis not present

## 2017-09-20 DIAGNOSIS — Z4689 Encounter for fitting and adjustment of other specified devices: Secondary | ICD-10-CM | POA: Diagnosis not present

## 2017-09-20 DIAGNOSIS — E785 Hyperlipidemia, unspecified: Secondary | ICD-10-CM | POA: Diagnosis not present

## 2017-09-20 DIAGNOSIS — K9189 Other postprocedural complications and disorders of digestive system: Secondary | ICD-10-CM | POA: Diagnosis not present

## 2017-09-20 DIAGNOSIS — K832 Perforation of bile duct: Secondary | ICD-10-CM | POA: Diagnosis not present

## 2017-09-20 HISTORY — PX: IR RADIOLOGIST EVAL & MGMT: IMG5224

## 2017-09-20 NOTE — Progress Notes (Signed)
Referring Physician(s): Dr Clyda Greener  Chief Complaint: The patient is seen in follow up today s/p 08/24/17 Successful CT guided placement of a 10 French all purpose drain catheter into the gallbladder fossa abscess.    History of present illness:  History of cholecystectomy, history of laparoscopic cholecystectomy on 08/10/2017 with findings of chronic cholecystitis/cholelithiasis. Recurrent right upper quadrant abdominal pain, fever and chills. CT scan performed of the abdomen and pelvis on 07/23/2017 demonstrates a large air and fluid collection within the gallbladder fossa worrisome for an abscess. Drain placed 11/21  Since placement pt has had biliary stent and pancreatic stent placed at [facility] (Biliary stent had been attempted by Dr Fuller Plan at [facility] 1 week ago-- unsuccessful; sent to [facility] for evaluation and stent placement--- he was in [place] for 5 days.)  Returns to [facility] for evaluation of GB fossa abscess Feeling well Denies pain; denies fever/chills OP of drain = flushes daily 5 cc sterile saline daily; OP color is brownish; bilious. CT today show resolution of abscess per Dr Charlynn Court injection with 10 cc contrast shows bile duct leak remains  Past Medical History:  Diagnosis Date  . Arthritis    "hands" (05/13/2016)  . Basal cell carcinoma    "face, back"  . Bleeding per rectum 05/13/2016  . BPH (benign prostatic hyperplasia) 09/2015  . Cholelithiasis 09/2015   noted on CT done by urology for his gross hematuria w/u--pt was referred to gen surg by Dr. Ralene Muskrat office  . Chronic thrombocytopenic purpura ([place])    [name] 05/13/2016  . Complication of anesthesia    slow to awaken after anesthesia  . Diplopia 2017   left eye.  glasses lens fitted with a prism  . Gross hematuria 09/2015   Painless.  cystoscopy ~ 09/2015, Dr Jeffie Pollock.  Conclusion (per pt) is source of blood is the prostate.    . Hyperlipidemia     Past Surgical History:  Procedure  Laterality Date  . COLONOSCOPY  11/05/2002   Internal hemorrhoids, hyperplastic sigmoid polyp, ascending erythema, descending venous lake, hypertrophied papilla. Dr. Aurelio Jew, [place]  . COLONOSCOPY N/A 05/14/2016   per Dr. Carlean Purl, hypertrophied anal papillae but no polyps, no repeats needed   . CYSTOSCOPY  ~ 09/2015   to eval gross hematuria.  Dr Jeffie Pollock at [facility] urology.   . ENDOSCOPIC RETROGRADE CHOLANGIOPANCREATOGRAPHY (ERCP) WITH PROPOFOL N/A 08/30/2017   Procedure: ENDOSCOPIC RETROGRADE CHOLANGIOPANCREATOGRAPHY (ERCP) WITH PROPOFOL;  Surgeon: Ladene Artist, MD;  Location: [facility] END[facility];  Service: Endoscopy;  Laterality: N/A;  . IR RADIOLOGIST EVAL & MGMT  09/08/2017  . IR RADIOLOGIST EVAL & MGMT  09/20/2017  . LAPAROSCOPIC CHOLECYSTECTOMY SINGLE SITE WITH INTRAOPERATIVE CHOLANGIOGRAM N/A 08/10/2017   Procedure: LAPAROSCOPIC CHOLECYSTECTOMY SINGLE SITE WITH INTRAOPERATIVE CHOLANGIOGRAM;  Surgeon: Michael Boston, MD;  Location: [facility];  Service: General;  Laterality: N/A;  . right site perc tube  08/24/2017    Allergies: Penicillins; Magnesium-containing compounds; and Prednisone  Medications: Prior to Admission medications   Medication Sig Start Date End Date Taking? Authorizing Provider  acetaminophen (TYLENOL) 500 MG tablet Take 500-1,000 mg by mouth every 6 (six) hours as needed for mild pain, fever or headache.     [provider]  ciprofloxacin (CIPRO) 500 MG tablet Take 500 mg by mouth 2 (two) times daily. Started 11/21 for 10 days    [provider]  HYDROcodone-acetaminophen (NORCO/VICODIN) 5-325 MG tablet Take 1 tablet by mouth every 4 (four) hours as needed for severe pain. 08/26/17  Blanchie Dessert, MD  metoCLOPramide (REGLAN) 10 MG tablet Take 1 tablet (10 mg total) by mouth every 6 (six) hours as needed for nausea or vomiting. 08/26/17   Blanchie Dessert, MD  metroNIDAZOLE (FLAGYL) 500 MG tablet Take 500 mg by mouth 2 (two) times daily. Started 11/21 for  10 days    [provider]  traMADol (ULTRAM) 50 MG tablet Take 1-2 tablets (50-100 mg total) every 6 (six) hours as needed by mouth for moderate pain or severe pain. 08/10/17   Michael Boston, MD     Family History  Problem Relation Age of Onset  . Ovarian cancer Unknown   . Heart disease Unknown   . Heart disease Father 39       Died age 32    Social History   Socioeconomic History  . Marital status: Married    Spouse name: Not on file  . Number of children: Not on file  . Years of education: Not on file  . Highest education level: Not on file  Social Needs  . Financial resource strain: Not on file  . Food insecurity - worry: Not on file  . Food insecurity - inability: Not on file  . Transportation needs - medical: Not on file  . Transportation needs - non-medical: Not on file  Occupational History  . Not on file  Tobacco Use  . Smoking status: Never Smoker  . Smokeless tobacco: Never Used  Substance and Sexual Activity  . Alcohol use: No    Alcohol/week: 0.0 oz  . Drug use: No  . Sexual activity: Yes  Other Topics Concern  . 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       Vital Signs: BP 125/67   Pulse 84   Temp 98.1 F (36.7 C)   SpO2 99%   Physical Exam  Constitutional: He is oriented to person, place, and time.  Abdominal: Soft. Bowel sounds are normal.  Musculoskeletal: Normal range of motion.  Neurological: He is alert and oriented to person, place, and time.  Skin: Skin is warm and dry.  Site of drain is clean and dry NT no bleeding No sign of infection OP cloudy/bilious    Psychiatric: He has a normal mood and affect. His behavior is normal.  Nursing note and vitals reviewed.   Imaging: Ir Radiologist Eval & Mgmt  Result Date: 09/20/2017 Please refer to notes tab for details about interventional procedure. (Op Note)   Labs:  CBC: Recent Labs    08/08/17 1019 08/24/17 1125 08/26/17 1230    WBC 5.8 13.7* 10.9*  HGB 13.1 11.4* 12.2*  HCT 39.6 33.3* 35.4*  PLT 144* 390 407*    COAGS: Recent Labs    08/24/17 1125  INR 1.24    BMP: Recent Labs    03/15/17 1023 08/08/17 1019 08/24/17 1125 08/26/17 1230  NA  --  138 127* 131*  K  --  4.7 4.4 3.8  CL  --  104 92* 93*  CO2  --  26 22 26   GLUCOSE  --  97 102* 156*  BUN  --  16 16 14   CALCIUM  --  8.8* 8.7* 8.8*  CREATININE 1.20 1.16 1.04 1.02  GFRNONAA  --  58* >60 >60  GFRAA  --  >60 >60 >60    LIVER FUNCTION TESTS: Recent Labs    08/24/17 1125 08/26/17 1230  BILITOT 1.4* 0.8  AST 32 26  ALT 32  27  ALKPHOS 149* 134*  PROT 7.3 7.7  ALBUMIN 2.9* 2.8*    Assessment: GB fossa abscess post 11/7 cholecystectomy GB fossa abscess drain placed in IR 11/21. Drain injections confirms fistula/bile leak persists Pt and wife aware that Bile leak still exists Drain will remain for now. Drain injection and recheck in 2 weeks They are agreeable to plan  Signed: Ascencion Dike, PA-C 09/20/2017, 10:17 AM   Please refer to Dr. Anselm Pancoast attestation of this note for management and plan.

## 2017-09-21 DIAGNOSIS — Z9049 Acquired absence of other specified parts of digestive tract: Secondary | ICD-10-CM | POA: Diagnosis not present

## 2017-09-21 DIAGNOSIS — Z4582 Encounter for adjustment or removal of myringotomy device (stent) (tube): Secondary | ICD-10-CM | POA: Diagnosis not present

## 2017-09-21 DIAGNOSIS — Z4689 Encounter for fitting and adjustment of other specified devices: Secondary | ICD-10-CM | POA: Diagnosis not present

## 2017-09-21 DIAGNOSIS — K838 Other specified diseases of biliary tract: Secondary | ICD-10-CM | POA: Diagnosis not present

## 2017-09-21 DIAGNOSIS — K839 Disease of biliary tract, unspecified: Secondary | ICD-10-CM | POA: Diagnosis not present

## 2017-09-21 DIAGNOSIS — Z9889 Other specified postprocedural states: Secondary | ICD-10-CM | POA: Diagnosis not present

## 2017-09-23 DIAGNOSIS — R2689 Other abnormalities of gait and mobility: Secondary | ICD-10-CM | POA: Diagnosis not present

## 2017-09-23 DIAGNOSIS — K832 Perforation of bile duct: Secondary | ICD-10-CM | POA: Diagnosis not present

## 2017-09-23 DIAGNOSIS — K9189 Other postprocedural complications and disorders of digestive system: Secondary | ICD-10-CM | POA: Diagnosis not present

## 2017-09-23 DIAGNOSIS — E785 Hyperlipidemia, unspecified: Secondary | ICD-10-CM | POA: Diagnosis not present

## 2017-09-26 DIAGNOSIS — E785 Hyperlipidemia, unspecified: Secondary | ICD-10-CM | POA: Diagnosis not present

## 2017-09-26 DIAGNOSIS — K832 Perforation of bile duct: Secondary | ICD-10-CM | POA: Diagnosis not present

## 2017-09-26 DIAGNOSIS — R2689 Other abnormalities of gait and mobility: Secondary | ICD-10-CM | POA: Diagnosis not present

## 2017-09-26 DIAGNOSIS — K9189 Other postprocedural complications and disorders of digestive system: Secondary | ICD-10-CM | POA: Diagnosis not present

## 2017-09-28 ENCOUNTER — Telehealth: Payer: Self-pay | Admitting: Family Medicine

## 2017-09-28 DIAGNOSIS — E785 Hyperlipidemia, unspecified: Secondary | ICD-10-CM | POA: Diagnosis not present

## 2017-09-28 DIAGNOSIS — R2689 Other abnormalities of gait and mobility: Secondary | ICD-10-CM | POA: Diagnosis not present

## 2017-09-28 DIAGNOSIS — R2681 Unsteadiness on feet: Secondary | ICD-10-CM

## 2017-09-28 DIAGNOSIS — K812 Acute cholecystitis with chronic cholecystitis: Secondary | ICD-10-CM

## 2017-09-28 DIAGNOSIS — K832 Perforation of bile duct: Secondary | ICD-10-CM | POA: Diagnosis not present

## 2017-09-28 DIAGNOSIS — K9189 Other postprocedural complications and disorders of digestive system: Secondary | ICD-10-CM | POA: Diagnosis not present

## 2017-09-28 NOTE — Telephone Encounter (Signed)
The referral was done  

## 2017-09-28 NOTE — Telephone Encounter (Signed)
Copied from [place]. Topic: General - Other >> Sep 28, 2017  1:35 PM Lennox Solders wrote: Reason for CRM: [facility] the pt will be discharged today from Physical therapy. Pt needs a referral to physical therapy [facility] at [place] for strengthening ,balance and ambulation.   I left a voice message for Tharon Aquas, referral was done.

## 2017-10-06 ENCOUNTER — Ambulatory Visit
Admission: RE | Admit: 2017-10-06 | Discharge: 2017-10-06 | Disposition: A | Discharge status: Home / Self Care | Payer: Medicare Other | Source: Ambulatory Visit | Attending: Surgery | Admitting: Surgery

## 2017-10-06 ENCOUNTER — Encounter: Payer: Self-pay | Admitting: Radiology

## 2017-10-06 DIAGNOSIS — R188 Other ascites: Secondary | ICD-10-CM

## 2017-10-06 DIAGNOSIS — Z4689 Encounter for fitting and adjustment of other specified devices: Secondary | ICD-10-CM | POA: Diagnosis not present

## 2017-10-06 DIAGNOSIS — K828 Other specified diseases of gallbladder: Secondary | ICD-10-CM | POA: Diagnosis not present

## 2017-10-06 DIAGNOSIS — Z434 Encounter for attention to other artificial openings of digestive tract: Secondary | ICD-10-CM | POA: Diagnosis not present

## 2017-10-06 HISTORY — PX: IR RADIOLOGIST EVAL & MGMT: IMG5224

## 2017-10-06 NOTE — Progress Notes (Signed)
Patient ID: Peter Chandler, male   DOB: 1941-02-01, 77 y.o.   MRN: [id number]        Chief Complaint: Drainage catheter evaluation and management  Referring Physician(s): Gross,[name]  History of Present Illness: Peter Chandler is a 77 y.o. male who is well-known to the interventional radiology Department following percutaneous drainage catheter placement 08/24/2017 into a gallbladder fossa abscess.    Subsequently CT scans have demonstrated resolution of the bladder fossa abscess, however percutaneous drainage catheter injections demonstrate a persistent fistulous connection between the decompressed gallbladder fossa abscess cavity and the residual cystic duct. Patient returns today to the interventional radiology drain clinic for drainage catheter evaluation and management.  The patient reports little to no output from the percutaneous drainage catheter for the past 10 days. Additionally, the patient states that when he attempts to flush the drainage catheter, approximate half the flush refluxes around the catheter fracture the skin surface.  Patient is otherwise without complaint. No fever or chills. No worsening abdominal pain.   Past Medical History:  Diagnosis Date  . Arthritis    "hands" (05/13/2016)  . Basal cell carcinoma    "face, back"  . Bleeding per rectum 05/13/2016  . BPH (benign prostatic hyperplasia) 09/2015  . Cholelithiasis 09/2015   noted on CT done by urology for his gross hematuria w/u--pt was referred to gen surg by Dr. Ralene Muskrat office  . Chronic thrombocytopenic purpura ([place])    [name] 05/13/2016  . Complication of anesthesia    slow to awaken after anesthesia  . Diplopia 2017   left eye.  glasses lens fitted with a prism  . Gross hematuria 09/2015   Painless.  cystoscopy ~ 09/2015, Dr Jeffie Pollock.  Conclusion (per pt) is source of blood is the prostate.    . Hyperlipidemia     Past Surgical History:  Procedure Laterality Date  . COLONOSCOPY  11/05/2002   Internal hemorrhoids, hyperplastic sigmoid polyp, ascending erythema, descending venous lake, hypertrophied papilla. Dr. Aurelio Jew, [place]  . COLONOSCOPY N/A 05/14/2016   per Dr. Carlean Purl, hypertrophied anal papillae but no polyps, no repeats needed   . CYSTOSCOPY  ~ 09/2015   to eval gross hematuria.  Dr Jeffie [facility] at [facility].   . ENDOSCOPIC RETROGRADE CHOLANGIOPANCREATOGRAPHY (ERCP) WITH PROPOFOL N/A 08/30/2017   Procedure: ENDOSCOPIC RETROGRADE CHOLANGIOPANCREATOGRAPHY (ERCP) WITH PROPOFOL;  Surgeon: Ladene Artist, MD;  [facility];  Service: Endoscopy;  Laterality: N/A;  . IR RADIOLOGIST EVAL & MGMT  09/08/2017  . IR RADIOLOGIST EVAL & MGMT  09/20/2017  . IR RADIOLOGIST EVAL & MGMT  10/06/2017  . LAPAROSCOPIC CHOLECYSTECTOMY SINGLE SITE WITH INTRAOPERATIVE CHOLANGIOGRAM N/A 08/10/2017   Procedure: LAPAROSCOPIC CHOLECYSTECTOMY SINGLE SITE WITH INTRAOPERATIVE CHOLANGIOGRAM;  Surgeon: Michael Boston, MD;  Location: [facility];  Service: General;  Laterality: N/A;  . right site perc tube  08/24/2017    Allergies: Penicillins; Magnesium-containing compounds; and Prednisone  Medications: Prior to Admission medications   Medication Sig Start Date End Date Taking? Authorizing Provider  acetaminophen (TYLENOL) 500 MG tablet Take 500-1,000 mg by mouth every 6 (six) hours as needed for mild pain, fever or headache.     [provider]  HYDROcodone-acetaminophen (NORCO/VICODIN) 5-325 MG tablet Take 1 tablet by mouth every 4 (four) hours as needed for severe pain. 08/26/17   Blanchie Dessert, MD  metoCLOPramide (REGLAN) 10 MG tablet Take 1 tablet (10 mg total) by mouth every 6 (six) hours as needed for nausea or vomiting. 08/26/17   Blanchie Dessert, MD  traMADol (ULTRAM) 50 MG tablet Take 1-2 tablets (50-100 mg total) every 6 (six) hours as needed by mouth for moderate pain or severe pain. 08/10/17   Michael Boston, MD     Family History  Problem Relation Age of Onset  . Ovarian  cancer Unknown   . Heart disease Unknown   . Heart disease Father 67       Died age 20    Social History   Socioeconomic History  . Marital status: Married    Spouse name: Not on file  . Number of children: Not on file  . Years of education: Not on file  . Highest education level: Not on file  Social Needs  . Financial resource strain: Not on file  . Food insecurity - worry: Not on file  . Food insecurity - inability: Not on file  . Transportation needs - medical: Not on file  . Transportation needs - non-medical: Not on file  Occupational History  . Not on file  Tobacco Use  . Smoking status: Never Smoker  . Smokeless tobacco: Never Used  Substance and Sexual Activity  . Alcohol use: No    Alcohol/week: 0.0 oz  . Drug use: No  . Sexual activity: Yes  Other Topics Concern  . 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      ECOG Status: 1 - Symptomatic but completely ambulatory  Review of Systems: A 12 point ROS discussed and pertinent positives are indicated in the HPI above.  All other systems are negative.  Review of Systems  Vital Signs: BP 124/79   Pulse 79   Temp 98.1 F (36.7 C)   SpO2 100%   Physical Exam   Imaging: Ct Abdomen Pelvis W Contrast  Result Date: 09/08/2017 CLINICAL DATA:  History of cholecystectomy and postoperative abscess. EXAM: CT ABDOMEN AND PELVIS WITH CONTRAST TECHNIQUE: Multidetector CT imaging of the abdomen and pelvis was performed using the standard protocol following bolus administration of intravenous contrast. CONTRAST:  139mL ISOVUE-300 IOPAMIDOL (ISOVUE-300) INJECTION 61% COMPARISON:  08/26/2017 FINDINGS: Lower chest: Small right pleural effusion has slightly enlarged in size. Compressive atelectasis in the right lower lobe. Mild atelectasis in the left lower lobe. Hepatobiliary: Percutaneous drainage catheter at gallbladder fossa is stable in position. There is no significant fluid or gas  around the drain. Portal venous system is patent. Again noted is a cystic structure at the hepatic dome measuring roughly 2.3 cm. Small amount of pneumobilia and there is a nonmetallic biliary stent in the extrahepatic biliary system. Pancreas: Small amount of fluid or edema along the pancreatic tail is new. Small amount gas within the main pancreatic duct. No significant pancreatic duct dilatation. Spleen: Normal appearance of spleen without enlargement. Adrenals/Urinary Tract: Normal appearance of both adrenal glands. No appearance of both kidneys without hydronephrosis. Urinary bladder is unremarkable. Stomach/Bowel: Normal appendix. Small amount of gas within the bowel structures but no evidence for obstruction. Mild edema in the mesentery and paracolic regions. Normal appearance of stomach. Vascular/Lymphatic: Atherosclerotic calcifications in the abdominal aorta and visceral arteries without aneurysm. No significant lymph node enlargement in the abdomen or pelvis. Reproductive: Prostate and seminal vesicles are unremarkable. Other: No free fluid.  No new abscess collections. Musculoskeletal: Disc space narrowing with retrolisthesis and endplate disease at G2-R4. IMPRESSION: New fluid/edema along the pancreatic tail may be secondary to pancreatitis. Fluid collection at the gallbladder fossa has resolved with the percutaneous drain. Stable position of the percutaneous  drain. Biliary stent is in place. Mild enlargement of the right pleural effusion. Electronically Signed   By: Markus Daft M.D.   On: 09/08/2017 16:35   Dg Sinus/fist Tube Chk-non Gi  Result Date: 10/06/2017 CLINICAL DATA:  History of bile leak following laparoscopic cholecystectomy, post percutaneous drainage catheter placement on 08/24/2017. Subsequent imaging demonstrates resolution of the abscess cavity however drainage catheter injections demonstrated persistent communication between the decompressed gallbladder fossa abscess and the residual  cystic duct. Patient returns today to the interventional radiology drain Clinic for drainage catheter evaluation and management The patient reports little to no output from the percutaneous drainage catheter for the past 10 days. Additionally, the patient states that when he attempts to flush the drainage catheter approximately half of the flush reflux is around the catheter track to the skin surface. Patient denies fever or chills. EXAM: ABSCESS INJECTION COMPARISON:  Fluoroscopic guided drainage catheter injection -09/20/2017; 09/08/2017; CT abdomen pelvis - 09/08/2017; 08/23/2017; CT-guided gallbladder fossa abscess drainage catheter placement - 08/24/2017 CONTRAST:  10 cc Omnipaque 300, administered via the existing percutaneous drainage catheter FLUOROSCOPY TIME:  36 seconds (8.7 mGy) TECHNIQUE: The patient was positioned supine on the fluoroscopy table. A preprocedural spot fluoroscopic image was obtained of the right upper abdominal quadrant and existing percutaneous drainage catheter Multiple spot fluoroscopic and radiographic images were obtained following the injection of a small amount of contrast via the existing percutaneous drainage catheter. Images reviewed and the decision was made to remove the percutaneous drainage catheter The external portion of the drainage catheter was cut and drainage catheter was removed intact. A dressing was placed. The patient tolerated the above procedure well without immediate postprocedural complication. FINDINGS: Preprocedural spot fluoroscopic image demonstrates unchanged positioning of the gallbladder fossa drainage catheter overlying the right upper abdominal quadrant adjacent to the cholecystectomy clips. Unchanged positioning of plastic internal biliary stent overlying expected location of the common bile duct Contrast injection demonstrates opacification of the decompressed gallbladder fossa abscess with reflux of contrast along the catheter tract to the entrance  site at the skin surface. There is no definitive persistent fistulous connection between the decompressed abscess cavity and the residual cystic duct. IMPRESSION: Resolved fistula between the decompressed gallbladder fossa abscess cavity and the residual cystic duct. Given this finding as well as lack of any output from the drainage catheter for the past 10 days, the decision was made to remove the drainage catheter which was performed at the patient's bedside without incident. Electronically Signed   By: Sandi Mariscal M.D.   On: 10/06/2017 11:04   Dg Sinus/fist Tube Chk-non Gi  Result Date: 09/20/2017 INDICATION: 77 year old male with a history of bile leak following laparoscopic cholecystectomy. A percutaneous drainage catheter was placed on 08/24/2017. Subsequent imaging demonstrates resolution of the abscess cavity but a persistent fistulous communication with the biliary tree via the cystic duct. Patient presents today for 2 week follow-up evaluation. EXAM: Drain injection under fluoroscopy MEDICATIONS: None ANESTHESIA/SEDATION: None COMPLICATIONS: None immediate. PROCEDURE: Informed written consent was obtained from the patient after a thorough discussion of the procedural risks, benefits and alternatives. All questions were addressed. A timeout was performed prior to the initiation of the procedure. An initial spot image demonstrates the percutaneous drainage catheter in unchanged position. Additionally, there is a plastic biliary stent, and a plastic stent in the main pancreatic duct. A gentle hand injection of contrast material through the existing drainage catheter demonstrates a largely collapsed abscess cavity. Unfortunately, there is persistent filling of the cystic duct.  The cystic duct then fills the common bile duct. IMPRESSION: Persistent communication between the drainage catheter in the gallbladder fossa and the cystic duct. Electronically Signed   By: Jacqulynn Cadet M.D.   On: 09/20/2017  10:51   Dg Sinus/fist Tube Chk-non Gi  Result Date: 09/08/2017 INDICATION: History of cholecystectomy and gallbladder fossa abscess. Percutaneous drainage catheter placed on 08/24/2017. EXAM: DRAIN INJECTION WITH FLUOROSCOPY MEDICATIONS: None ANESTHESIA/SEDATION: None FLUOROSCOPY TIME:  36 seconds, 38 mGy COMPLICATIONS: None immediate. PROCEDURE: Patient was placed supine on the fluoroscopic table. Scout image was obtained. 7 mL Omnipaque 300 was injected through the percutaneous drain. Drain was flushed with saline at the end of the procedure. Drain was attached to a gravity bag. FINDINGS: Percutaneous drain in the right upper quadrant. There is a nonmetallic biliary stent. Contrast fills the gallbladder fossa and there is filling of appears to be the cystic duct. Contrast eventually fills the common bile duct and drains into the duodenum. IMPRESSION: Fistula connection between the gallbladder fossa and the biliary system. The connection appears to be at the cystic duct. Percutaneous drain was kept in place. Drain was attached to a gravity bag. Electronically Signed   By: Markus Daft M.D.   On: 09/08/2017 16:40   Ir Radiologist Eval & Mgmt  Result Date: 10/06/2017 Please refer to notes tab for details about interventional procedure. (Op Note)  Ir Radiologist Eval & Mgmt  Result Date: 09/20/2017 Please refer to notes tab for details about interventional procedure. (Op Note)  Ir Radiologist Eval & Mgmt  Result Date: 09/08/2017 Please refer to notes tab for details about interventional procedure. (Op Note)   Labs:  CBC: Recent Labs    08/08/17 1019 08/24/17 1125 08/26/17 1230  WBC 5.8 13.7* 10.9*  HGB 13.1 11.4* 12.2*  HCT 39.6 33.3* 35.4*  PLT 144* 390 407*    COAGS: Recent Labs    08/24/17 1125  INR 1.24    BMP: Recent Labs    03/15/17 1023 08/08/17 1019 08/24/17 1125 08/26/17 1230  NA  --  138 127* 131*  K  --  4.7 4.4 3.8  CL  --  104 92* 93*  CO2  --  26 22 26     GLUCOSE  --  97 102* 156*  BUN  --  16 16 14   CALCIUM  --  8.8* 8.7* 8.8*  CREATININE 1.20 1.16 1.04 1.02  GFRNONAA  --  58* >60 >60  GFRAA  --  >60 >60 >60    LIVER FUNCTION TESTS: Recent Labs    08/24/17 1125 08/26/17 1230  BILITOT 1.4* 0.8  AST 32 26  ALT 32 27  ALKPHOS 149* 134*  PROT 7.3 7.7  ALBUMIN 2.9* 2.8*    TUMOR MARKERS: No results for input(s): AFPTM, CEA, CA199, CHROMGRNA in the last 8760 hours.  Assessment and Plan:  Peter Chandler is a 77 y.o. male who is well-known to the interventional radiology Department following percutaneous drainage catheter placement 08/24/2017 into a gallbladder fossa abscess.    Subsequently CT scans have demonstrated resolution of the bladder fossa abscess.   Fortunately, today's fluoroscopic guided drainage catheter injection demonstrates apparent resolution of the biliary leak without definitive opacification biliary system.  Given the above findings as well as clinical history of no significant output for the past 10 days, the decision was made to remove the drainage catheter.  This was done so at the patient's bedside without incident.  Patient was encouraged to keep all subsequent follow-up appointments  with Dr. Johney Maine.  Patient was encouraged call the interventional radiology drain clinic with any future questions or concerns though may otherwise follow-up on a when necessary basis.  A copy of this report was sent to the requesting provider on this date.  Electronically Signed: Sandi Mariscal 10/06/2017, 11:18 AM   I spent a total of 10 Minutes in face to face in clinical consultation, greater than 50% of which was counseling/coordinating care for drainage catheter evaluation and management.

## 2017-10-11 ENCOUNTER — Encounter: Payer: Self-pay | Admitting: Physical Therapy

## 2017-10-11 ENCOUNTER — Ambulatory Visit: Payer: Medicare Other | Attending: Family Medicine | Admitting: Physical Therapy

## 2017-10-11 DIAGNOSIS — R262 Difficulty in walking, not elsewhere classified: Secondary | ICD-10-CM | POA: Insufficient documentation

## 2017-10-11 DIAGNOSIS — M6281 Muscle weakness (generalized): Secondary | ICD-10-CM | POA: Diagnosis not present

## 2017-10-11 NOTE — Therapy (Signed)
[facility] [address], [place], [place], [address] Phone: [phone]   Fax:  [phone]  Physical Therapy Evaluation  Patient Details  Name: Peter Chandler MRN: [id number] Date of Birth: 04/23/1941 Referring Provider: Dr. Alysia Penna   Encounter Date: 10/11/2017  PT End of Session - 10/11/17 1143    Visit Number  1    Date for PT Re-Evaluation  11/22/17    Authorization Type  Medicare    PT Start Time  1100    PT Stop Time  1145    PT Time Calculation (min)  45 min    Activity Tolerance  Patient tolerated treatment well       Past Medical History:  Diagnosis Date  . Arthritis    "hands" (05/13/2016)  . Basal cell carcinoma    "face, back"  . Bleeding per rectum 05/13/2016  . BPH (benign prostatic hyperplasia) 09/2015  . Cholelithiasis 09/2015   noted on CT done by urology for his gross hematuria w/u--pt was referred to gen surg by Dr. Ralene Muskrat office  . Chronic thrombocytopenic purpura ([place])    [name] 05/13/2016  . Complication of anesthesia    slow to awaken after anesthesia  . Diplopia 2017   left eye.  glasses lens fitted with a prism  . Gross hematuria 09/2015   Painless.  cystoscopy ~ 09/2015, Dr Jeffie Pollock.  Conclusion (per pt) is source of blood is the prostate.    . Hyperlipidemia     Past Surgical History:  Procedure Laterality Date  . COLONOSCOPY  11/05/2002   Internal hemorrhoids, hyperplastic sigmoid polyp, ascending erythema, descending venous lake, hypertrophied papilla. Dr. Aurelio Jew, [place]  . COLONOSCOPY N/A 05/14/2016   per Dr. Carlean Purl, hypertrophied anal papillae but no polyps, no repeats needed   . CYSTOSCOPY  ~ 09/2015   to eval gross hematuria.  Dr Jeffie Pollock at [facility] urology.   . ENDOSCOPIC RETROGRADE CHOLANGIOPANCREATOGRAPHY (ERCP) WITH PROPOFOL N/A 08/30/2017   Procedure: ENDOSCOPIC RETROGRADE CHOLANGIOPANCREATOGRAPHY (ERCP) WITH PROPOFOL;  Surgeon: Ladene Artist, MD;  Location: [facility]  ENDOSCOPY;  Service: Endoscopy;  Laterality: N/A;  . IR RADIOLOGIST EVAL & MGMT  09/08/2017  . IR RADIOLOGIST EVAL & MGMT  09/20/2017  . IR RADIOLOGIST EVAL & MGMT  10/06/2017  . LAPAROSCOPIC CHOLECYSTECTOMY SINGLE SITE WITH INTRAOPERATIVE CHOLANGIOGRAM N/A 08/10/2017   Procedure: LAPAROSCOPIC CHOLECYSTECTOMY SINGLE SITE WITH INTRAOPERATIVE CHOLANGIOGRAM;  Surgeon: Michael Boston, MD;  Location: [facility]S;  Service: General;  Laterality: N/A;  . right site perc tube  08/24/2017    There were no vitals filed for this visit.   Subjective Assessment - 10/11/17 1108    Subjective  Had gallbladder surgery 11/7 with lots of complications and transferred to [facility].   Discharged home before Christmas.   Had home care PT for walking and standing ex but still feels weak and not back to all of previous activities.  No pain just some "pull" in low back.  Soreness in abdomen from stent/surgical.      Pertinent History  Erratic heart beat not treatment or precautions;  use abdominal binder when walking    Limitations  Walking;House hold activities    How long can you walk comfortably?  20 min (previously 2 hours)    Patient Stated Goals  Get my strength back, build muscle     Currently in Pain?  No/denies    Pain Score  0-No pain    Pain Location  Back    Pain Orientation  Lower;Right;Left    Aggravating Factors   nothing I can identify just pulls not really pain    Pain Relieving Factors  walking helps         [facility] PT Assessment - 10/11/17 0001      Assessment   Medical Diagnosis  weakness following gall bladder surgery    Referring Provider  Dr. Alysia Penna    Onset Date/Surgical Date  08/10/17    Next MD Visit  no follow up     Prior Therapy  HHPT      Precautions   Required Braces or Orthoses  Other Brace/Splint    Other Brace/Splint  corset abdominal binder wear when walking patient is carrying in a box      Restrictions   Weight Bearing Restrictions  No      Balance Screen   Has the  patient fallen in the past 6 months  No    Has the patient had a decrease in activity level because of a fear of falling?   No    Is the patient reluctant to leave their home because of a fear of falling?   No      Home Environment   Living Environment  Private residence    Type of [place] to enter    Entrance Stairs-Number of Steps  2    Home Layout  Two level;Able to live on main level with bedroom/bathroom      Prior Function   Level of Independence  Independent    Vocation  Retired    Leisure  Technical brewer, build furniture      AROM   AROM Assessment Site  -- UE and LE [employer]      Strength   Strength Assessment Site  -- core strength grossly 4-/5;  hip abductors and extensors 4+    Right/Left Hip  -- Able to rise from a standard chair without UE assist      6 Minute Walk- Baseline   6 Minute Walk- Baseline  yes 1200 no rest breaks      Standardized Balance Assessment   Five times sit to stand comments   12 sec      Berg Balance Test   Sit to Stand  Able to stand without using hands and stabilize independently    Standing Unsupported  Able to stand safely 2 minutes    Sitting with Back Unsupported but Feet Supported on Floor or Stool  Able to sit safely and securely 2 minutes    Stand to Sit  Sits safely with minimal use of hands    Transfers  Able to transfer safely, minor use of hands    Standing Unsupported with Eyes Closed  Able to stand 10 seconds safely    Standing Ubsupported with Feet Together  Able to place feet together independently and stand 1 minute safely    From Standing, Reach Forward with Outstretched Arm  Can reach confidently >25 cm (10")    From Standing Position, Pick up Object from Floor  Able to pick up shoe safely and easily    From Standing Position, Turn to Look Behind Over each Shoulder  Looks behind from both sides and weight shifts well    Turn 360 Degrees  Able to turn 360 degrees safely in 4 seconds or less     Standing Unsupported, Alternately Place Feet on Step/Stool  Able to stand independently and safely and complete 8  steps in 20 seconds    Standing Unsupported, One Foot in [place] to plae foot ahead of the other independently and hold 30 seconds    Standing on One Leg  Able to lift leg independently and hold equal to or more than 3 seconds    Total Score  53      Dynamic Gait Index   Level Surface  Normal    Change in Gait Speed  Normal    Gait with Horizontal Head Turns  Normal    Gait with Vertical Head Turns  Normal    Gait and Pivot Turn  Normal    Step Over Obstacle  Normal    Step Around Obstacles  Normal    Steps  Normal    Total Score  24      Timed Up and Go Test   Normal TUG (seconds)  10.3             Objective measurements completed on examination: See above findings.                PT Short Term Goals - 10/11/17 1410      PT SHORT TERM GOAL #1   Title  The patient will demonstrate understanding of initial HEP for strengthening and endurance    Time  3    Period  Weeks    Status  New    Target Date  11/01/17      PT SHORT TERM GOAL #2   Title  The patient will be able to walk 1300 feet in 6 minutes indicating improved gait speed needed for community ambulation    Time  3    Period  Weeks    Status  New        PT Long Term Goals - 10/11/17 1413      PT LONG TERM GOAL #1   Title  The patient will be independent in safe self progression of HEP and gym program    Time  6    Period  Weeks    Status  New    Target Date  11/22/17      PT LONG TERM GOAL #2   Title  Trunk (core) and LE strength 4+/5 to 5/5 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  He had multiple complications following his surgery including an infection and pancreatitis.  Following his hospitalization he reports general weakness and feeling unsteady.  He is not able to walk as long as he was previously and his endurance is compromised.  His general trunk/core strength is 4-/5, hip abduction and extension strength 4+/5.  His BERG balance score indicates low risk of falls with score 53/56.  Dynamic Gait Index is normal 24/24.  Timed up and Go is WFLs as well as 5x sit to stand.  Six minute walk test is below norm at 1200 feet (norm= 1600 feet).  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  He would benefit from PT to establish an appropriate HEP and gym program.      History and Personal Factors relevant to plan of care:  use abdominal binder when walking    Clinical Presentation  Stable    Clinical Presentation due to:  good home support of his wife;  minimal other co-morbidities    Clinical Decision Making  Low    Rehab Potential  Good    PT Frequency  2x / week    PT Duration  6 weeks    PT Treatment/Interventions  ADLs/Self Care Home Management;Neuromuscular re-education;Functional mobility training;Therapeutic exercise;Therapeutic activities;Patient/family education    PT Next Visit Plan  Nu-Step;  leg press; seated rows, pulldowns;  rebounder;   give HEP  (doing standing ex's from HHPT)    Consulted and Agree with Plan of Care  Patient       Patient will benefit from skilled therapeutic intervention in order to improve the following deficits and impairments:  Decreased activity tolerance, Decreased endurance, Decreased strength, Impaired perceived  functional ability, Difficulty walking  Visit Diagnosis: Muscle weakness (generalized) - Plan: PT plan of care cert/re-cert  Difficulty in walking, not elsewhere classified - Plan: PT plan of care cert/re-cert     Problem List Patient Active Problem List   Diagnosis Date Noted  . Bile leak   . Acute on chronic cholecystitis s/p lap cholecystectomy 08/10/2017 08/10/2017  . Murmur 07/25/2017  . Preop cardiovascular exam 07/25/2017  . Rectal bleeding   . Hemorrhoids, internal, with bleeding   . Hematochezia 05/13/2016  . BPH (benign prostatic hyperplasia) 05/13/2016  . Thrombocytopenia ([place]) 05/13/2016  . Elevated blood pressure reading without diagnosis of hypertension 05/13/2016  . Shingles 09/14/2012  . HLD (hyperlipidemia) 07/25/2007  . SYMPTOM, JOINT NEC, HAND 07/25/2007  . TB SKIN TEST, POSITIVE 07/25/2007  . SYNCOPE, HX OF 07/25/2007   Ruben Im, PT 10/11/17 5:34 PM Phone: [phone] Fax: [phone]  Alvera Singh 10/11/2017, 5:33 PM  [facility] [address] [place], [place], [address] Phone: [phone]   Fax:  [phone]  Name: OSMEL DYKSTRA MRN: [id number] Date of Birth: 09/13/1941

## 2017-10-13 ENCOUNTER — Encounter: Payer: Self-pay | Admitting: Physical Therapy

## 2017-10-13 ENCOUNTER — Ambulatory Visit: Payer: Medicare Other | Admitting: Physical Therapy

## 2017-10-13 DIAGNOSIS — R262 Difficulty in walking, not elsewhere classified: Secondary | ICD-10-CM

## 2017-10-13 DIAGNOSIS — M6281 Muscle weakness (generalized): Secondary | ICD-10-CM

## 2017-10-13 NOTE — Patient Instructions (Signed)
     For the core:  Foam roll push downs 10x                         Small weight shoulder to shoulder or hip to shoulder, around waist  1 minute each     Ruben Im PT [facility] [address], [place], [place] [address] Phone # [phone] Fax [phone]

## 2017-10-13 NOTE — Therapy (Signed)
[facility] [address] [place], [place], [address] Phone: [phone]   Fax:  [phone]  Physical Therapy Treatment  Patient Details  Name: Peter Chandler MRN: [id number] Date of Birth: January 05, 1941 Referring Provider: Dr. Alysia Penna   Encounter Date: 10/13/2017  PT End of Session - 10/13/17 1618    Visit Number  2    Date for PT Re-Evaluation  11/22/17    Authorization Type  Medicare    PT Start Time  1435    PT Stop Time  1520    PT Time Calculation (min)  45 min    Activity Tolerance  Patient tolerated treatment well       Past Medical History:  Diagnosis Date  . Arthritis    "hands" (05/13/2016)  . Basal cell carcinoma    "face, back"  . Bleeding per rectum 05/13/2016  . BPH (benign prostatic hyperplasia) 09/2015  . Cholelithiasis 09/2015   noted on CT done by urology for his gross hematuria w/u--pt was referred to gen surg by Dr. Ralene Muskrat office  . Chronic thrombocytopenic purpura ([place])    Peter Chandler 05/13/2016  . Complication of anesthesia    slow to awaken after anesthesia  . Diplopia 2017   left eye.  glasses lens fitted with a prism  . Gross hematuria 09/2015   Painless.  cystoscopy ~ 09/2015, Dr Jeffie Pollock.  Conclusion (per pt) is source of blood is the prostate.    . Hyperlipidemia     Past Surgical History:  Procedure Laterality Date  . COLONOSCOPY  11/05/2002   Internal hemorrhoids, hyperplastic sigmoid polyp, ascending erythema, descending venous lake, hypertrophied papilla. Dr. Aurelio Jew, [place]  . COLONOSCOPY N/A 05/14/2016   per Dr. Carlean Purl, hypertrophied anal papillae but no polyps, no repeats needed   . CYSTOSCOPY  ~ 09/2015   to eval gross hematuria.  Dr Jeffie Pollock at [facility] urology.   . ENDOSCOPIC RETROGRADE CHOLANGIOPANCREATOGRAPHY (ERCP) WITH PROPOFOL N/A 08/30/2017   Procedure: ENDOSCOPIC RETROGRADE CHOLANGIOPANCREATOGRAPHY (ERCP) WITH PROPOFOL;  Surgeon: Ladene Artist, MD;  [facility];  Service: Endoscopy;  Laterality: N/A;  . IR RADIOLOGIST EVAL & MGMT  09/08/2017  . IR RADIOLOGIST EVAL & MGMT  09/20/2017  . IR RADIOLOGIST EVAL & MGMT  10/06/2017  . LAPAROSCOPIC CHOLECYSTECTOMY SINGLE SITE WITH INTRAOPERATIVE CHOLANGIOGRAM N/A 08/10/2017   Procedure: LAPAROSCOPIC CHOLECYSTECTOMY SINGLE SITE WITH INTRAOPERATIVE CHOLANGIOGRAM;  Surgeon: Michael Boston, MD;  [facility]: [facility];  Service: General;  Laterality: N/A;  . right site perc tube  08/24/2017    There were no vitals filed for this visit.  Subjective Assessment - 10/13/17 1442    Subjective  Patient reports no pain and his soreness from abdominal surgery is improving.  I did my 20 min walk this morning.       Currently in Pain?  No/denies    Pain Score  0-No pain         [facility] PT Assessment - 10/13/17 0001      Observation/Other Assessments   Activities of Balance Confidence Scale (ABC Scale)   89% self confidence                  [facility] Adult PT Treatment/Exercise - 10/13/17 0001      Therapeutic Activites    Therapeutic Activities  ADL's    ADL's  walking, standing, sit to stand, stair climbing      Knee/Hip Exercises: Aerobic   Nustep  L2 10 min  Knee/Hip Exercises: Machines for Strengthening   Total Gym Leg Press  seat 7 Bil 70#, 35# single leg 15x each      Knee/Hip Exercises: Standing   Hip Abduction  Stengthening;Right;Left;10 reps    Abduction Limitations  yellow band     Hip Extension  Stengthening;Right;Left;10 reps    Extension Limitations  yellow band    Rebounder  weight shifting side to side, forward/backward, marching 1 min each      Shoulder Exercises: Standing   Row  Strengthening;20 reps;Theraband    Theraband Level (Shoulder Row)  Level 3 (Green)    Other Standing Exercises  green band shoulder extensions 20x    Other Standing Exercises  4# weight pass around; shoulder to shoulder, overhead 1 min each             PT Education - 10/13/17 1617     Education provided  Yes    Education Details  green band rows, shoulder extensions;  foam roll push downs (his wife has one);  small weight chops for core    Person(s) Educated  Patient    Methods  Explanation;Demonstration;Handout    Comprehension  Verbalized understanding;Returned demonstration       PT Short Term Goals - 10/11/17 1410      PT SHORT TERM GOAL #1   Title  The patient will demonstrate understanding of initial HEP for strengthening and endurance    Time  3    Period  Weeks    Status  New    Target Date  11/01/17      PT SHORT TERM GOAL #2   Title  The patient will be able to walk 1300 feet in 6 minutes indicating improved gait speed needed for community ambulation    Time  3    Period  Weeks    Status  New        PT Long Term Goals - 10/11/17 1413      PT LONG TERM GOAL #1   Title  The patient will be independent in safe self progression of HEP and gym program    Time  6    Period  Weeks    Status  New    Target Date  11/22/17      PT LONG TERM GOAL #2   Title  Trunk (core) and LE strength 4+/5 to 5/5 needed 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  He wears his abdominal binder throughout session.  No complaints of abdominal pain, soreness or excessive fatigue.  No loss of balance with single limb or unstable surfaces.  Very  receptive to progressing his  HEP.      Rehab Potential  Good    PT Frequency  2x / week    PT Duration  6 weeks    PT Treatment/Interventions  ADLs/Self Care Home Management;Neuromuscular re-education;Functional mobility training;Therapeutic exercise;Therapeutic activities;Patient/family education    PT Next Visit Plan  Nu-Step;  leg press; seated rows, pulldowns;  rebounder;   step ups       Patient will benefit from skilled therapeutic intervention in order to improve the following deficits and impairments:  Decreased activity tolerance, Decreased endurance, Decreased strength, Impaired perceived functional ability, Difficulty walking  Visit Diagnosis: Muscle weakness (generalized)  Difficulty in walking, not elsewhere classified     Problem List Patient Active Problem List   Diagnosis Date Noted  . Bile leak   . Acute on chronic cholecystitis s/p lap cholecystectomy 08/10/2017 08/10/2017  . Murmur 07/25/2017  . Preop cardiovascular exam 07/25/2017  . Rectal bleeding   . Hemorrhoids, internal, with bleeding   . Hematochezia 05/13/2016  . BPH (benign prostatic hyperplasia) 05/13/2016  . Thrombocytopenia ([place]) 05/13/2016  . Elevated blood pressure reading without diagnosis of hypertension 05/13/2016  . Shingles 09/14/2012  . HLD (hyperlipidemia) 07/25/2007  . SYMPTOM, JOINT NEC, HAND 07/25/2007  . TB SKIN TEST, POSITIVE 07/25/2007  . SYNCOPE, HX OF 07/25/2007   Peter Chandler, PT 10/13/17 4:22 PM Phone: [phone] Fax: [phone]  Peter Chandler 10/13/2017, 4:22 PM  [facility] [address], [place], [place], [address] Phone: [phone]   Fax:  [phone]  Name: Peter Chandler MRN: [id number] Date of Birth: 1941/09/01

## 2017-10-18 ENCOUNTER — Encounter: Payer: Self-pay | Admitting: Physical Therapy

## 2017-10-18 ENCOUNTER — Ambulatory Visit: Payer: Medicare Other | Admitting: Physical Therapy

## 2017-10-18 DIAGNOSIS — R262 Difficulty in walking, not elsewhere classified: Secondary | ICD-10-CM

## 2017-10-18 DIAGNOSIS — L821 Other seborrheic keratosis: Secondary | ICD-10-CM | POA: Diagnosis not present

## 2017-10-18 DIAGNOSIS — M6281 Muscle weakness (generalized): Secondary | ICD-10-CM

## 2017-10-18 DIAGNOSIS — L814 Other melanin hyperpigmentation: Secondary | ICD-10-CM | POA: Diagnosis not present

## 2017-10-18 DIAGNOSIS — L111 Transient acantholytic dermatosis [Grover]: Secondary | ICD-10-CM | POA: Diagnosis not present

## 2017-10-18 DIAGNOSIS — Z85828 Personal history of other malignant neoplasm of skin: Secondary | ICD-10-CM | POA: Diagnosis not present

## 2017-10-18 NOTE — Therapy (Signed)
[facility] [address], [place], [place], [address] Phone: [phone]   Fax:  [phone]  Physical Therapy Treatment  Patient Details  Name: Peter Chandler MRN: [id number] Date of Birth: 08-04-1941 Referring Provider: Dr. Alysia Penna   Encounter Date: 10/18/2017  PT End of Session - 10/18/17 1406    Visit Number  3    Date for PT Re-Evaluation  11/22/17    Authorization Type  Medicare    PT Start Time  1400    PT Stop Time  1440    PT Time Calculation (min)  40 min    Activity Tolerance  Patient tolerated treatment well       Past Medical History:  Diagnosis Date  . Arthritis    "hands" (05/13/2016)  . Basal cell carcinoma    "face, back"  . Bleeding per rectum 05/13/2016  . BPH (benign prostatic hyperplasia) 09/2015  . Cholelithiasis 09/2015   noted on CT done by urology for his gross hematuria w/u--pt was referred to gen surg by Dr. Ralene Muskrat office  . Chronic thrombocytopenic purpura ([place])    [name] 05/13/2016  . Complication of anesthesia    slow to awaken after anesthesia  . Diplopia 2017   left eye.  glasses lens fitted with a prism  . Gross hematuria 09/2015   Painless.  cystoscopy ~ 09/2015, Dr Jeffie Pollock.  Conclusion (per pt) is source of blood is the prostate.    . Hyperlipidemia     Past Surgical History:  Procedure Laterality Date  . COLONOSCOPY  11/05/2002   Internal hemorrhoids, hyperplastic sigmoid polyp, ascending erythema, descending venous lake, hypertrophied papilla. Dr. Aurelio Jew, [place]  . COLONOSCOPY N/A 05/14/2016   per Dr. Carlean Purl, hypertrophied anal papillae but no polyps, no repeats needed   . CYSTOSCOPY  ~ 09/2015   to eval gross hematuria.  Dr Jeffie Pollock at [facility] urology.   . ENDOSCOPIC RETROGRADE CHOLANGIOPANCREATOGRAPHY (ERCP) WITH PROPOFOL N/A 08/30/2017   Procedure: ENDOSCOPIC RETROGRADE CHOLANGIOPANCREATOGRAPHY (ERCP) WITH PROPOFOL;  Surgeon: Ladene Artist, MD;  Location: [facility]  ENDOSCOPY;  Service: Endoscopy;  Laterality: N/A;  . IR RADIOLOGIST EVAL & MGMT  09/08/2017  . IR RADIOLOGIST EVAL & MGMT  09/20/2017  . IR RADIOLOGIST EVAL & MGMT  10/06/2017  . LAPAROSCOPIC CHOLECYSTECTOMY SINGLE SITE WITH INTRAOPERATIVE CHOLANGIOGRAM N/A 08/10/2017   Procedure: LAPAROSCOPIC CHOLECYSTECTOMY SINGLE SITE WITH INTRAOPERATIVE CHOLANGIOGRAM;  Surgeon: Michael Boston, MD;  Location: [facility];  Service: General;  Laterality: N/A;  . right site perc tube  08/24/2017    There were no vitals filed for this visit.  Subjective Assessment - 10/18/17 1402    Subjective  Did fine after last visit, minor muscle soreness.  Reports intermittent right flank pain.  I've been doing my walking insided since it's been so cold.      Currently in Pain?  No/denies    Pain Score  0-No pain    Pain Location  Back                      [facility] Adult PT Treatment/Exercise - 10/18/17 0001      Therapeutic Activites    Therapeutic Activities  ADL's    ADL's  walking, standing, sit to stand, stair climbing      Lumbar Exercises: Seated   Other Seated Lumbar Exercises  Lat pull down 25# 15x      Knee/Hip Exercises: Stretches   Active Hamstring Stretch  Right;Left on 2nd step  Quad Stretch  Right;Left;5 reps 2nd step      Knee/Hip Exercises: Aerobic   Nustep  L3 10 min      Knee/Hip Exercises: Machines for Strengthening   Cybex Knee Flexion  25# 15x    Total Gym Leg Press  seat 7 Bil 80#, 40# single leg 15x each      Knee/Hip Exercises: Standing   Lateral Step Up  Right;Left;10 reps;Hand Hold: 2;Step Height: 6"    Forward Step Up  Right;Left;10 reps;Hand Hold: 2;Step Height: 6"    Walking with Sports Cord  20# forward and backward 5x    Other Standing Knee Exercises  red band pallof press 5 reps with 1 foot propped on BOSU      Shoulder Exercises: Standing   Other Standing Exercises  5# weight pass around; shoulder to shoulder, overhead 1 min each             PT  Education - 10/18/17 1442    Education provided  Yes    Education Details  step ups forward, lateral step ups;  psoas doorway stretches    Person(s) Educated  Patient    Methods  Explanation;Handout    Comprehension  Returned demonstration       PT Short Term Goals - 10/11/17 1410      PT SHORT TERM GOAL #1   Title  The patient will demonstrate understanding of initial HEP for strengthening and endurance    Time  3    Period  Weeks    Status  New    Target Date  11/01/17      PT SHORT TERM GOAL #2   Title  The patient will be able to walk 1300 feet in 6 minutes indicating improved gait speed needed for community ambulation    Time  3    Period  Weeks    Status  New        PT Long Term Goals - 10/11/17 1413      PT LONG TERM GOAL #1   Title  The patient will be independent in safe self progression of HEP and gym program    Time  6    Period  Weeks    Status  New    Target Date  11/22/17      PT LONG TERM GOAL #2   Title  Trunk (core) and LE strength 4+/5 to 5/5 needed for standing and doing furniture refinishing and furniture building     Time  6    Period  Weeks    Status  New      PT LONG TERM GOAL #3   Title  The patient will have BERG balance score to 55/56 indicating near normal static and dynamic balance    Time  6    Period  Weeks    Status  New      PT LONG TERM GOAL #4   Title  Six minute walk test improved to 1400 feet indicating improved gait speed needed for community ambulation    Time  6    Period  Weeks    Status  New            Plan - 10/18/17 1443    Clinical Impression Statement  The patient is progressing well with funtional reactivation, strengthening and dynamic balance.  He has difficulty with single leg balance and needs foot prop to stabilize.   Therapist closely monitoring his response with exercise for excessive fatigue or  pain production.  None reported.      Rehab Potential  Good    PT Frequency  2x / week    PT Duration  6  weeks    PT Treatment/Interventions  ADLs/Self Care Home Management;Neuromuscular re-education;Functional mobility training;Therapeutic exercise;Therapeutic activities;Patient/family education    PT Next Visit Plan  Nu-Step;  leg press;  rebounder;   step ups;  resisted walking add side stepping       Patient will benefit from skilled therapeutic intervention in order to improve the following deficits and impairments:  Decreased activity tolerance, Decreased endurance, Decreased strength, Impaired perceived functional ability, Difficulty walking  Visit Diagnosis: Muscle weakness (generalized)  Difficulty in walking, not elsewhere classified     Problem List Patient Active Problem List   Diagnosis Date Noted  . Bile leak   . Acute on chronic cholecystitis s/p lap cholecystectomy 08/10/2017 08/10/2017  . Murmur 07/25/2017  . Preop cardiovascular exam 07/25/2017  . Rectal bleeding   . Hemorrhoids, internal, with bleeding   . Hematochezia 05/13/2016  . BPH (benign prostatic hyperplasia) 05/13/2016  . Thrombocytopenia ([place]) 05/13/2016  . Elevated blood pressure reading without diagnosis of hypertension 05/13/2016  . Shingles 09/14/2012  . HLD (hyperlipidemia) 07/25/2007  . SYMPTOM, JOINT NEC, HAND 07/25/2007  . TB SKIN TEST, POSITIVE 07/25/2007  . SYNCOPE, HX OF 07/25/2007   Ruben Im, PT 10/18/17 2:49 PM Phone: [phone] Fax: [phone]  Alvera Singh 10/18/2017, 2:48 PM  [facility] [address] [place], [place], [address] Phone: [phone]   Fax:  [phone]  Name: Peter Chandler MRN: [id number] Date of Birth: 12/06/1940

## 2017-10-18 NOTE — Patient Instructions (Signed)
Kailo Kosik PT [facility] [address], [address] [place], [place] [address] Phone # [phone] Fax [phone]    

## 2017-10-20 ENCOUNTER — Ambulatory Visit: Payer: Medicare Other | Admitting: Physical Therapy

## 2017-10-20 DIAGNOSIS — R262 Difficulty in walking, not elsewhere classified: Secondary | ICD-10-CM

## 2017-10-20 DIAGNOSIS — M6281 Muscle weakness (generalized): Secondary | ICD-10-CM | POA: Diagnosis not present

## 2017-10-20 NOTE — Therapy (Signed)
[facility] [address], [place], [place], [address] Phone: [phone]   Fax:  [phone]  Physical Therapy Treatment  Patient Details  Name: Peter Chandler MRN: [id number] Date of Birth: [birth year] Referring Provider: Dr. Alysia Penna   Encounter Date: 10/20/2017  PT End of Session - 10/20/17 0936    Visit Number  4    Date for PT Re-Evaluation  11/22/17    Authorization Type  Medicare    PT Start Time  0925    PT Stop Time  1008    PT Time Calculation (min)  43 min    Activity Tolerance  Patient tolerated treatment well       Past Medical History:  Diagnosis Date  . Arthritis    "hands" (05/13/2016)  . Basal cell carcinoma    "face, back"  . Bleeding per rectum 05/13/2016  . BPH (benign prostatic hyperplasia) 09/2015  . Cholelithiasis 09/2015   noted on CT done by urology for his gross hematuria w/u--pt was referred to gen surg by Dr. Ralene Muskrat office  . Chronic thrombocytopenic purpura ([place])    [name] 05/13/2016  . Complication of anesthesia    slow to awaken after anesthesia  . Diplopia 2017   left eye.  glasses lens fitted with a prism  . Gross hematuria 09/2015   Painless.  cystoscopy ~ 09/2015, Dr Jeffie Pollock.  Conclusion (per pt) is source of blood is the prostate.    . Hyperlipidemia     Past Surgical History:  Procedure Laterality Date  . COLONOSCOPY  11/05/2002   Internal hemorrhoids, hyperplastic sigmoid polyp, ascending erythema, descending venous lake, hypertrophied papilla. Dr. Aurelio Jew, [place]  . COLONOSCOPY N/A 05/14/2016   per Dr. Carlean Purl, hypertrophied anal papillae but no polyps, no repeats needed   . CYSTOSCOPY  ~ 09/2015   to eval gross hematuria.  Dr Jeffie Pollock at [facility] urology.   . ENDOSCOPIC RETROGRADE CHOLANGIOPANCREATOGRAPHY (ERCP) WITH PROPOFOL N/A 08/30/2017   Procedure: ENDOSCOPIC RETROGRADE CHOLANGIOPANCREATOGRAPHY (ERCP) WITH PROPOFOL;  Surgeon: Ladene Artist, MD;  [facility];  Service: Endoscopy;  Laterality: N/A;  . IR RADIOLOGIST EVAL & MGMT  09/08/2017  . IR RADIOLOGIST EVAL & MGMT  09/20/2017  . IR RADIOLOGIST EVAL & MGMT  10/06/2017  . LAPAROSCOPIC CHOLECYSTECTOMY SINGLE SITE WITH INTRAOPERATIVE CHOLANGIOGRAM N/A 08/10/2017   Procedure: LAPAROSCOPIC CHOLECYSTECTOMY SINGLE SITE WITH INTRAOPERATIVE CHOLANGIOGRAM;  Surgeon: Michael Boston, MD;  [facility];  Service: General;  Laterality: N/A;  . right site perc tube  08/24/2017    There were no vitals filed for this visit.  Subjective Assessment - 10/20/17 0927    Subjective  Did fine after last time, no soreness or excessive fatigue.  "I don't pay attention to the pain in my right side."  Notes a little more energy.      Currently in Pain?  Yes    Pain Score  1     Pain Location  Flank    Pain Orientation  Right                      [facility] Adult PT Treatment/Exercise - 10/20/17 0001      Therapeutic Activites    Therapeutic Activities  ADL's    ADL's  walking, standing, sit to stand, stair climbing      Neuro Re-ed    Neuro Re-ed Details   dynamic balance with narrow base of support and unstable surface and postural correction  Lumbar Exercises: Standing   Side Lunge  -- braiding and crossovers    Other Standing Lumbar Exercises  red ball toss with staggered stand     Other Standing Lumbar Exercises  ladder walk high step and side stepping      Knee/Hip Exercises: Stretches   Other Knee/Hip Stretches  doorway psoas stretch 3x 5 bil      Knee/Hip Exercises: Aerobic   Stationary Bike  L3 6 min    Other Aerobic  UBE 2 min forward, 2 min backward L1      Knee/Hip Exercises: Machines for Strengthening   Total Gym Leg Press  seat 7 Bil 80#, 40# single leg 15x each      Knee/Hip Exercises: Standing   Rebounder  weight shifting side to side, forward/backward, marching 1 min each    Other Standing Knee Exercises  wall snow angels10x, scap retract, neck retraction 2  towels plus pillow             PT Education - 10/20/17 1014    Education provided  Yes    Education Details  wall posture exercises    Person(s) Educated  Patient    Methods  Explanation;Demonstration;Handout    Comprehension  Verbalized understanding;Returned demonstration       PT Short Term Goals - 10/11/17 1410      PT SHORT TERM GOAL #1   Title  The patient will demonstrate understanding of initial HEP for strengthening and endurance    Time  3    Period  Weeks    Status  New    Target Date  11/01/17      PT SHORT TERM GOAL #2   Title  The patient will be able to walk 1300 feet in 6 minutes indicating improved gait speed needed for community ambulation    Time  3    Period  Weeks    Status  New        PT Long Term Goals - 10/11/17 1413      PT LONG TERM GOAL #1   Title  The patient will be independent in safe self progression of HEP and gym program    Time  6    Period  Weeks    Status  New    Target Date  11/22/17      PT LONG TERM GOAL #2   Title  Trunk (core) and LE strength 4+/5 to 5/5 needed for standing and doing furniture refinishing and furniture building     Time  6    Period  Weeks    Status  New      PT LONG TERM GOAL #3   Title  The patient will have BERG balance score to 55/56 indicating near normal static and dynamic balance    Time  6    Period  Weeks    Status  New      PT LONG TERM GOAL #4   Title  Six minute walk test improved to 1400 feet indicating improved gait speed needed for community ambulation    Time  6    Period  Weeks    Status  New            Plan - 10/20/17 1026    Clinical Impression Statement  Should be able to increase weight on leg press to 100# next visit.  Improving with dynamic balance with single leg standing still a challenge.  Frequent cues to hold head upright in standing (  compensates for balance by looking at the floor.)   Demonstrates good compliance with HEP.      Rehab Potential  Good    PT  Frequency  2x / week    PT Duration  6 weeks    PT Treatment/Interventions  ADLs/Self Care Home Management;Neuromuscular re-education;Functional mobility training;Therapeutic exercise;Therapeutic activities;Patient/family education    PT Next Visit Plan  Nu-Step;  leg press 100# bil, 50# single leg;    rebounder;   step ups;  resisted walking;  posture ex       Patient will benefit from skilled therapeutic intervention in order to improve the following deficits and impairments:  Decreased activity tolerance, Decreased endurance, Decreased strength, Impaired perceived functional ability, Difficulty walking  Visit Diagnosis: Muscle weakness (generalized)  Difficulty in walking, not elsewhere classified     Problem List Patient Active Problem List   Diagnosis Date Noted  . Bile leak   . Acute on chronic cholecystitis s/p lap cholecystectomy 08/10/2017 08/10/2017  . Murmur 07/25/2017  . Preop cardiovascular exam 07/25/2017  . Rectal bleeding   . Hemorrhoids, internal, with bleeding   . Hematochezia 05/13/2016  . BPH (benign prostatic hyperplasia) 05/13/2016  . Thrombocytopenia ([place]) 05/13/2016  . Elevated blood pressure reading without diagnosis of hypertension 05/13/2016  . Shingles 09/14/2012  . HLD (hyperlipidemia) 07/25/2007  . SYMPTOM, JOINT NEC, HAND 07/25/2007  . TB SKIN TEST, POSITIVE 07/25/2007  . SYNCOPE, HX OF 07/25/2007   Ruben Im, PT 10/20/17 10:32 AM Phone: [phone] Fax: [phone]   Alvera Singh 10/20/2017, 10:32 AM  [facility] [address], [place], [place], [address] Phone: [phone]   Fax:  [phone]  Name: ZYRION COEY MRN: [id number] Date of Birth: 18-Mar-1941

## 2017-10-20 NOTE — Patient Instructions (Addendum)
         Wall posture exercises:

## 2017-10-25 ENCOUNTER — Encounter: Payer: Self-pay | Admitting: Physical Therapy

## 2017-10-25 ENCOUNTER — Ambulatory Visit: Payer: Medicare Other | Admitting: Physical Therapy

## 2017-10-25 DIAGNOSIS — R262 Difficulty in walking, not elsewhere classified: Secondary | ICD-10-CM | POA: Diagnosis not present

## 2017-10-25 DIAGNOSIS — M6281 Muscle weakness (generalized): Secondary | ICD-10-CM

## 2017-10-25 NOTE — Therapy (Signed)
[facility] [address], [place], [place], [address] Phone: [phone]   Fax:  [phone]  Physical Therapy Treatment  Patient Details  Name: Peter Chandler MRN: [id number] Date of Birth: [birth year] Referring Provider: Dr. Alysia Penna   Encounter Date: 10/25/2017  PT End of Session - 10/25/17 1404    Visit Number  5    Date for PT Re-Evaluation  11/22/17    Authorization Type  Medicare    PT Start Time  3810    PT Stop Time  1440    PT Time Calculation (min)  43 min    Activity Tolerance  Patient tolerated treatment well       Past Medical History:  Diagnosis Date  . Arthritis    "hands" (05/13/2016)  . Basal cell carcinoma    "face, back"  . Bleeding per rectum 05/13/2016  . BPH (benign prostatic hyperplasia) 09/2015  . Cholelithiasis 09/2015   noted on CT done by urology for his gross hematuria w/u--pt was referred to gen surg by Dr. Ralene Muskrat office  . Chronic thrombocytopenic purpura ([place])    [name] 05/13/2016  . Complication of anesthesia    slow to awaken after anesthesia  . Diplopia 2017   left eye.  glasses lens fitted with a prism  . Gross hematuria 09/2015   Painless.  cystoscopy ~ 09/2015, Dr Jeffie Pollock.  Conclusion (per pt) is source of blood is the prostate.    . Hyperlipidemia     Past Surgical History:  Procedure Laterality Date  . COLONOSCOPY  11/05/2002   Internal hemorrhoids, hyperplastic sigmoid polyp, ascending erythema, descending venous lake, hypertrophied papilla. Dr. Aurelio Jew, [place]  . COLONOSCOPY N/A 05/14/2016   per Dr. Carlean Purl, hypertrophied anal papillae but no polyps, no repeats needed   . CYSTOSCOPY  ~ 09/2015   to eval gross hematuria.  Dr Jeffie Pollock at [facility] urology.   . ENDOSCOPIC RETROGRADE CHOLANGIOPANCREATOGRAPHY (ERCP) WITH PROPOFOL N/A 08/30/2017   Procedure: ENDOSCOPIC RETROGRADE CHOLANGIOPANCREATOGRAPHY (ERCP) WITH PROPOFOL;  Surgeon: Ladene Artist, MD;  [facility];  Service: Endoscopy;  Laterality: N/A;  . IR RADIOLOGIST EVAL & MGMT  09/08/2017  . IR RADIOLOGIST EVAL & MGMT  09/20/2017  . IR RADIOLOGIST EVAL & MGMT  10/06/2017  . LAPAROSCOPIC CHOLECYSTECTOMY SINGLE SITE WITH INTRAOPERATIVE CHOLANGIOGRAM N/A 08/10/2017   Procedure: LAPAROSCOPIC CHOLECYSTECTOMY SINGLE SITE WITH INTRAOPERATIVE CHOLANGIOGRAM;  Surgeon: Michael Boston, MD;  [facility];  Service: General;  Laterality: N/A;  . right site perc tube  08/24/2017    There were no vitals filed for this visit.  Subjective Assessment - 10/25/17 1355    Subjective  Did OK last time.  My side hurts every now and then but not much.   Reports he doesn't wear his abdominal binder any more.  Standing up straight against the wall is the hardest thing I've done.      Currently in Pain?  No/denies    Pain Score  0-No pain                      [facility] Adult PT Treatment/Exercise - 10/25/17 0001      Therapeutic Activites    ADL's  walking, standing, sit to stand, stair climbing      Neuro Re-ed    Neuro Re-ed Details   dynamic balance with narrow base of support and unstable surface and postural correction       Lumbar Exercises: Standing  Other Standing Lumbar Exercises  lunge stepping to BOSU forward and side with UE reaching 5x each      Knee/Hip Exercises: Stretches   Other Knee/Hip Stretches  doorway psoas stretch 3x 5 bil      Knee/Hip Exercises: Aerobic   Nustep  L3 10 min    Other Aerobic  UBE 2 min forward, 2 min backward L1      Knee/Hip Exercises: Machines for Strengthening   Total Gym Leg Press  seat 7 95# bil; 50# single leg      Knee/Hip Exercises: Standing   SLS with Vectors  foot prop on BOSU with red band UE diagonals 15x right/left    Other Standing Knee Exercises  hip isometrics abduction and extension 5 sec holds 5x each bil with mirror feedback     Other Standing Knee Exercises  25# resisted wall backward, 20# sidestepping with belt 3-5 x each'       Shoulder Exercises: Standing   Other Standing Exercises  red band horizontal abduction straight and diagonals against wall 3x 10 with mirror feedback                PT Short Term Goals - 10/25/17 1736      PT SHORT TERM GOAL #1   Title  The patient will demonstrate understanding of initial HEP for strengthening and endurance    Status  Achieved      PT SHORT TERM GOAL #2   Title  The patient will be able to walk 1300 feet in 6 minutes indicating improved gait speed needed for community ambulation    Time  3    Period  Weeks    Status  On-going        PT Long Term Goals - 10/11/17 1413      PT LONG TERM GOAL #1   Title  The patient will be independent in safe self progression of HEP and gym program    Time  6    Period  Weeks    Status  New    Target Date  11/22/17      PT LONG TERM GOAL #2   Title  Trunk (core) and LE strength 4+/5 to 5/5 needed for standing and doing furniture refinishing and furniture building     Time  6    Period  Weeks    Status  New      PT LONG TERM GOAL #3   Title  The patient will have BERG balance score to 55/56 indicating near normal static and dynamic balance    Time  6    Period  Weeks    Status  New      PT LONG TERM GOAL #4   Title  Six minute walk test improved to 1400 feet indicating improved gait speed needed for community ambulation    Time  6    Period  Weeks    Status  New            Plan - 10/25/17 1440    Clinical Impression Statement  The patient continues to make steady progress with dynamic balance, endurance and strength.  Patient able to self recover with mild loss of balance.  Should meet majority of goals in 2 more weeks.      Rehab Potential  Good    PT Frequency  2x / week    PT Duration  6 weeks    PT Treatment/Interventions  ADLs/Self Care Home Management;Neuromuscular re-education;Functional mobility  training;Therapeutic exercise;Therapeutic activities;Patient/family education    PT Next  Visit Plan  Nu-Step;  leg press 100# bil, 50# single leg;    rebounder;   step ups;  resisted walking;  posture ex; do 6 min walk test       Patient will benefit from skilled therapeutic intervention in order to improve the following deficits and impairments:  Decreased activity tolerance, Decreased endurance, Decreased strength, Impaired perceived functional ability, Difficulty walking  Visit Diagnosis: Muscle weakness (generalized)  Difficulty in walking, not elsewhere classified     Problem List Patient Active Problem List   Diagnosis Date Noted  . Bile leak   . Acute on chronic cholecystitis s/p lap cholecystectomy 08/10/2017 08/10/2017  . Murmur 07/25/2017  . Preop cardiovascular exam 07/25/2017  . Rectal bleeding   . Hemorrhoids, internal, with bleeding   . Hematochezia 05/13/2016  . BPH (benign prostatic hyperplasia) 05/13/2016  . Thrombocytopenia ([place]) 05/13/2016  . Elevated blood pressure reading without diagnosis of hypertension 05/13/2016  . Shingles 09/14/2012  . HLD (hyperlipidemia) 07/25/2007  . SYMPTOM, JOINT NEC, HAND 07/25/2007  . TB SKIN TEST, POSITIVE 07/25/2007  . SYNCOPE, HX OF 07/25/2007   Ruben Im, PT 10/25/17 5:38 PM Phone: [phone] Fax: [phone]  Alvera Singh 10/25/2017, 5:37 PM  [facility] [address] [place], [place], [address] Phone: [phone]   Fax:  [phone]  Name: Peter Chandler MRN: [id number] Date of Birth: June 29, 1941

## 2017-10-27 ENCOUNTER — Ambulatory Visit: Payer: Medicare Other | Admitting: Physical Therapy

## 2017-10-27 ENCOUNTER — Encounter: Payer: Self-pay | Admitting: Physical Therapy

## 2017-10-27 DIAGNOSIS — R262 Difficulty in walking, not elsewhere classified: Secondary | ICD-10-CM

## 2017-10-27 DIAGNOSIS — M6281 Muscle weakness (generalized): Secondary | ICD-10-CM | POA: Diagnosis not present

## 2017-10-27 NOTE — Therapy (Signed)
[facility] [address] [place], [place], [address] Phone: [phone]   Fax:  [phone]  Physical Therapy Treatment  Patient Details  Name: Peter Chandler MRN: [id number] Date of Birth: Jul 14, 1941 Referring Provider: Dr. Alysia Penna   Encounter Date: 10/27/2017  PT End of Session - 10/27/17 1438    Visit Number  6    Date for PT Re-Evaluation  11/22/17    Authorization Type  Medicare    PT Start Time  1400    PT Stop Time  1442    PT Time Calculation (min)  42 min    Activity Tolerance  Patient tolerated treatment well       Past Medical History:  Diagnosis Date  . Arthritis    "hands" (05/13/2016)  . Basal cell carcinoma    "face, back"  . Bleeding per rectum 05/13/2016  . BPH (benign prostatic hyperplasia) 09/2015  . Cholelithiasis 09/2015   noted on CT done by urology for his gross hematuria w/u--pt was referred to gen surg by Dr. Ralene Muskrat office  . Chronic thrombocytopenic purpura ([place])    [name] 05/13/2016  . Complication of anesthesia    slow to awaken after anesthesia  . Diplopia 2017   left eye.  glasses lens fitted with a prism  . Gross hematuria 09/2015   Painless.  cystoscopy ~ 09/2015, Dr Jeffie Pollock.  Conclusion (per pt) is source of blood is the prostate.    . Hyperlipidemia     Past Surgical History:  Procedure Laterality Date  . COLONOSCOPY  11/05/2002   Internal hemorrhoids, hyperplastic sigmoid polyp, ascending erythema, descending venous lake, hypertrophied papilla. Dr. Aurelio Jew, [place]  . COLONOSCOPY N/A 05/14/2016   per Dr. Carlean Purl, hypertrophied anal papillae but no polyps, no repeats needed   . CYSTOSCOPY  ~ 09/2015   to eval gross hematuria.  Dr Jeffie Pollock at [facility] urology.   . ENDOSCOPIC RETROGRADE CHOLANGIOPANCREATOGRAPHY (ERCP) WITH PROPOFOL N/A 08/30/2017   Procedure: ENDOSCOPIC RETROGRADE CHOLANGIOPANCREATOGRAPHY (ERCP) WITH PROPOFOL;  Surgeon: Ladene Artist, MD;  Location: [facility]  ENDOSCOPY;  Service: Endoscopy;  Laterality: N/A;  . IR RADIOLOGIST EVAL & MGMT  09/08/2017  . IR RADIOLOGIST EVAL & MGMT  09/20/2017  . IR RADIOLOGIST EVAL & MGMT  10/06/2017  . LAPAROSCOPIC CHOLECYSTECTOMY SINGLE SITE WITH INTRAOPERATIVE CHOLANGIOGRAM N/A 08/10/2017   Procedure: LAPAROSCOPIC CHOLECYSTECTOMY SINGLE SITE WITH INTRAOPERATIVE CHOLANGIOGRAM;  Surgeon: Michael Boston, MD;  Location: [facility]S;  Service: General;  Laterality: N/A;  . right site perc tube  08/24/2017    There were no vitals filed for this visit.  Subjective Assessment - 10/27/17 1404    Subjective  I did fine last time.  I walked 20 minutes this morning.      Currently in Pain?  No/denies    Pain Score  0-No pain    Aggravating Factors   not really hurting anymore                      [facility] Adult PT Treatment/Exercise - 10/27/17 0001      Therapeutic Activites    ADL's  walking, standing, sit to stand, stair climbing      Neuro Re-ed    Neuro Re-ed Details   dynamic balance with narrow base of support and unstable surface and postural correction       Knee/Hip Exercises: Stretches   Active Hamstring Stretch  Right;Left on 2nd step    Quad Stretch  Right;Left;5 reps  2nd step      Knee/Hip Exercises: Aerobic   Nustep  L3 10 min    Other Aerobic  UBE 3 min forward, 3 min backward L1.5      Knee/Hip Exercises: Machines for Strengthening   Total Gym Leg Press  seat 7 100# bil 20x; 50# single leg 20x      Knee/Hip Exercises: Standing   Lunge Walking - Round Trips  red band around thighs: sidestep, cowboys, tightrope walk 2 laps each    SLS  floor sliders backward, side and around 10x right/left    Other Standing Knee Exercises  Wall climbs 15x    Other Standing Knee Exercises  narrow base and staggered stand on foam pad with yellow weighted chops 10x  each       Shoulder Exercises: Standing   Other Standing Exercises  2# flexion, scaption and abduction 10x with mirror feedback                 PT Short Term Goals - 10/25/17 1736      PT SHORT TERM GOAL #1   Title  The patient will demonstrate understanding of initial HEP for strengthening and endurance    Status  Achieved      PT SHORT TERM GOAL #2   Title  The patient will be able to walk 1300 feet in 6 minutes indicating improved gait speed needed for community ambulation    Time  3    Period  Weeks    Status  On-going        PT Long Term Goals - 10/11/17 1413      PT LONG TERM GOAL #1   Title  The patient will be independent in safe self progression of HEP and gym program    Time  6    Period  Weeks    Status  New    Target Date  11/22/17      PT LONG TERM GOAL #2   Title  Trunk (core) and LE strength 4+/5 to 5/5 needed for standing and doing furniture refinishing and furniture building     Time  6    Period  Weeks    Status  New      PT LONG TERM GOAL #3   Title  The patient will have BERG balance score to 55/56 indicating near normal static and dynamic balance    Time  6    Period  Weeks    Status  New      PT LONG TERM GOAL #4   Title  Six minute walk test improved to 1400 feet indicating improved gait speed needed for community ambulation    Time  6    Period  Weeks    Status  New            Plan - 10/27/17 1438    Clinical Impression Statement  The patient is progressing well with strength in core, upper and lower body.  Using appropriate ankle strategy to withstand moderate challenges to his balance.   Patient able to self recover with mild losses of balance.  No pain or excessive fatigue.      Rehab Potential  Good    PT Frequency  2x / week    PT Duration  6 weeks    PT Treatment/Interventions  ADLs/Self Care Home Management;Neuromuscular re-education;Functional mobility training;Therapeutic exercise;Therapeutic activities;Patient/family education    PT Next Visit Plan  Nu-Step;  leg press 100# bil, 50# single leg;  rebounder;   step ups;  resisted walking;  posture  ex;  moderate balance challenges       Patient will benefit from skilled therapeutic intervention in order to improve the following deficits and impairments:  Decreased activity tolerance, Decreased endurance, Decreased strength, Impaired perceived functional ability, Difficulty walking  Visit Diagnosis: Muscle weakness (generalized)  Difficulty in walking, not elsewhere classified     Problem List Patient Active Problem List   Diagnosis Date Noted  . Bile leak   . Acute on chronic cholecystitis s/p lap cholecystectomy 08/10/2017 08/10/2017  . Murmur 07/25/2017  . Preop cardiovascular exam 07/25/2017  . Rectal bleeding   . Hemorrhoids, internal, with bleeding   . Hematochezia 05/13/2016  . BPH (benign prostatic hyperplasia) 05/13/2016  . Thrombocytopenia ([place]) 05/13/2016  . Elevated blood pressure reading without diagnosis of hypertension 05/13/2016  . Shingles 09/14/2012  . HLD (hyperlipidemia) 07/25/2007  . SYMPTOM, JOINT NEC, HAND 07/25/2007  . TB SKIN TEST, POSITIVE 07/25/2007  . SYNCOPE, HX OF 07/25/2007   Ruben Im, PT 10/27/17 2:46 PM Phone: [phone] Fax: [phone]  Alvera Singh 10/27/2017, 2:42 PM  [facility] [address] [place], [place], [address] Phone: [phone]   Fax:  [phone]  Name: TORSTEN WENIGER MRN: [id number] Date of Birth: Jul 05, 1941

## 2017-11-01 ENCOUNTER — Ambulatory Visit: Payer: Medicare Other | Admitting: Physical Therapy

## 2017-11-01 ENCOUNTER — Encounter: Payer: Self-pay | Admitting: Physical Therapy

## 2017-11-01 DIAGNOSIS — R262 Difficulty in walking, not elsewhere classified: Secondary | ICD-10-CM | POA: Diagnosis not present

## 2017-11-01 DIAGNOSIS — M6281 Muscle weakness (generalized): Secondary | ICD-10-CM

## 2017-11-01 NOTE — Therapy (Signed)
[facility] [address] [place], [place], [address] Phone: [phone]   Fax:  [phone]  Physical Therapy Treatment  Patient Details  Name: Peter Chandler MRN: [id number] Date of Birth: 1941/08/31 Referring Provider: Dr. Alysia Penna   Encounter Date: 11/01/2017  PT End of Session - 11/01/17 1407    Visit Number  7    Date for PT Re-Evaluation  11/22/17    Authorization Type  Medicare    PT Start Time  1400    PT Stop Time  1440    PT Time Calculation (min)  40 min    Activity Tolerance  Patient tolerated treatment well       Past Medical History:  Diagnosis Date  . Arthritis    "hands" (05/13/2016)  . Basal cell carcinoma    "face, back"  . Bleeding per rectum 05/13/2016  . BPH (benign prostatic hyperplasia) 09/2015  . Cholelithiasis 09/2015   noted on CT done by urology for his gross hematuria w/u--pt was referred to gen surg by Dr. Ralene Muskrat office  . Chronic thrombocytopenic purpura ([place])    [name] 05/13/2016  . Complication of anesthesia    slow to awaken after anesthesia  . Diplopia 2017   left eye.  glasses lens fitted with a prism  . Gross hematuria 09/2015   Painless.  cystoscopy ~ 09/2015, Dr Jeffie Pollock.  Conclusion (per pt) is source of blood is the prostate.    . Hyperlipidemia     Past Surgical History:  Procedure Laterality Date  . COLONOSCOPY  11/05/2002   Internal hemorrhoids, hyperplastic sigmoid polyp, ascending erythema, descending venous lake, hypertrophied papilla. Dr. Aurelio Jew, [place]  . COLONOSCOPY N/A 05/14/2016   per Dr. Carlean Purl, hypertrophied anal papillae but no polyps, no repeats needed   . CYSTOSCOPY  ~ 09/2015   to eval gross hematuria.  Dr Jeffie Pollock at [facility] urology.   . ENDOSCOPIC RETROGRADE CHOLANGIOPANCREATOGRAPHY (ERCP) WITH PROPOFOL N/A 08/30/2017   Procedure: ENDOSCOPIC RETROGRADE CHOLANGIOPANCREATOGRAPHY (ERCP) WITH PROPOFOL;  Surgeon: Ladene Artist, MD;  [facility];  Service: Endoscopy;  Laterality: N/A;  . IR RADIOLOGIST EVAL & MGMT  09/08/2017  . IR RADIOLOGIST EVAL & MGMT  09/20/2017  . IR RADIOLOGIST EVAL & MGMT  10/06/2017  . LAPAROSCOPIC CHOLECYSTECTOMY SINGLE SITE WITH INTRAOPERATIVE CHOLANGIOGRAM N/A 08/10/2017   Procedure: LAPAROSCOPIC CHOLECYSTECTOMY SINGLE SITE WITH INTRAOPERATIVE CHOLANGIOGRAM;  Surgeon: Michael Boston, MD;  [facility];  Service: General;  Laterality: N/A;  . right site perc tube  08/24/2017    There were no vitals filed for this visit.  Subjective Assessment - 11/01/17 1359    Subjective  No complaints.   I'm gaining weight now (which is on).  I eat like a horse.      Currently in Pain?  No/denies    Pain Score  0-No pain         [facility] PT Assessment - 11/01/17 0001      6 Minute Walk- Baseline   6 Minute Walk- Baseline  -- 1260                  [facility] Adult PT Treatment/Exercise - 11/01/17 0001      Lumbar Exercises: Standing   Other Standing Lumbar Exercises  SLS with 1 foot on BOSU with 2# plyo ball toss 10x each side      Knee/Hip Exercises: Stretches   Active Hamstring Stretch  Right;Left on 2nd step    Sports administrator  Right;Left;5 reps 2nd step    Other Knee/Hip Stretches  rocker board calf stretch 1 minute      Knee/Hip Exercises: Aerobic   Nustep  L3 10 min    Other Aerobic  UBE 3 min forward, 3 min backward L2 sitting on green physioball      Knee/Hip Exercises: Machines for Strengthening   Total Gym Leg Press  seat 7 100# bil 20x; 50# single leg 20x      Knee/Hip Exercises: Standing   SLS  floor sliders backward, side and around 10x right/left    Other Standing Knee Exercises  sidestepping, high stepping, [place] 2 laps each      Shoulder Exercises: Standing   Other Standing Exercises  2# flexion, scaption and abduction 10x with mirror feedback                PT Short Term Goals - 11/01/17 1411      PT SHORT TERM GOAL #1   Title  The patient will  demonstrate understanding of initial HEP for strengthening and endurance    Status  Achieved      PT SHORT TERM GOAL #2   Title  The patient will be able to walk 1300 feet in 6 minutes indicating improved gait speed needed for community ambulation    Status  Partially Met        PT Long Term Goals - 10/11/17 1413      PT LONG TERM GOAL #1   Title  The patient will be independent in safe self progression of HEP and gym program    Time  6    Period  Weeks    Status  New    Target Date  11/22/17      PT LONG TERM GOAL #2   Title  Trunk (core) and LE strength 4+/5 to 5/5 needed for standing and doing furniture refinishing and furniture building     Time  6    Period  Weeks    Status  New      PT LONG TERM GOAL #3   Title  The patient will have BERG balance score to 55/56 indicating near normal static and dynamic balance    Time  6    Period  Weeks    Status  New      PT LONG TERM GOAL #4   Title  Six minute walk test improved to 1400 feet indicating improved gait speed needed for community ambulation    Time  6    Period  Weeks    Status  New            Plan - 11/01/17 1410    Clinical Impression Statement  The patient is making steady gains in stamina, core and extremity strength and dynamic balance.  No pain or excessive fatigue.  No episodes of major loss of balance.  Good improvement with 6 min walk test.  Almost 50th percentile for 6 MWT for age and gender.      Rehab Potential  Good    PT Frequency  2x / week    PT Treatment/Interventions  ADLs/Self Care Home Management;Neuromuscular re-education;Functional mobility training;Therapeutic exercise;Therapeutic activities;Patient/family education    PT Next Visit Plan  Nu-Step;  leg press 100# bil, 50# single leg;    rebounder;   step ups;  resisted walking;  posture ex;  moderate balance challenges       Patient will benefit from skilled therapeutic intervention in order to improve  the following deficits and  impairments:  Decreased activity tolerance, Decreased endurance, Decreased strength, Impaired perceived functional ability, Difficulty walking  Visit Diagnosis: Muscle weakness (generalized)  Difficulty in walking, not elsewhere classified     Problem List Patient Active Problem List   Diagnosis Date Noted  . Bile leak   . Acute on chronic cholecystitis s/p lap cholecystectomy 08/10/2017 08/10/2017  . Murmur 07/25/2017  . Preop cardiovascular exam 07/25/2017  . Rectal bleeding   . Hemorrhoids, internal, with bleeding   . Hematochezia 05/13/2016  . BPH (benign prostatic hyperplasia) 05/13/2016  . Thrombocytopenia ([place]) 05/13/2016  . Elevated blood pressure reading without diagnosis of hypertension 05/13/2016  . Shingles 09/14/2012  . HLD (hyperlipidemia) 07/25/2007  . SYMPTOM, JOINT NEC, HAND 07/25/2007  . TB SKIN TEST, POSITIVE 07/25/2007  . SYNCOPE, HX OF 07/25/2007   Ruben Im, PT 11/01/17 2:38 PM Phone: [phone] Fax: [phone]  Alvera Singh 11/01/2017, 2:37 PM  [facility] [address] [place], [place], [address] Phone: [phone]   Fax:  [phone]  Name: Peter Chandler MRN: [id number] Date of Birth: 1941-04-15

## 2017-11-03 ENCOUNTER — Ambulatory Visit: Payer: Medicare Other | Admitting: Physical Therapy

## 2017-11-03 ENCOUNTER — Encounter: Payer: Self-pay | Admitting: Physical Therapy

## 2017-11-03 DIAGNOSIS — M6281 Muscle weakness (generalized): Secondary | ICD-10-CM | POA: Diagnosis not present

## 2017-11-03 DIAGNOSIS — R262 Difficulty in walking, not elsewhere classified: Secondary | ICD-10-CM

## 2017-11-03 NOTE — Therapy (Signed)
[facility] [address] [place], [place], [address] Phone: [phone]   Fax:  [phone]  Physical Therapy Treatment  Patient Details  Name: Peter Chandler MRN: [id number] Date of Birth: [birth year] Referring Provider: Dr. Alysia Penna   Encounter Date: 11/03/2017  PT End of Session - 11/03/17 1405    Visit Number  8    Date for PT Re-Evaluation  11/22/17    Authorization Type  Medicare    PT Start Time  1400    PT Stop Time  1445    PT Time Calculation (min)  45 min    Activity Tolerance  Patient tolerated treatment well       Past Medical History:  Diagnosis Date  . Arthritis    "hands" (05/13/2016)  . Basal cell carcinoma    "face, back"  . Bleeding per rectum 05/13/2016  . BPH (benign prostatic hyperplasia) 09/2015  . Cholelithiasis 09/2015   noted on CT done by urology for his gross hematuria w/u--pt was referred to gen surg by Dr. Ralene Muskrat office  . Chronic thrombocytopenic purpura ([place])    [name] 05/13/2016  . Complication of anesthesia    slow to awaken after anesthesia  . Diplopia 2017   left eye.  glasses lens fitted with a prism  . Gross hematuria 09/2015   Painless.  cystoscopy ~ 09/2015, Dr Jeffie Pollock.  Conclusion (per pt) is source of blood is the prostate.    . Hyperlipidemia     Past Surgical History:  Procedure Laterality Date  . COLONOSCOPY  11/05/2002   Internal hemorrhoids, hyperplastic sigmoid polyp, ascending erythema, descending venous lake, hypertrophied papilla. Dr. Aurelio Jew,   . COLONOSCOPY N/A 05/14/2016   per Dr. Carlean Purl, hypertrophied anal papillae but no polyps, no repeats needed   . CYSTOSCOPY  ~ 09/2015   to eval gross hematuria.  Dr Jeffie Pollock at [facility] urology.   . ENDOSCOPIC RETROGRADE CHOLANGIOPANCREATOGRAPHY (ERCP) WITH PROPOFOL N/A 08/30/2017   Procedure: ENDOSCOPIC RETROGRADE CHOLANGIOPANCREATOGRAPHY (ERCP) WITH PROPOFOL;  Surgeon: Ladene Artist, MD;  [facility];  Service: Endoscopy;  Laterality: N/A;  . IR RADIOLOGIST EVAL & MGMT  09/08/2017  . IR RADIOLOGIST EVAL & MGMT  09/20/2017  . IR RADIOLOGIST EVAL & MGMT  10/06/2017  . LAPAROSCOPIC CHOLECYSTECTOMY SINGLE SITE WITH INTRAOPERATIVE CHOLANGIOGRAM N/A 08/10/2017   Procedure: LAPAROSCOPIC CHOLECYSTECTOMY SINGLE SITE WITH INTRAOPERATIVE CHOLANGIOGRAM;  Surgeon: Michael Boston, MD;  [facility];  Service: General;  Laterality: N/A;  . right site perc tube  08/24/2017    There were no vitals filed for this visit.  Subjective Assessment - 11/03/17 1402    Subjective  I'm feeling fine.      Currently in Pain?  No/denies    Pain Score  0-No pain                      [facility] Adult PT Treatment/Exercise - 11/03/17 0001      Therapeutic Activites    ADL's  walking, standing, sit to stand, stair climbing      Neuro Re-ed    Neuro Re-ed Details   dynamic balance with narrow base of support and unstable surface and postural correction       Knee/Hip Exercises: Stretches   Active Hamstring Stretch  Right;Left on 2nd step    Quad Stretch  Right;Left;5 reps 2nd step    Other Knee/Hip Stretches  rocker board calf stretch 1 minute  Knee/Hip Exercises: Aerobic   Nustep  L3 10 min    Other Aerobic  UBE 3 min forward, 3 min backward L1.5      Knee/Hip Exercises: Machines for Strengthening   Total Gym Leg Press  seat 7 100# bil 20x; 50# single leg 20x      Knee/Hip Exercises: Standing   Lateral Step Up  Right;Left;1 set;10 reps;Step Height: 6"    Forward Step Up  Right;Left;1 set;10 reps;Hand Hold: 0;Step Height: 6"    Lunge Walking - Round Trips  green band around thighs: sidestep, cowboys, tightrope walk 2 laps each    SLS with Vectors  4 way taps in multi directions 10x each leg    Other Standing Knee Exercises  standing on foam with red ball toss with staggered stand multi directions               PT Short Term Goals - 11/03/17 1434      PT SHORT TERM GOAL  #1   Title  The patient will demonstrate understanding of initial HEP for strengthening and endurance    Status  Achieved      PT SHORT TERM GOAL #2   Title  The patient will be able to walk 1300 feet in 6 minutes indicating improved gait speed needed for community ambulation    Status  Partially Met        PT Long Term Goals - 10/11/17 1413      PT LONG TERM GOAL #1   Title  The patient will be independent in safe self progression of HEP and gym program    Time  6    Period  Weeks    Status  New    Target Date  11/22/17      PT LONG TERM GOAL #2   Title  Trunk (core) and LE strength 4+/5 to 5/5 needed for standing and doing furniture refinishing and furniture building     Time  6    Period  Weeks    Status  New      PT LONG TERM GOAL #3   Title  The patient will have BERG balance score to 55/56 indicating near normal static and dynamic balance    Time  6    Period  Weeks    Status  New      PT LONG TERM GOAL #4   Title  Six minute walk test improved to 1400 feet indicating improved gait speed needed for community ambulation    Time  6    Period  Weeks    Status  New            Plan - 11/03/17 1431    Clinical Impression Statement  The patient is progressing very well with strengthening and endurance.  Moderate dynamic balance challenges are still difficult but he is able to self recover with loss of balance.  Anticipate he will meet all rehab goals in 1-2 visits.       Rehab Potential  Good    PT Frequency  2x / week    PT Duration  6 weeks    PT Treatment/Interventions  ADLs/Self Care Home Management;Neuromuscular re-education;Functional mobility training;Therapeutic exercise;Therapeutic activities;Patient/family education    PT Next Visit Plan  Nu-Step;  leg press 100# bil, 50# single leg;    rebounder;   step ups;  resisted walking;  posture ex;  moderate balance challenges;  recheck BERG and possibly 6 MWT.  Patient will benefit from skilled  therapeutic intervention in order to improve the following deficits and impairments:  Decreased activity tolerance, Decreased endurance, Decreased strength, Impaired perceived functional ability, Difficulty walking  Visit Diagnosis: Muscle weakness (generalized)  Difficulty in walking, not elsewhere classified     Problem List Patient Active Problem List   Diagnosis Date Noted  . Bile leak   . Acute on chronic cholecystitis s/p lap cholecystectomy 08/10/2017 08/10/2017  . Murmur 07/25/2017  . Preop cardiovascular exam 07/25/2017  . Rectal bleeding   . Hemorrhoids, internal, with bleeding   . Hematochezia 05/13/2016  . BPH (benign prostatic hyperplasia) 05/13/2016  . Thrombocytopenia ([place]) 05/13/2016  . Elevated blood pressure reading without diagnosis of hypertension 05/13/2016  . Shingles 09/14/2012  . HLD (hyperlipidemia) 07/25/2007  . SYMPTOM, JOINT NEC, HAND 07/25/2007  . TB SKIN TEST, POSITIVE 07/25/2007  . SYNCOPE, HX OF 07/25/2007   Ruben Im, PT 11/03/17 2:36 PM Phone: [phone] Fax: [phone]  Alvera Singh 11/03/2017, 2:36 PM  [facility] [address] [place], [place], [address] Phone: [phone]   Fax:  [phone]  Name: Peter Chandler MRN: [id number] Date of Birth: [birth year]

## 2017-11-08 ENCOUNTER — Encounter: Payer: Self-pay | Admitting: Physical Therapy

## 2017-11-08 ENCOUNTER — Ambulatory Visit: Payer: Medicare Other | Attending: Family Medicine | Admitting: Physical Therapy

## 2017-11-08 DIAGNOSIS — M6281 Muscle weakness (generalized): Secondary | ICD-10-CM | POA: Diagnosis not present

## 2017-11-08 DIAGNOSIS — R262 Difficulty in walking, not elsewhere classified: Secondary | ICD-10-CM | POA: Diagnosis not present

## 2017-11-08 NOTE — Therapy (Signed)
[facility] [address] [place], [place], [address] Phone: [phone]   Fax:  [phone]  Physical Therapy Treatment/Discharge Summary  Patient Details  Name: Peter Chandler MRN: [id number] Date of Birth: 04-Jul-1941 Referring Provider: Dr. Alysia Penna   Encounter Date: 11/08/2017  PT End of Session - 11/08/17 1404    Visit Number  9    Date for PT Re-Evaluation  11/22/17    Authorization Type  Medicare    PT Start Time  1400    PT Stop Time  1445    PT Time Calculation (min)  45 min    Activity Tolerance  Patient tolerated treatment well       Past Medical History:  Diagnosis Date  . Arthritis    "hands" (05/13/2016)  . Basal cell carcinoma    "face, back"  . Bleeding per rectum 05/13/2016  . BPH (benign prostatic hyperplasia) 09/2015  . Cholelithiasis 09/2015   noted on CT done by urology for his gross hematuria w/u--pt was referred to gen surg by Dr. Ralene Muskrat office  . Chronic thrombocytopenic purpura ([place])    [name] 05/13/2016  . Complication of anesthesia    slow to awaken after anesthesia  . Diplopia 2017   left eye.  glasses lens fitted with a prism  . Gross hematuria 09/2015   Painless.  cystoscopy ~ 09/2015, Dr Jeffie Pollock.  Conclusion (per pt) is source of blood is the prostate.    . Hyperlipidemia     Past Surgical History:  Procedure Laterality Date  . COLONOSCOPY  11/05/2002   Internal hemorrhoids, hyperplastic sigmoid polyp, ascending erythema, descending venous lake, hypertrophied papilla. Dr. Aurelio Jew, [place]  . COLONOSCOPY N/A 05/14/2016   per Dr. Carlean Purl, hypertrophied anal papillae but no polyps, no repeats needed   . CYSTOSCOPY  ~ 09/2015   to eval gross hematuria.  Dr Jeffie Pollock at [facility] urology.   . ENDOSCOPIC RETROGRADE CHOLANGIOPANCREATOGRAPHY (ERCP) WITH PROPOFOL N/A 08/30/2017   Procedure: ENDOSCOPIC RETROGRADE CHOLANGIOPANCREATOGRAPHY (ERCP) WITH PROPOFOL;  Surgeon: Ladene Artist,  MD;  Location: [facility];  Service: Endoscopy;  Laterality: N/A;  . IR RADIOLOGIST EVAL & MGMT  09/08/2017  . IR RADIOLOGIST EVAL & MGMT  09/20/2017  . IR RADIOLOGIST EVAL & MGMT  10/06/2017  . LAPAROSCOPIC CHOLECYSTECTOMY SINGLE SITE WITH INTRAOPERATIVE CHOLANGIOGRAM N/A 08/10/2017   Procedure: LAPAROSCOPIC CHOLECYSTECTOMY SINGLE SITE WITH INTRAOPERATIVE CHOLANGIOGRAM;  Surgeon: Michael Boston, MD;  Location: [facility];  Service: General;  Laterality: N/A;  . right site perc tube  08/24/2017    There were no vitals filed for this visit.  Subjective Assessment - 11/08/17 1402    Subjective  Feeling pretty good.  I do get tired if I do too much.  Walked 20 minutes yesterday without difficulty.      Currently in Pain?  No/denies    Pain Score  0-No pain         [facility] PT Assessment - 11/08/17 0001      Strength   Strength Assessment Site  -- Grossly 5/5 trunk, LEs, UEs      Flexibility   Soft Tissue Assessment /Muscle Length  -- decreased HS length bil;  improved hip flexor length bil      6 Minute Walk- Baseline   6 Minute Walk- Baseline  -- 1600      Berg Balance Test   Sit to Stand  Able to stand without using hands and stabilize independently    Standing Unsupported  Able to stand safely 2 minutes    Sitting with Back Unsupported but Feet Supported on Floor or Stool  Able to sit safely and securely 2 minutes    Stand to Sit  Sits safely with minimal use of hands    Transfers  Able to transfer safely, minor use of hands    Standing Unsupported with Eyes Closed  Able to stand 10 seconds safely    Standing Ubsupported with Feet Together  Able to place feet together independently and stand 1 minute safely    From Standing, Reach Forward with Outstretched Arm  Can reach confidently >25 cm (10")    From Standing Position, Pick up Object from Floor  Able to pick up shoe safely and easily    From Standing Position, Turn to Look Behind Over each Shoulder  Looks behind from both sides and  weight shifts well    Turn 360 Degrees  Able to turn 360 degrees safely in 4 seconds or less    Standing Unsupported, Alternately Place Feet on Step/Stool  Able to stand independently and safely and complete 8 steps in 20 seconds    Standing Unsupported, One Foot in Front  Able to place foot tandem independently and hold 30 seconds    Standing on One Leg  Able to lift leg independently and hold 5-10 seconds    Total Score  55      Timed Up and Go Test   Normal TUG (seconds)  --                  [facility] Adult PT Treatment/Exercise - 11/08/17 0001      Therapeutic Activites    ADL's  walking, standing, sit to stand, stair climbing      Neuro Re-ed    Neuro Re-ed Details   dynamic balance with narrow base of support and unstable surface and postural correction;  SLS       Knee/Hip Exercises: Stretches   Passive Hamstring Stretch  Right;Left;2 reps;30 seconds with strap      Knee/Hip Exercises: Aerobic   Nustep  L3 10 min      Knee/Hip Exercises: Standing   Other Standing Knee Exercises  Discussion of safe self progression of HEP               PT Short Term Goals - 11/08/17 1408      PT SHORT TERM GOAL #1   Title  The patient will demonstrate understanding of initial HEP for strengthening and endurance    Status  Achieved      PT SHORT TERM GOAL #2   Title  The patient will be able to walk 1300 feet in 6 minutes indicating improved gait speed needed for community ambulation    Status  Achieved        PT Long Term Goals - 11/08/17 1410      PT LONG TERM GOAL #1   Title  The patient will be independent in safe self progression of HEP and gym program    Status  Achieved      PT LONG TERM GOAL #2   Title  Trunk (core) and LE strength 4+/5 to 5/5 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  His endurance, strength and balance have improved significantly since the start of care.  He has met all STGs and LTGs.  Will discharge from PT at this time to an independent HEP.         Patient will benefit from skilled therapeutic intervention in order to improve the following deficits and impairments:  Decreased activity tolerance, Decreased endurance, Decreased strength, Impaired perceived functional ability, Difficulty walking  Visit Diagnosis: Muscle weakness (generalized)  Difficulty in walking, not elsewhere classified  PHYSICAL THERAPY DISCHARGE SUMMARY  Visits from Start of Care: 9  Current functional level related to goals / functional outcomes: See clinical impressions above   Remaining deficits: As above   Education / Equipment: Independent HEP Plan: Patient agrees to discharge.  Patient goals were met. Patient is being discharged due to meeting the stated rehab goals.  ?????          Problem List Patient Active Problem List   Diagnosis Date Noted  . Bile leak   . Acute on chronic cholecystitis s/p lap cholecystectomy 08/10/2017 08/10/2017  . Murmur 07/25/2017  . Preop cardiovascular exam 07/25/2017  . Rectal bleeding   . Hemorrhoids, internal, with bleeding   . Hematochezia 05/13/2016  . BPH (benign prostatic hyperplasia) 05/13/2016  . Thrombocytopenia ([place]) 05/13/2016  . Elevated blood pressure reading without diagnosis of hypertension 05/13/2016  . Shingles 09/14/2012  . HLD (hyperlipidemia) 07/25/2007  . SYMPTOM, JOINT NEC, HAND 07/25/2007  . TB SKIN TEST, POSITIVE 07/25/2007  . SYNCOPE, HX OF 07/25/2007    Alvera Singh 11/08/2017, 5:16 PM  [facility] [address] [place], [place], [address] Phone: [phone]   Fax:  [phone]  Name: Peter Chandler MRN: [id number] Date of Birth: 07/09/1941

## 2017-11-10 ENCOUNTER — Encounter: Payer: Medicare Other | Admitting: Physical Therapy

## 2017-11-17 DIAGNOSIS — H6123 Impacted cerumen, bilateral: Secondary | ICD-10-CM | POA: Diagnosis not present

## 2017-11-18 ENCOUNTER — Ambulatory Visit (INDEPENDENT_AMBULATORY_CARE_PROVIDER_SITE_OTHER): Payer: Medicare Other | Admitting: Family Medicine

## 2017-11-18 ENCOUNTER — Encounter: Payer: Self-pay | Admitting: Family Medicine

## 2017-11-18 VITALS — BP 138/82 | HR 92 | Temp 98.8°F | Ht 68.0 in | Wt 157.0 lb

## 2017-11-18 DIAGNOSIS — J209 Acute bronchitis, unspecified: Secondary | ICD-10-CM

## 2017-11-18 MED ORDER — AZITHROMYCIN 250 MG PO TABS: ORAL_TABLET | ORAL | 0 refills | Status: DC

## 2017-11-18 NOTE — Progress Notes (Signed)
   Subjective:    Patient ID: Peter Chandler, male    DOB: June 12, 1941, 77 y.o.   MRN: [id number]  HPI Here for 5 days of stuffy head, PND, ST, chest tightness and a dry cough. He has had fevers to 99 degrees.    Review of Systems  Constitutional: Positive for fever.  HENT: Positive for congestion, postnasal drip and sore throat. Negative for sinus pressure and sinus pain.   Eyes: Negative.   Respiratory: Positive for cough and chest tightness.        Objective:   Physical Exam  Constitutional: He appears well-developed and well-nourished.  HENT:  Right Ear: External ear normal.  Left Ear: External ear normal.  Nose: Nose normal.  Mouth/Throat: Oropharynx is clear and moist.  Eyes: Conjunctivae are normal.  Neck: No thyromegaly present.  Pulmonary/Chest: Effort normal. No respiratory distress. He has no wheezes. He has no rales.  Scattered rhonchi   Lymphadenopathy:    He has no cervical adenopathy.          Assessment & Plan:  Bronchitis, treat with a Zpack. Alysia Penna, MD

## 2017-12-12 DIAGNOSIS — H4912 Fourth [trochlear] nerve palsy, left eye: Secondary | ICD-10-CM | POA: Diagnosis not present

## 2017-12-12 DIAGNOSIS — H2513 Age-related nuclear cataract, bilateral: Secondary | ICD-10-CM | POA: Diagnosis not present

## 2017-12-14 DIAGNOSIS — K8689 Other specified diseases of pancreas: Secondary | ICD-10-CM | POA: Diagnosis not present

## 2017-12-14 DIAGNOSIS — K838 Other specified diseases of biliary tract: Secondary | ICD-10-CM | POA: Diagnosis not present

## 2017-12-14 DIAGNOSIS — Z4689 Encounter for fitting and adjustment of other specified devices: Secondary | ICD-10-CM | POA: Diagnosis not present

## 2017-12-14 DIAGNOSIS — Z8719 Personal history of other diseases of the digestive system: Secondary | ICD-10-CM | POA: Diagnosis not present

## 2017-12-14 DIAGNOSIS — K9189 Other postprocedural complications and disorders of digestive system: Secondary | ICD-10-CM | POA: Diagnosis not present

## 2018-01-04 DIAGNOSIS — N5311 Retarded ejaculation: Secondary | ICD-10-CM | POA: Diagnosis not present

## 2018-03-20 ENCOUNTER — Telehealth: Payer: Self-pay | Admitting: Cardiology

## 2018-03-20 NOTE — Telephone Encounter (Signed)
Need to know if pt need to be pre med before dental treatments. Please advise

## 2018-03-20 NOTE — Telephone Encounter (Signed)
Per MD note 07/2017:  ABNORMAL HEART VALVE:  I see and hear no evidence of this.  No further testing is indicated. No SBE prophylaxis is needed.  Returned call to [employer] with [facility]. Explained MD note. She requested this info be faxed to her at [phone] so it can be placed in patient's file.

## 2018-05-03 ENCOUNTER — Other Ambulatory Visit: Payer: Self-pay

## 2018-08-01 ENCOUNTER — Encounter: Payer: Self-pay | Admitting: Family Medicine

## 2018-08-01 ENCOUNTER — Ambulatory Visit (INDEPENDENT_AMBULATORY_CARE_PROVIDER_SITE_OTHER): Payer: Medicare Other | Admitting: Family Medicine

## 2018-08-01 VITALS — BP 110/70 | HR 55 | Temp 97.4°F | Ht 68.0 in | Wt 168.8 lb

## 2018-08-01 DIAGNOSIS — J029 Acute pharyngitis, unspecified: Secondary | ICD-10-CM

## 2018-08-01 LAB — POCT RAPID STREP A (OFFICE): Rapid Strep A Screen: NEGATIVE

## 2018-08-01 LAB — POC INFLUENZA A&B (BINAX/QUICKVUE)
Influenza A, POC: NEGATIVE
Influenza B, POC: NEGATIVE

## 2018-08-01 NOTE — Addendum Note (Signed)
Addended by: Agnes Lawrence on: 08/01/2018 04:30 PM   Modules accepted: Orders

## 2018-08-01 NOTE — Patient Instructions (Signed)
INSTRUCTIONS FOR UPPER RESPIRATORY INFECTION:  -plenty of rest and fluids  -nasal saline wash 2-3 times daily (use prepackaged nasal saline or bottled/distilled water if making your own)   -can use tylenol (in no history of liver disease) or ibuprofen (if no history of kidney disease, bowel bleeding or significant heart disease) as directed for aches and sorethroat  -in the winter time, using a humidifier at night is helpful (please follow cleaning instructions)  -if you are taking a cough medication - use only as directed, may also try a teaspoon of honey to coat the throat and throat lozenges.   -for sore throat, salt water gargles can help  -follow up if you have fevers, facial pain, tooth pain, difficulty breathing or are worsening or symptoms persist longer then expected  Upper Respiratory Infection, Adult An upper respiratory infection (URI) is also known as the common cold. It is often caused by a type of germ (virus). Colds are easily spread (contagious). You can pass it to others by kissing, coughing, sneezing, or drinking out of the same glass. Usually, you get better in 1 to 3  weeks.  However, the cough can last for even longer. HOME CARE   Only take medicine as told by your doctor. Follow instructions provided above.  Drink enough water and fluids to keep your pee (urine) clear or pale yellow.  Get plenty of rest.  Return to work when your temperature is < 100 for 24 hours or as told by your doctor. You may use a face mask and wash your hands to stop your cold from spreading. GET HELP RIGHT AWAY IF:   After the first few days, you feel you are getting worse.  You have questions about your medicine.  You have chills, shortness of breath, or red spit (mucus).  You have pain in the face for more then 1-2 days, especially when you bend forward.  You have a fever, puffy (swollen) neck, pain when you swallow, or white spots in the back of your throat.  You have a bad  headache, ear pain, sinus pain, or chest pain.  You have a high-pitched whistling sound when you breathe in and out (wheezing).  You cough up blood.  You have sore muscles or a stiff neck. MAKE SURE YOU:   Understand these instructions.  Will watch your condition.  Will get help right away if you are not doing well or get worse. Document Released: 03/08/2008 Document Revised: 12/13/2011 Document Reviewed: 12/26/2013 [facility] Patient Information 2015 [place], [place]. This information is not intended to replace advice given to you by your health care provider. Make sure you discuss any questions you have with your health care provider.

## 2018-08-01 NOTE — Progress Notes (Signed)
HPI:  Using dictation device. Unfortunately this device frequently misinterprets words/phrases.   Acute visit for respiratory illness: -started:2-3 days ago, mild sore throat a week ago then resolved the next day -symptoms:nasal congestion, pnd, scratchy and very sore throat, felt chilled a few times -denies:fever, SOB, cough, body aches, NVD, tooth pain -has tried: nothing -sick contacts/travel/risks: no reported flu, strep or tick exposure -Hx of: wants to check for flu/strep; does not want to take tamiflu -reports PCP usually gives him zpack for this ROS: See pertinent positives and negatives per HPI.  Past Medical History:  Diagnosis Date  . Arthritis    "hands" (05/13/2016)  . Basal cell carcinoma    "face, back"  . Bleeding per rectum 05/13/2016  . BPH (benign prostatic hyperplasia) 09/2015  . Cholelithiasis 09/2015   noted on CT done by urology for his gross hematuria w/u--pt was referred to gen surg by Dr. Ralene Muskrat office  . Chronic thrombocytopenic purpura ([place])    [name] 05/13/2016  . Complication of anesthesia    slow to awaken after anesthesia  . Diplopia 2017   left eye.  glasses lens fitted with a prism  . Gross hematuria 09/2015   Painless.  cystoscopy ~ 09/2015, Dr Jeffie Pollock.  Conclusion (per pt) is source of blood is the prostate.    . Hyperlipidemia     Past Surgical History:  Procedure Laterality Date  . COLONOSCOPY  11/05/2002   Internal hemorrhoids, hyperplastic sigmoid polyp, ascending erythema, descending venous lake, hypertrophied papilla. Dr. Aurelio Jew, [place]  . COLONOSCOPY N/A 05/14/2016   per Dr. Carlean Purl, hypertrophied anal papillae but no polyps, no repeats needed   . CYSTOSCOPY  ~ 09/2015   to eval gross hematuria.  Dr Jeffie Pollock at [facility] urology.   . ENDOSCOPIC RETROGRADE CHOLANGIOPANCREATOGRAPHY (ERCP) WITH PROPOFOL N/A 08/30/2017   Procedure: ENDOSCOPIC RETROGRADE CHOLANGIOPANCREATOGRAPHY (ERCP) WITH PROPOFOL;  Surgeon: Ladene Artist, MD;   [facility];  Service: Endoscopy;  Laterality: N/A;  . IR RADIOLOGIST EVAL & MGMT  09/08/2017  . IR RADIOLOGIST EVAL & MGMT  09/20/2017  . IR RADIOLOGIST EVAL & MGMT  10/06/2017  . LAPAROSCOPIC CHOLECYSTECTOMY SINGLE SITE WITH INTRAOPERATIVE CHOLANGIOGRAM N/A 08/10/2017   Procedure: LAPAROSCOPIC CHOLECYSTECTOMY SINGLE SITE WITH INTRAOPERATIVE CHOLANGIOGRAM;  Surgeon: Michael Boston, MD;  Location: [facility];  Service: General;  Laterality: N/A;  . right site perc tube  08/24/2017    Family History  Problem Relation Age of Onset  . Ovarian cancer Unknown   . Heart disease Unknown   . Heart disease Father 33       Died age 29    Social History   Socioeconomic History  . Marital status: Married    Spouse name: Not on file  . Number of children: Not on file  . Years of education: Not on file  . Highest education level: Not on file  Occupational History  . Not on file  Social Needs  . Financial resource strain: Not on file  . Food insecurity:    Worry: Not on file    Inability: Not on file  . Transportation needs:    Medical: Not on file    Non-medical: Not on file  Tobacco Use  . Smoking status: Never Smoker  . Smokeless tobacco: Never Used  Substance and Sexual Activity  . Alcohol use: No    Alcohol/week: 0.0 standard drinks  . Drug use: No  . Sexual activity: Yes  Lifestyle  . Physical activity:    Days per  week: Not on file    Minutes per session: Not on file  . Stress: Not on file  Relationships  . Social connections:    Talks on phone: Not on file    Gets together: Not on file    Attends religious service: Not on file    Active member of club or organization: Not on file    Attends meetings of clubs or organizations: Not on file    Relationship status: Not on file  Other Topics Concern  . 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      No current outpatient medications on file.  EXAM:  Vitals:   08/01/18 1554    BP: 110/70  Pulse: (!) 55  Temp: (!) 97.4 F (36.3 C)    Body mass index is 25.67 kg/m.  GENERAL: vitals reviewed and listed above, alert, oriented, appears well hydrated and in no acute distress  HEENT: atraumatic, conjunttiva clear, no obvious abnormalities on inspection of external nose and ears, normal appearance of ear canals and TMs, clear nasal congestion, mild post oropharyngeal erythema with PND, no tonsillar edema or exudate, no sinus TTP  NECK: no obvious masses on inspection  LUNGS: clear to auscultation bilaterally, no wheezes, rales or rhonchi, good air movement  CV: HRRR, no peripheral edema  MS: moves all extremities without noticeable abnormality  PSYCH: pleasant and cooperative, no obvious depression or anxiety  ASSESSMENT AND PLAN:  Discussed the following assessment and plan:  No diagnosis found.  -given HPI and exam findings today, a serious infection or illness is unlikely. We discussed potential etiologies, with VURI being most likely, and advised supportive care and monitoring. We discussed treatment side effects, likely course, antibiotic misuse, transmission, and signs of developing a serious illness. -rapid flu/strep testing neg -he does not want to take tamiflu even if has flu -advised flu shot when well per his preference -of course, we advised to return or notify a doctor immediately if symptoms worsen or persist or new concerns arise.    Patient Instructions  INSTRUCTIONS FOR UPPER RESPIRATORY INFECTION:  -plenty of rest and fluids  -nasal saline wash 2-3 times daily (use prepackaged nasal saline or bottled/distilled water if making your own)   -can use tylenol (in no history of liver disease) or ibuprofen (if no history of kidney disease, bowel bleeding or significant heart disease) as directed for aches and sorethroat  -in the winter time, using a humidifier at night is helpful (please follow cleaning instructions)  -if you are taking  a cough medication - use only as directed, may also try a teaspoon of honey to coat the throat and throat lozenges.   -for sore throat, salt water gargles can help  -follow up if you have fevers, facial pain, tooth pain, difficulty breathing or are worsening or symptoms persist longer then expected  Upper Respiratory Infection, Adult An upper respiratory infection (URI) is also known as the common cold. It is often caused by a type of germ (virus). Colds are easily spread (contagious). You can pass it to others by kissing, coughing, sneezing, or drinking out of the same glass. Usually, you get better in 1 to 3  weeks.  However, the cough can last for even longer. HOME CARE   Only take medicine as told by your doctor. Follow instructions provided above.  Drink enough water and fluids to keep your pee (urine) clear or pale yellow.  Get plenty of rest.  Return to work when your temperature is < 100 for 24 hours or as told by your doctor. You may use a face mask and wash your hands to stop your cold from spreading. GET HELP RIGHT AWAY IF:   After the first few days, you feel you are getting worse.  You have questions about your medicine.  You have chills, shortness of breath, or red spit (mucus).  You have pain in the face for more then 1-2 days, especially when you bend forward.  You have a fever, puffy (swollen) neck, pain when you swallow, or white spots in the back of your throat.  You have a bad headache, ear pain, sinus pain, or chest pain.  You have a high-pitched whistling sound when you breathe in and out (wheezing).  You cough up blood.  You have sore muscles or a stiff neck. MAKE SURE YOU:   Understand these instructions.  Will watch your condition.  Will get help right away if you are not doing well or get worse. Document Released: 03/08/2008 Document Revised: 12/13/2011 Document Reviewed: 12/26/2013 [facility] Patient Information 2015 [place], [place]. This  information is not intended to replace advice given to you by your health care provider. Make sure you discuss any questions you have with your health care provider.    Lucretia Kern, DO

## 2018-08-07 DIAGNOSIS — H6123 Impacted cerumen, bilateral: Secondary | ICD-10-CM | POA: Diagnosis not present

## 2018-08-23 ENCOUNTER — Other Ambulatory Visit: Payer: Self-pay

## 2018-09-18 ENCOUNTER — Ambulatory Visit (INDEPENDENT_AMBULATORY_CARE_PROVIDER_SITE_OTHER): Payer: Medicare Other | Admitting: Family Medicine

## 2018-09-18 ENCOUNTER — Encounter: Payer: Self-pay | Admitting: Family Medicine

## 2018-09-18 VITALS — BP 140/82 | HR 65 | Temp 98.2°F | Wt 170.4 lb

## 2018-09-18 DIAGNOSIS — J209 Acute bronchitis, unspecified: Secondary | ICD-10-CM | POA: Diagnosis not present

## 2018-09-18 MED ORDER — AZITHROMYCIN 250 MG PO TABS: ORAL_TABLET | ORAL | 0 refills | Status: DC

## 2018-09-18 NOTE — Progress Notes (Signed)
   Subjective:    Patient ID: Peter Chandler, male    DOB: Aug 29, 1941, 77 y.o.   MRN: [id number]  HPI Here for 5 days of stuffy head, PND, chest congestion and a dry cough.    Review of Systems  Constitutional: Negative.   HENT: Positive for congestion and postnasal drip. Negative for sinus pressure, sinus pain and sore throat.   Eyes: Negative.   Respiratory: Positive for cough and chest tightness.        Objective:   Physical Exam Constitutional:      Appearance: Normal appearance.  HENT:     Right Ear: Tympanic membrane and ear canal normal.     Left Ear: Tympanic membrane and ear canal normal.     Nose: Nose normal.     Mouth/Throat:     Pharynx: Oropharynx is clear.  Eyes:     Conjunctiva/sclera: Conjunctivae normal.  Pulmonary:     Effort: Pulmonary effort is normal. No respiratory distress.     Breath sounds: No stridor. Rhonchi present. No wheezing or rales.  Neurological:     Mental Status: He is alert.           Assessment & Plan:  Bronchitis, treat with a Zpack.  Alysia Penna, MD

## 2018-10-19 DIAGNOSIS — D1801 Hemangioma of skin and subcutaneous tissue: Secondary | ICD-10-CM | POA: Diagnosis not present

## 2018-10-19 DIAGNOSIS — L578 Other skin changes due to chronic exposure to nonionizing radiation: Secondary | ICD-10-CM | POA: Diagnosis not present

## 2018-10-19 DIAGNOSIS — L821 Other seborrheic keratosis: Secondary | ICD-10-CM | POA: Diagnosis not present

## 2018-10-19 DIAGNOSIS — L111 Transient acantholytic dermatosis [Grover]: Secondary | ICD-10-CM | POA: Diagnosis not present

## 2018-10-19 DIAGNOSIS — Z85828 Personal history of other malignant neoplasm of skin: Secondary | ICD-10-CM | POA: Diagnosis not present

## 2018-10-19 DIAGNOSIS — D2310 Other benign neoplasm of skin of unspecified eyelid, including canthus: Secondary | ICD-10-CM | POA: Diagnosis not present

## 2018-11-28 ENCOUNTER — Ambulatory Visit (INDEPENDENT_AMBULATORY_CARE_PROVIDER_SITE_OTHER): Payer: Medicare Other | Admitting: *Deleted

## 2018-11-28 DIAGNOSIS — Z23 Encounter for immunization: Secondary | ICD-10-CM

## 2019-02-28 ENCOUNTER — Ambulatory Visit (INDEPENDENT_AMBULATORY_CARE_PROVIDER_SITE_OTHER): Payer: Medicare Other | Admitting: Family Medicine

## 2019-02-28 ENCOUNTER — Encounter: Payer: Self-pay | Admitting: Family Medicine

## 2019-02-28 ENCOUNTER — Other Ambulatory Visit: Payer: Self-pay

## 2019-02-28 ENCOUNTER — Telehealth: Payer: Self-pay

## 2019-02-28 ENCOUNTER — Other Ambulatory Visit: Payer: Medicare Other

## 2019-02-28 DIAGNOSIS — Z209 Contact with and (suspected) exposure to unspecified communicable disease: Secondary | ICD-10-CM

## 2019-02-28 DIAGNOSIS — R6889 Other general symptoms and signs: Secondary | ICD-10-CM | POA: Diagnosis not present

## 2019-02-28 DIAGNOSIS — Z20822 Contact with and (suspected) exposure to covid-19: Secondary | ICD-10-CM

## 2019-02-28 DIAGNOSIS — Z20828 Contact with and (suspected) exposure to other viral communicable diseases: Secondary | ICD-10-CM | POA: Diagnosis not present

## 2019-02-28 NOTE — Telephone Encounter (Signed)
Call placed to pt.  Spoke with pt's wife. (on DPR)  Advised of referral from Dr. Sarajane Jews for scheduling COVID 19 test.  Sched. Appt. At 10:45 AM today at [facility] site.  Advised that pt. should wear a mask and remain in the car, and follow instructions by the staff.  Verb. Understanding.

## 2019-02-28 NOTE — Telephone Encounter (Signed)
-----   Message from Elie Confer, [place] sent at 02/28/2019  9:18 AM EDT ----- Dr. Sarajane Jews would like to have this patient tested for Covid 19.  His wife was tested prior to her heart ablation and tested positive.  Thank you!!

## 2019-02-28 NOTE — Progress Notes (Signed)
Subjective:    Patient ID: Peter Chandler, male    DOB: December 03, 1940, 78 y.o.   MRN: [id number]  HPI Here to ask about testing for the Covid-19 virus. He feels fine and has no symptoms at all, however his wife tested positive for the virus last week as part of routine testing prior to a planned cardiac ablation procedure. His wife has no symptoms either, but of course the procedure was postponed and she was told to quarantine at home for 14 days.  Virtual Visit via Video Note  I connected with the patient on 02/28/19 at  8:15 AM EDT by a video enabled telemedicine application and verified that I am speaking with the correct person using two identifiers.  Location patient: home Location provider:work or home office Persons participating in the virtual visit: patient, provider  I discussed the limitations of evaluation and management by telemedicine and the availability of in person appointments. The patient expressed understanding and agreed to proceed.   HPI:    ROS: See pertinent positives and negatives per HPI.  Past Medical History:  Diagnosis Date  . Arthritis    "hands" (05/13/2016)  . Basal cell carcinoma    "face, back"  . Bleeding per rectum 05/13/2016  . BPH (benign prostatic hyperplasia) 09/2015  . Cholelithiasis 09/2015   noted on CT done by urology for his gross hematuria w/u--pt was referred to gen surg by Dr. Ralene [place] office  . Chronic thrombocytopenic purpura ([place])    [name] 05/13/2016  . Complication of anesthesia    slow to awaken after anesthesia  . Diplopia 2017   left eye.  glasses lens fitted with a prism  . Gross hematuria 09/2015   Painless.  cystoscopy ~ 09/2015, Dr Jeffie Pollock.  Conclusion (per pt) is source of blood is the prostate.    . Hyperlipidemia     Past Surgical History:  Procedure Laterality Date  . COLONOSCOPY  11/05/2002   Internal hemorrhoids, hyperplastic sigmoid polyp, ascending erythema, descending venous lake, hypertrophied papilla. Dr.  Aurelio Jew, [place]  . COLONOSCOPY N/A 05/14/2016   per Dr. Carlean Purl, hypertrophied anal papillae but no polyps, no repeats needed   . CYSTOSCOPY  ~ 09/2015   to eval gross hematuria.  Dr Jeffie Pollock at [facility].   . ENDOSCOPIC RETROGRADE CHOLANGIOPANCREATOGRAPHY (ERCP) WITH PROPOFOL N/A 08/30/2017   Procedure: ENDOSCOPIC RETROGRADE CHOLANGIOPANCREATOGRAPHY (ERCP) WITH PROPOFOL;  Surgeon: Ladene Artist, MD;  Location: [facility];  Service: Endoscopy;  Laterality: N/A;  . IR RADIOLOGIST EVAL & MGMT  09/08/2017  . IR RADIOLOGIST EVAL & MGMT  09/20/2017  . IR RADIOLOGIST EVAL & MGMT  10/06/2017  . LAPAROSCOPIC CHOLECYSTECTOMY SINGLE SITE WITH INTRAOPERATIVE CHOLANGIOGRAM N/A 08/10/2017   Procedure: LAPAROSCOPIC CHOLECYSTECTOMY SINGLE SITE WITH INTRAOPERATIVE CHOLANGIOGRAM;  Surgeon: Michael Boston, MD;  Location: [facility];  Service: General;  Laterality: N/A;  . right site perc tube  08/24/2017    Family History  Problem Relation Age of Onset  . Ovarian cancer Unknown   . Heart disease Unknown   . Heart disease Father 68       Died age 17     Current Outpatient Medications:  .  azithromycin (ZITHROMAX) 250 MG tablet, As directed, Disp: 6 tablet, Rfl: 0  EXAM:  VITALS per patient if applicable:  GENERAL: alert, oriented, appears well and in no acute distress  HEENT: atraumatic, conjunttiva clear, no obvious abnormalities on inspection of external nose and ears  NECK: normal movements of the head and neck  LUNGS: on inspection no signs of respiratory distress, breathing rate appears normal, no obvious gross SOB, gasping or wheezing  CV: no obvious cyanosis  MS: moves all visible extremities without noticeable abnormality  PSYCH/NEURO: pleasant and cooperative, no obvious depression or anxiety, speech and thought processing grossly intact  ASSESSMENT AND PLAN: Exposure to Covid-19. We will arrange for Peter Chandler to be tested as well. Regardless of the result I advised him to quarantine at  home for a total of 14 days, and he agreed.  Alysia Penna, MD  Discussed the following assessment and plan:  No diagnosis found.     I discussed the assessment and treatment plan with the patient. The patient was provided an opportunity to ask questions and all were answered. The patient agreed with the plan and demonstrated an understanding of the instructions.   The patient was advised to call back or seek an in-person evaluation if the symptoms worsen or if the condition fails to improve as anticipated.    Review of Systems     Objective:   Physical Exam        Assessment & Plan:

## 2019-02-28 NOTE — Progress Notes (Signed)
   Subjective:    Patient ID: Peter Chandler, male    DOB: 10-Dec-1940, 78 y.o.   MRN: [id number]  HPI    Review of Systems     Objective:   Physical Exam        Assessment & Plan:

## 2019-03-02 LAB — NOVEL CORONAVIRUS, NAA: SARS-CoV-2, NAA: NOT DETECTED

## 2019-03-13 ENCOUNTER — Encounter: Payer: Self-pay | Admitting: *Deleted

## 2019-03-30 DIAGNOSIS — H4912 Fourth [trochlear] nerve palsy, left eye: Secondary | ICD-10-CM | POA: Diagnosis not present

## 2019-03-30 DIAGNOSIS — H2513 Age-related nuclear cataract, bilateral: Secondary | ICD-10-CM | POA: Diagnosis not present

## 2019-05-17 DIAGNOSIS — H6123 Impacted cerumen, bilateral: Secondary | ICD-10-CM | POA: Diagnosis not present

## 2019-06-27 IMAGING — RF DG CHOLANGIOGRAM OPERATIVE
1 series · 4 of 4 positions shown · non-contrast
Comparison: Abdominal ultrasound - 06/14/2017

CLINICAL DATA: Intraoperative cholangiogram during laparoscopic
cholecystectomy.

EXAM:
INTRAOPERATIVE CHOLANGIOGRAM
FLUOROSCOPY TIME:  Eighteen seconds (1.7 mGy)

[Series 1: run · 4 of 112 frames shown]
[frame 17/112]
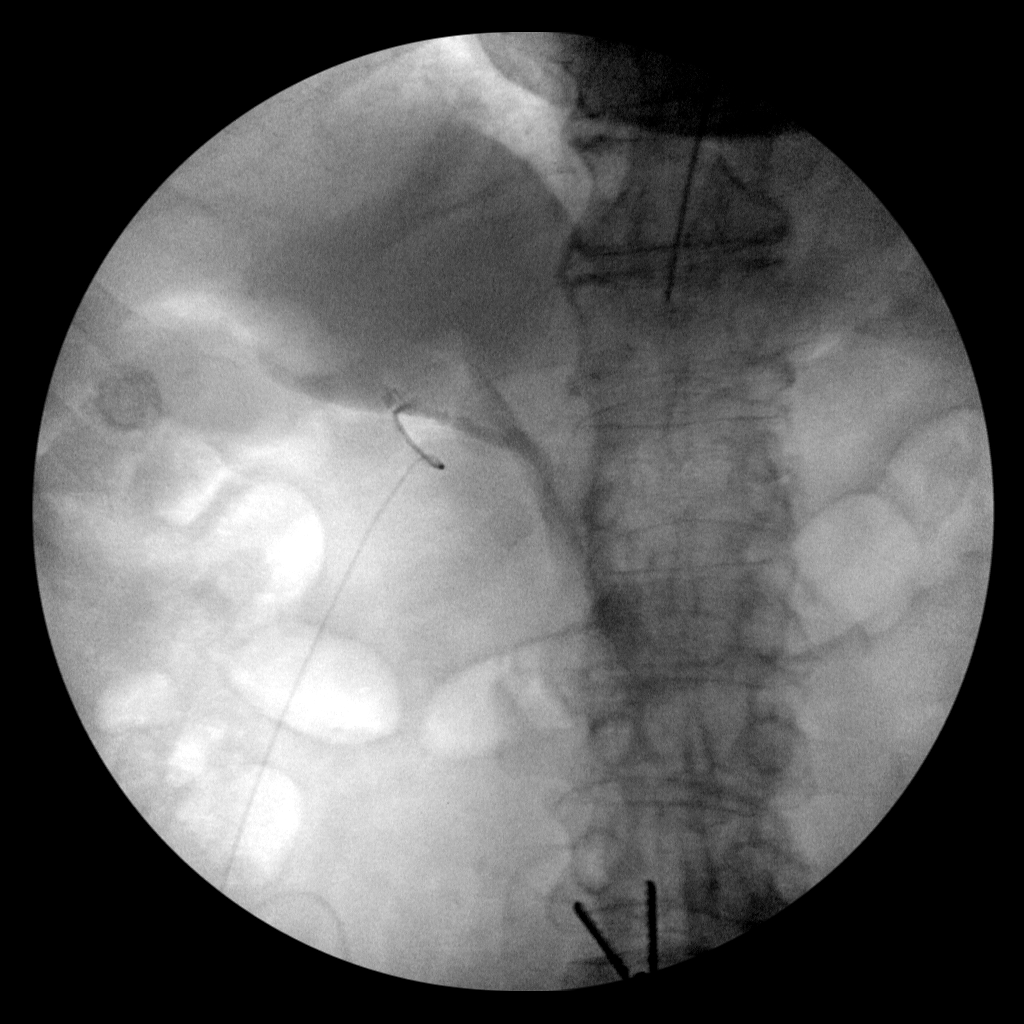
[frame 57/112]
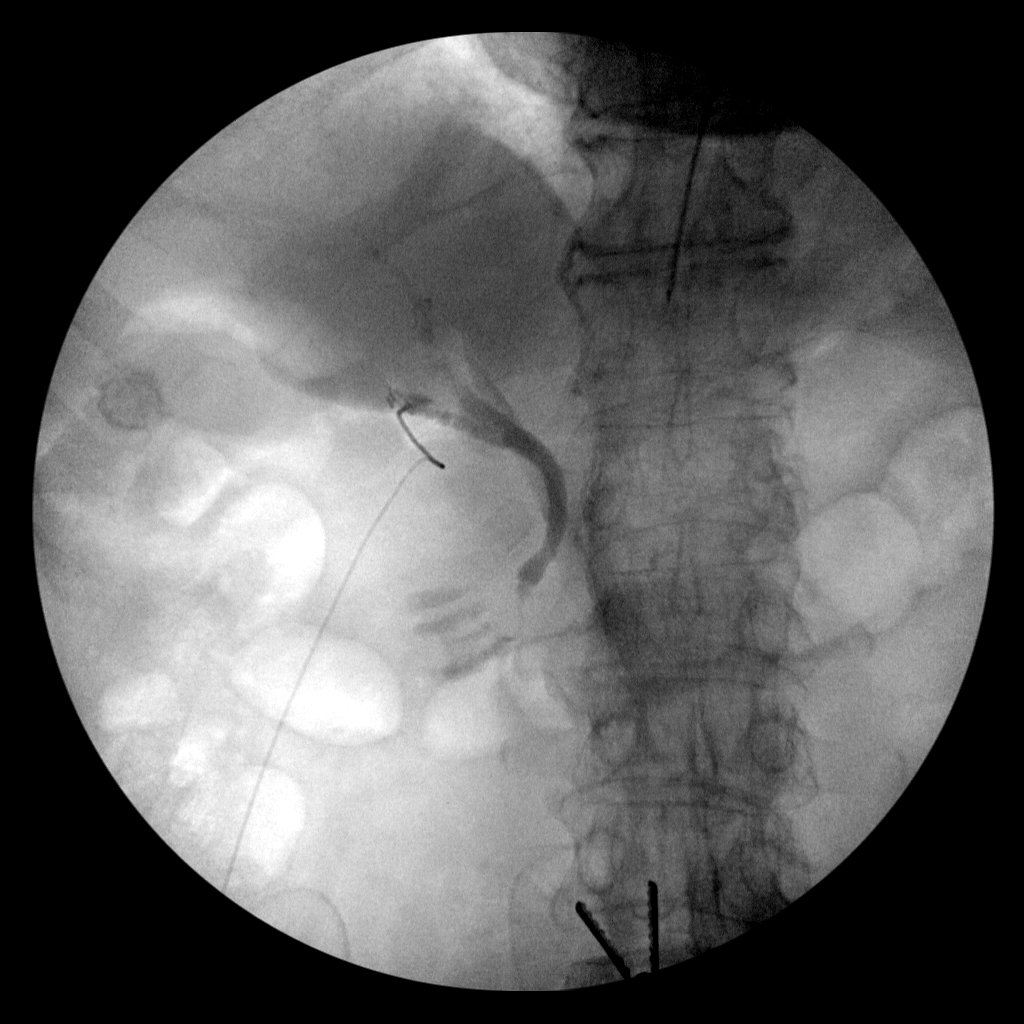
[frame 96/112]
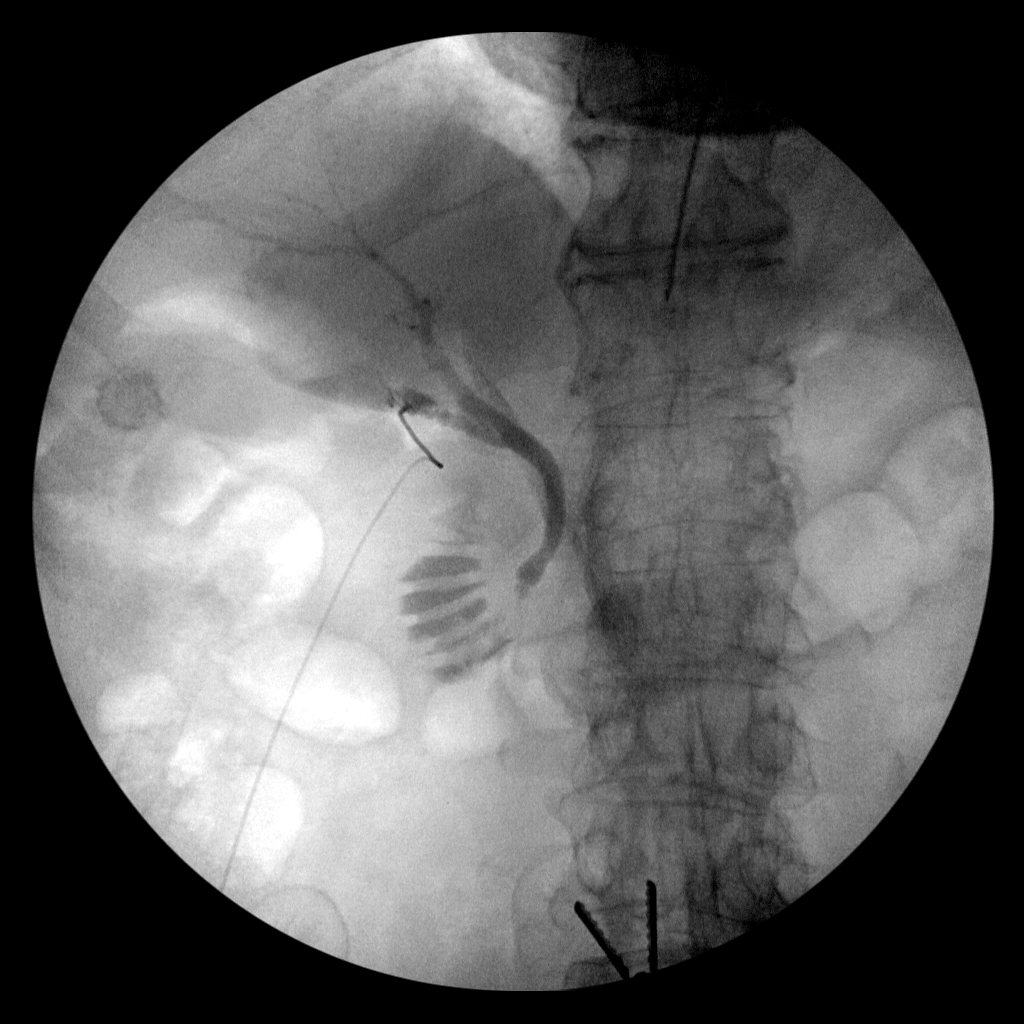
[frame 112/112]
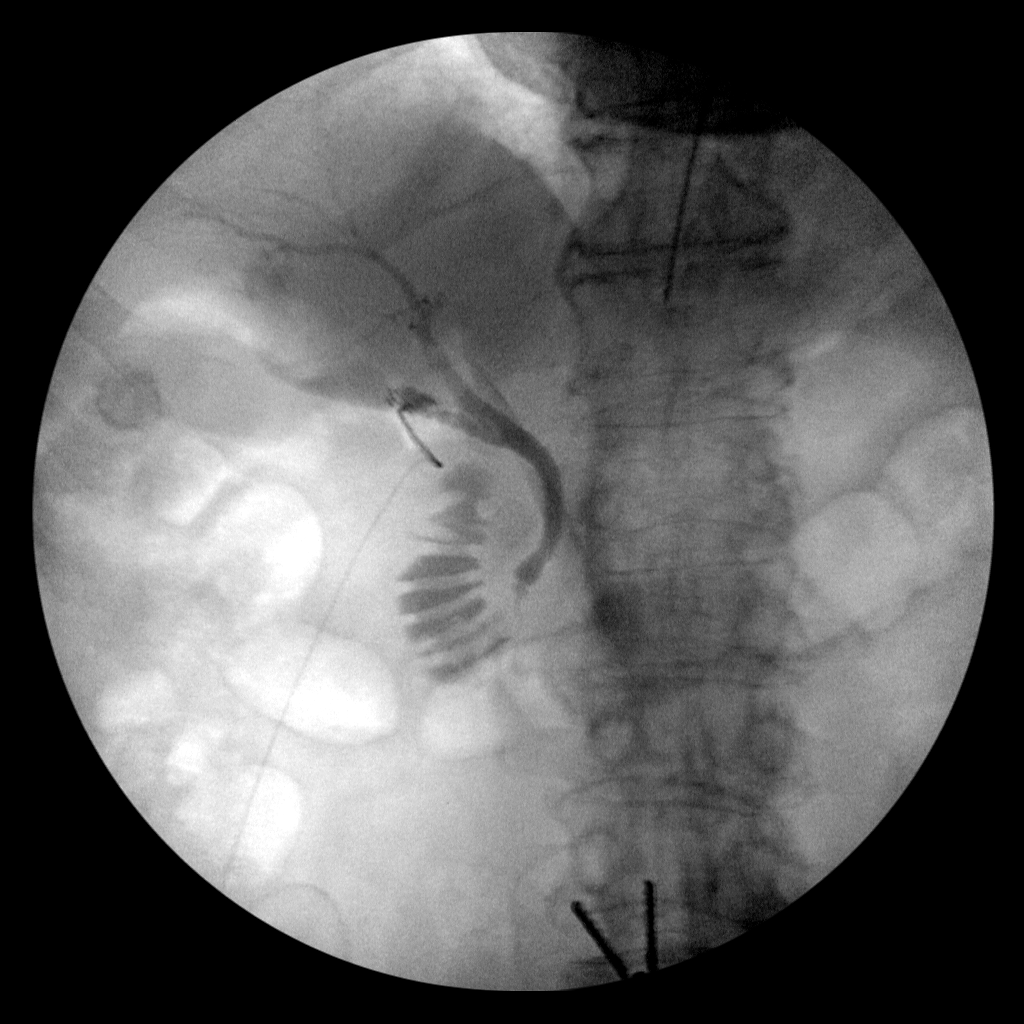

[4 of 4 positions shown; findings below may reference images not displayed]

FINDINGS: Intraoperative cholangiographic images of the right upper abdominal
quadrant during laparoscopic cholecystectomy are provided for
review.

Surgical clips overlie the expected location of the gallbladder
fossa.

Contrast injection demonstrates selective cannulation of the central
aspect of the cystic duct.

There is passage of contrast through the central aspect of the
cystic duct with filling of a non dilated common bile duct. There is
passage of contrast though the CBD and into the descending portion
of the duodenum.

There is minimal reflux of injected contrast into the common hepatic
duct and central aspect of the non dilated intrahepatic biliary
system.

There are no discrete filling defects within the opacified portions
of the biliary system to suggest the presence of
choledocholithiasis.
IMPRESSION: No evidence of choledocholithiasis.

## 2019-07-11 ENCOUNTER — Ambulatory Visit (INDEPENDENT_AMBULATORY_CARE_PROVIDER_SITE_OTHER): Payer: Medicare Other | Admitting: Family Medicine

## 2019-07-11 ENCOUNTER — Other Ambulatory Visit: Payer: Self-pay

## 2019-07-11 ENCOUNTER — Encounter: Payer: Self-pay | Admitting: Family Medicine

## 2019-07-11 VITALS — BP 130/88 | HR 61 | Temp 97.3°F | Ht 67.0 in | Wt 174.2 lb

## 2019-07-11 DIAGNOSIS — Z23 Encounter for immunization: Secondary | ICD-10-CM | POA: Diagnosis not present

## 2019-07-11 DIAGNOSIS — E785 Hyperlipidemia, unspecified: Secondary | ICD-10-CM

## 2019-07-11 DIAGNOSIS — N401 Enlarged prostate with lower urinary tract symptoms: Secondary | ICD-10-CM | POA: Diagnosis not present

## 2019-07-11 DIAGNOSIS — N138 Other obstructive and reflux uropathy: Secondary | ICD-10-CM | POA: Diagnosis not present

## 2019-07-11 DIAGNOSIS — R030 Elevated blood-pressure reading, without diagnosis of hypertension: Secondary | ICD-10-CM | POA: Diagnosis not present

## 2019-07-11 LAB — POC URINALSYSI DIPSTICK (AUTOMATED)
Bilirubin, UA: NEGATIVE
Blood, UA: NEGATIVE
Glucose, UA: NEGATIVE
Ketones, UA: NEGATIVE
Leukocytes, UA: NEGATIVE
Nitrite, UA: NEGATIVE
Protein, UA: NEGATIVE
Spec Grav, UA: 1.02 (ref 1.010–1.025)
Urobilinogen, UA: 0.2 E.U./dL
pH, UA: 6 (ref 5.0–8.0)

## 2019-07-11 LAB — HEPATIC FUNCTION PANEL
ALT: 10 U/L (ref 0–53)
AST: 14 U/L (ref 0–37)
Albumin: 4.3 g/dL (ref 3.5–5.2)
Alkaline Phosphatase: 52 U/L (ref 39–117)
Bilirubin, Direct: 0.1 mg/dL (ref 0.0–0.3)
Total Bilirubin: 0.8 mg/dL (ref 0.2–1.2)
Total Protein: 6.8 g/dL (ref 6.0–8.3)

## 2019-07-11 LAB — BASIC METABOLIC PANEL
BUN: 19 mg/dL (ref 6–23)
CO2: 29 mEq/L (ref 19–32)
Calcium: 9.3 mg/dL (ref 8.4–10.5)
Chloride: 104 mEq/L (ref 96–112)
Creatinine, Ser: 1.22 mg/dL (ref 0.40–1.50)
GFR: 57.06 mL/min — ABNORMAL LOW (ref >60.00)
Glucose, Bld: 97 mg/dL (ref 70–99)
Potassium: 4.4 mEq/L (ref 3.5–5.1)
Sodium: 140 mEq/L (ref 135–145)

## 2019-07-11 LAB — CBC WITH DIFFERENTIAL/PLATELET
Basophils Absolute: 0.1 10*3/uL (ref 0.0–0.1)
Basophils Relative: 1.8 % (ref 0.0–3.0)
Eosinophils Absolute: 0.1 10*3/uL (ref 0.0–0.7)
Eosinophils Relative: 1.6 % (ref 0.0–5.0)
HCT: 40.4 % (ref 39.0–52.0)
Hemoglobin: 13.6 g/dL (ref 13.0–17.0)
Lymphocytes Relative: 42.4 % (ref 12.0–46.0)
Lymphs Abs: 2 10*3/uL (ref 0.7–4.0)
MCHC: 33.8 g/dL (ref 30.0–36.0)
MCV: 94.5 fl (ref 78.0–100.0)
Monocytes Absolute: 0.5 10*3/uL (ref 0.1–1.0)
Monocytes Relative: 10 % (ref 3.0–12.0)
Neutro Abs: 2.1 10*3/uL (ref 1.4–7.7)
Neutrophils Relative %: 44.2 % (ref 43.0–77.0)
Platelets: 124 10*3/uL — ABNORMAL LOW (ref 150.0–400.0)
RBC: 4.28 Mil/uL (ref 4.22–5.81)
RDW: 14 % (ref 11.5–15.5)
WBC: 4.7 10*3/uL (ref 4.0–10.5)

## 2019-07-11 LAB — PSA: PSA: 2.33 ng/mL (ref 0.10–4.00)

## 2019-07-11 LAB — LIPID PANEL
Cholesterol: 233 mg/dL — ABNORMAL HIGH (ref 0–200)
HDL: 48.9 mg/dL (ref >39.00)
LDL Cholesterol: 170 mg/dL — ABNORMAL HIGH (ref 0–99)
NonHDL: 184.08
Total CHOL/HDL Ratio: 5
Triglycerides: 68 mg/dL (ref 0.0–149.0)
VLDL: 13.6 mg/dL (ref 0.0–40.0)

## 2019-07-11 LAB — TSH: TSH: 1.44 u[IU]/mL (ref 0.35–4.50)

## 2019-07-11 NOTE — Progress Notes (Signed)
   Subjective:    Patient ID: Peter Chandler, male    DOB: 03/06/1941, 78 y.o.   MRN: [id number]  HPI Here to follow up on issues. He feels great in general. He walks 2 miles every day. His BP is stable.    Review of Systems  Constitutional: Negative.   HENT: Negative.   Eyes: Negative.   Respiratory: Negative.   Cardiovascular: Negative.   Gastrointestinal: Negative.   Genitourinary: Negative.   Musculoskeletal: Negative.   Skin: Negative.   Neurological: Negative.   Psychiatric/Behavioral: Negative.        Objective:   Physical Exam Constitutional:      General: He is not in acute distress.    Appearance: He is well-developed. He is not diaphoretic.  HENT:     Head: Normocephalic and atraumatic.     Right Ear: External ear normal.     Left Ear: External ear normal.     Nose: Nose normal.     Mouth/Throat:     Pharynx: No oropharyngeal exudate.  Eyes:     General: No scleral icterus.       Right eye: No discharge.        Left eye: No discharge.     Conjunctiva/sclera: Conjunctivae normal.     Pupils: Pupils are equal, round, and reactive to light.  Neck:     Musculoskeletal: Neck supple.     Thyroid: No thyromegaly.     Vascular: No JVD.     Trachea: No tracheal deviation.  Cardiovascular:     Rate and Rhythm: Normal rate and regular rhythm.     Heart sounds: Normal heart sounds. No murmur. No friction rub. No gallop.   Pulmonary:     Effort: Pulmonary effort is normal. No respiratory distress.     Breath sounds: Normal breath sounds. No wheezing or rales.  Chest:     Chest wall: No tenderness.  Abdominal:     General: Bowel sounds are normal. There is no distension.     Palpations: Abdomen is soft. There is no mass.     Tenderness: There is no abdominal tenderness. There is no guarding or rebound.  Genitourinary:    Penis: No tenderness.   Musculoskeletal: Normal range of motion.        General: No tenderness.  Lymphadenopathy:     Cervical: No  cervical adenopathy.  Skin:    General: Skin is warm and dry.     Coloration: Skin is not pale.     Findings: No erythema or rash.  Neurological:     Mental Status: He is alert and oriented to person, place, and time.     Cranial Nerves: No cranial nerve deficit.     Motor: No abnormal muscle tone.     Coordination: Coordination normal.     Deep Tendon Reflexes: Reflexes are normal and symmetric. Reflexes normal.  Psychiatric:        Behavior: Behavior normal.        Thought Content: Thought content normal.        Judgment: Judgment normal.           Assessment & Plan:  He seems to be doing well. His BPH is stable and he has only nocturia times 1 or 2. His BP is stable. Get fasting labs today to include a lipid panel. Given a Prevnar vaccine and a flu shot.  Alysia Penna, MD

## 2019-07-13 IMAGING — CT CT ABD-PELV W/ CM
2 of 4 series · 15 of 46 positions shown, 17 images · IV contrast (ISOVUE)
Comparison: 08/24/2017 and previous

CLINICAL DATA: Cholecystectomy with postop abscess, status post
percutaneous drain catheter placement 08/24/2017. Pain around the
incision site.

EXAM:
CT ABDOMEN AND PELVIS WITH CONTRAST
TECHNIQUE: Multidetector CT imaging of the abdomen and pelvis was performed
using the standard protocol following bolus administration of
intravenous contrast.
CONTRAST:  100mL [id number] IOPAMIDOL ([id number]) INJECTION 61%

[Series 2: abd/pel with · axial · 0.79mm/px · z∈[-688,-263]mm · 12 of 97 slices shown, 14 images]
[im 6/97  soft-tissue]
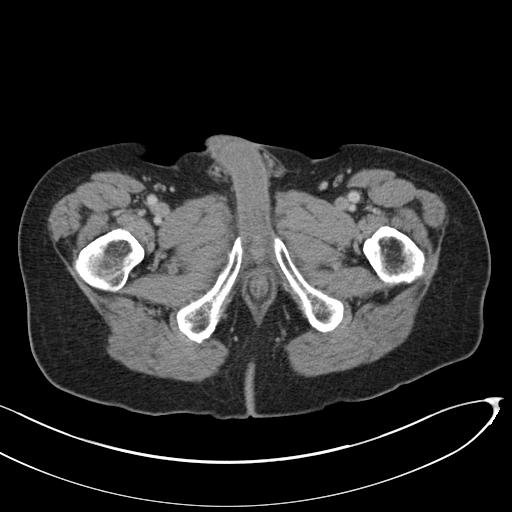
[im 6/97  bone]
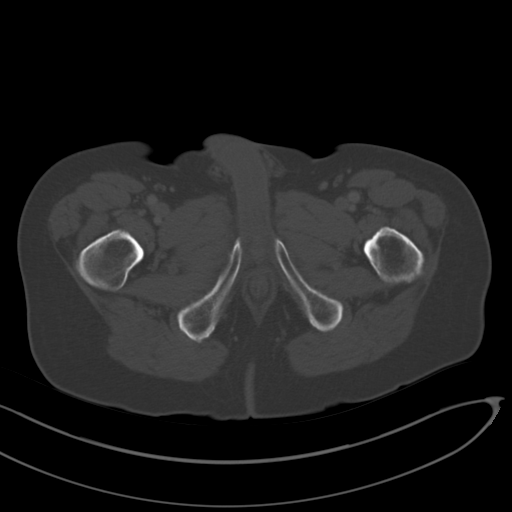
[im 17/97  soft-tissue]
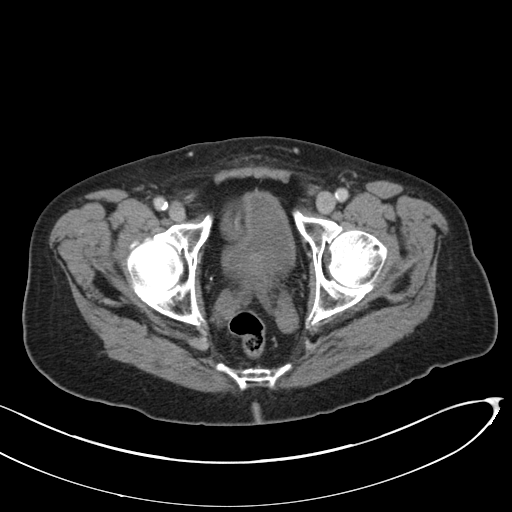
[im 22/97  soft-tissue]
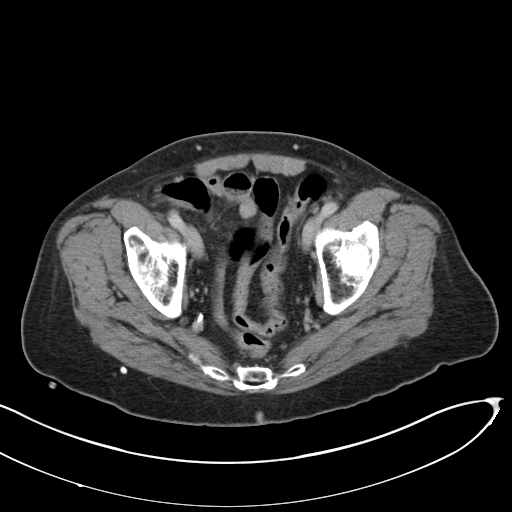
[im 27/97  soft-tissue]
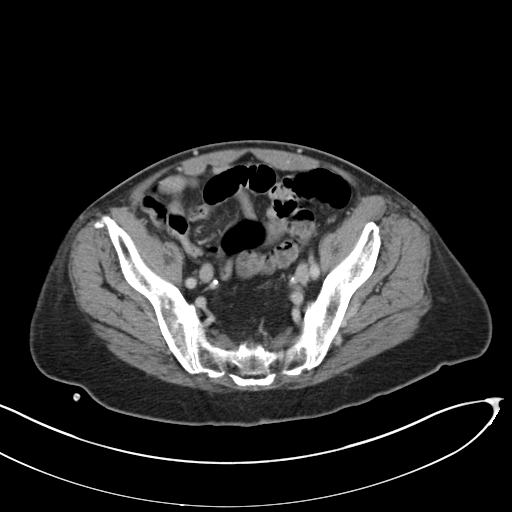
[im 38/97  soft-tissue]
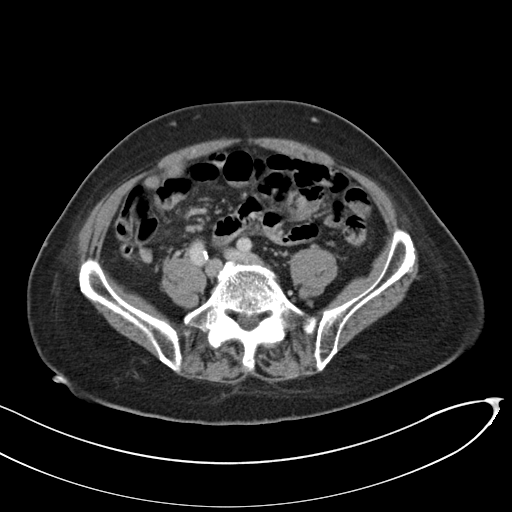
[im 43/97  soft-tissue]
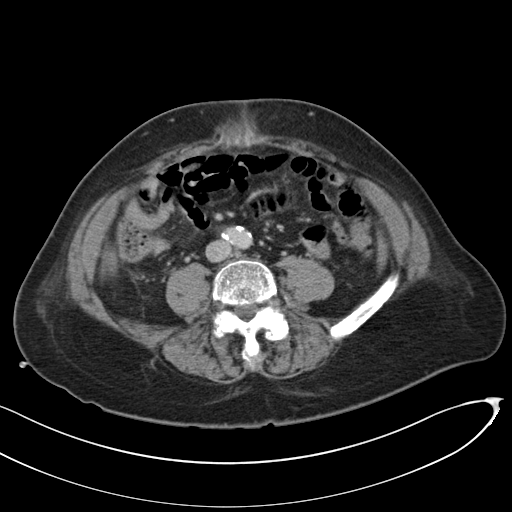
[im 54/97  soft-tissue]
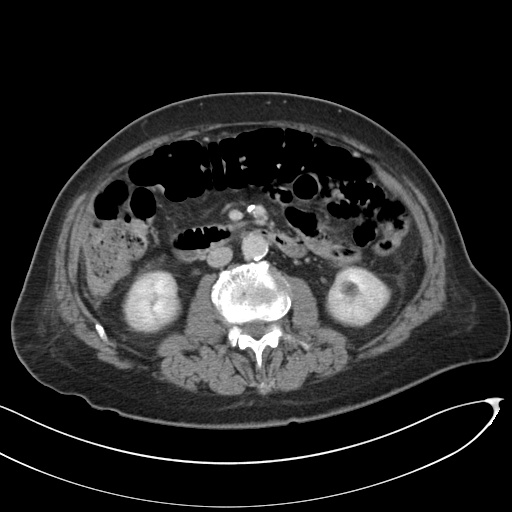
[im 59/97  soft-tissue]
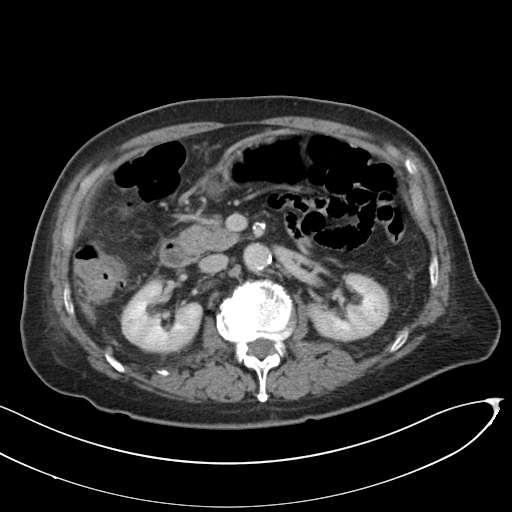
[im 70/97  soft-tissue]
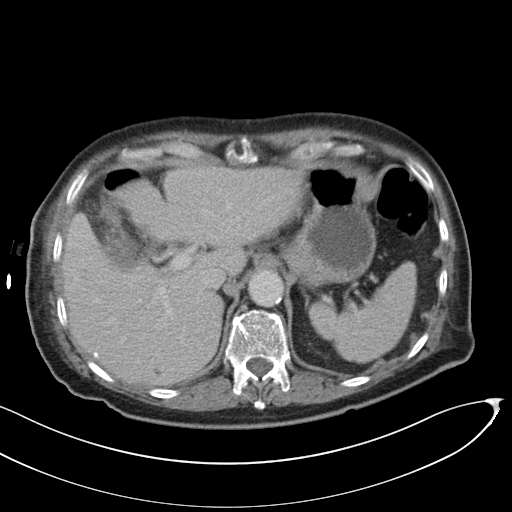
[im 70/97  bone]
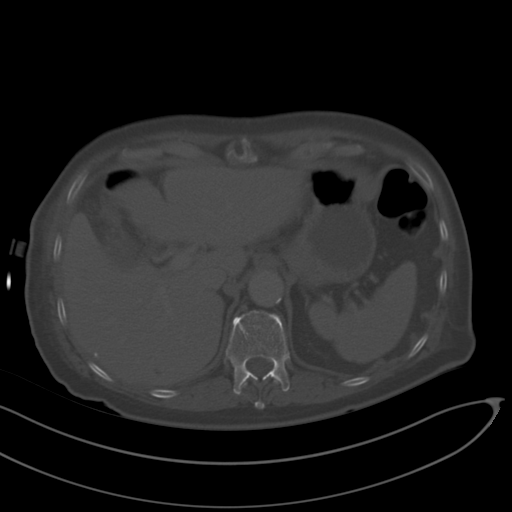
[im 75/97  soft-tissue]
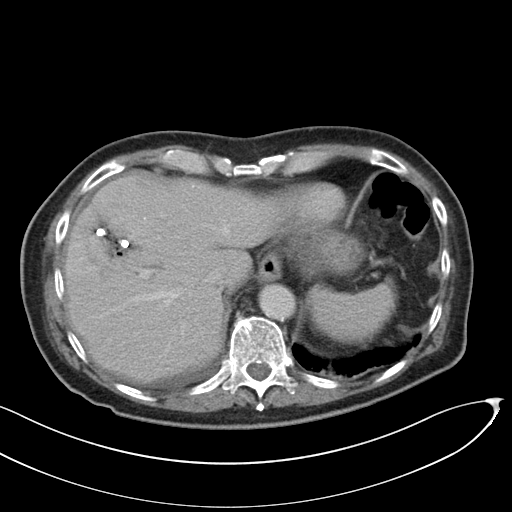
[im 81/97  soft-tissue]
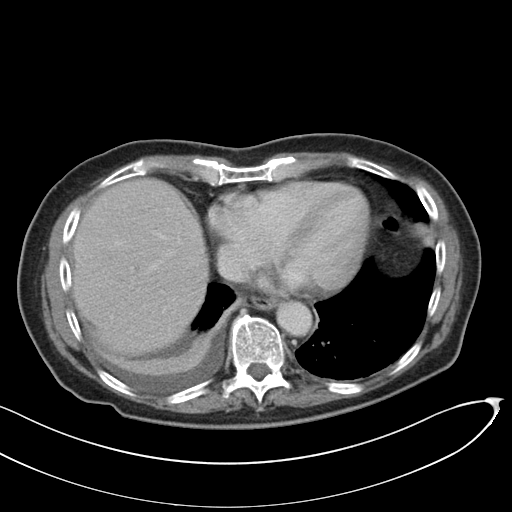
[im 91/97  soft-tissue]
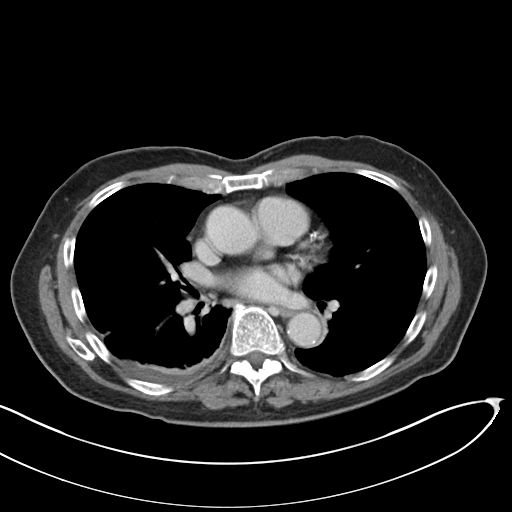

[Series 3: coronal a/|p · coronal · 0.74mm/px · 3 of 126 slices shown]
[im 42/126  soft-tissue]
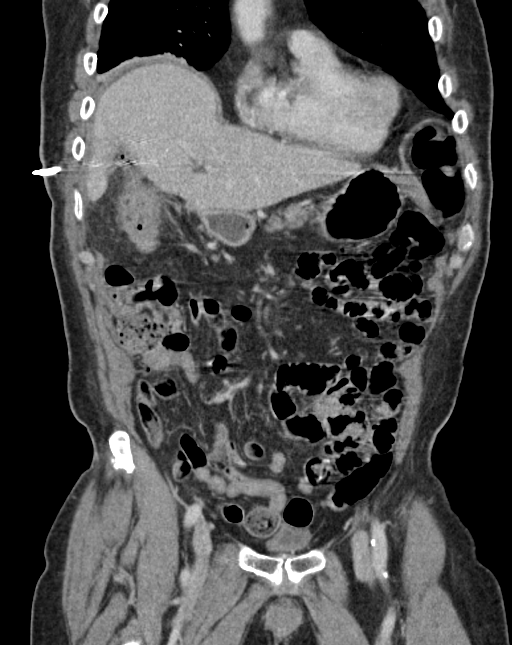
[im 56/126  soft-tissue]
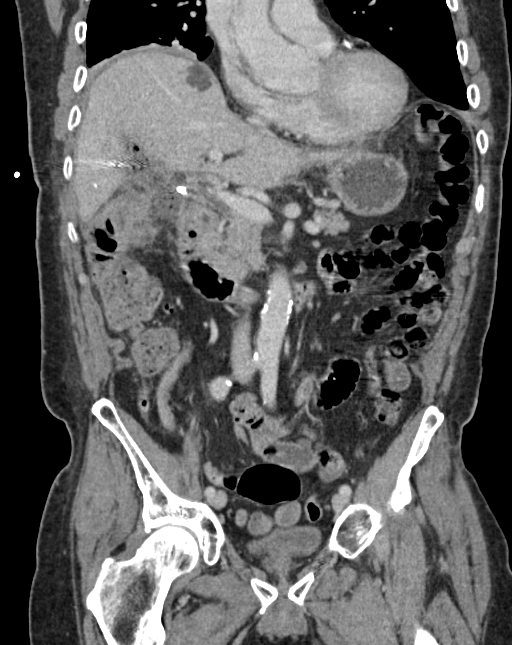
[im 70/126  soft-tissue]
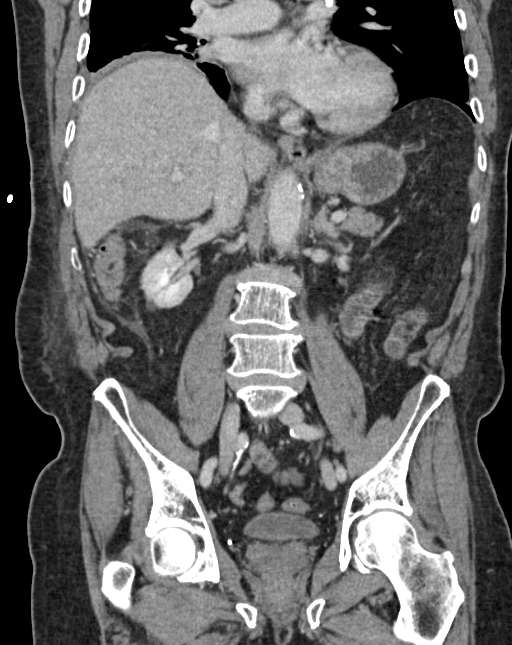

[15 of 46 positions shown; findings below may reference images not displayed]

FINDINGS: Lower chest: New small right pleural effusion with compressive
atelectasis/consolidation in the dependent aspect of the right lower
lung. No pneumothorax.

Hepatobiliary: Stable transhepatic drain catheter into the
gallbladder fossa . Stable lobulated cyst in segment 8 near the
dome. Cholecystectomy clips. No biliary ductal dilatation.

Pancreas: Unremarkable. No pancreatic ductal dilatation or
surrounding inflammatory changes.

Spleen: Normal in size without focal abnormality.

Adrenals/Urinary Tract: Adrenal glands are unremarkable. Kidneys are
normal, without renal calculi, focal lesion, or hydronephrosis.
Bladder is unremarkable.

Stomach/Bowel: Stomach is physiologically distended. Small bowel
decompressed. Normal appendix. Colon is nondilated, unremarkable.

Vascular/Lymphatic: Scattered aortoiliac arterial calcifications
without aneurysm or stenosis. No abdominal or pelvic adenopathy.
Bilateral pelvic phleboliths.

Reproductive: Mild prostatic enlargement.

Other: Significant decrease in subhepatic abscess post drain
catheter placement. There is a loculated residual gas and fluid
collection measured approximately 4.1 x 1 cm maximum transverse
dimensions, the drain catheter well positioned within its lateral
aspect. No new intraabdominal or retroperitoneal collection.

No ascites.  No free air.

Musculoskeletal: Degenerative changes in the visualized lower
thoracic and lumbar spine, most marked L1-2. Negative for fracture
or worrisome bone lesion.
IMPRESSION: 1. Interval decrease in size of gallbladder fossa abscess post
percutaneous drain catheter placement, without evident complication.
2. New small right pleural effusion with adjacent
consolidation/atelectasis in the dependent aspect right lower lobe.

## 2019-08-27 ENCOUNTER — Other Ambulatory Visit: Payer: Self-pay

## 2019-10-25 DIAGNOSIS — L814 Other melanin hyperpigmentation: Secondary | ICD-10-CM | POA: Diagnosis not present

## 2019-10-25 DIAGNOSIS — D1801 Hemangioma of skin and subcutaneous tissue: Secondary | ICD-10-CM | POA: Diagnosis not present

## 2019-10-25 DIAGNOSIS — Z85828 Personal history of other malignant neoplasm of skin: Secondary | ICD-10-CM | POA: Diagnosis not present

## 2019-10-25 DIAGNOSIS — L820 Inflamed seborrheic keratosis: Secondary | ICD-10-CM | POA: Diagnosis not present

## 2019-10-25 DIAGNOSIS — L905 Scar conditions and fibrosis of skin: Secondary | ICD-10-CM | POA: Diagnosis not present

## 2019-10-25 DIAGNOSIS — L111 Transient acantholytic dermatosis [Grover]: Secondary | ICD-10-CM | POA: Diagnosis not present

## 2019-10-25 DIAGNOSIS — L821 Other seborrheic keratosis: Secondary | ICD-10-CM | POA: Diagnosis not present

## 2019-10-26 ENCOUNTER — Ambulatory Visit: Payer: Medicare Other | Attending: Internal Medicine

## 2019-10-26 DIAGNOSIS — Z23 Encounter for immunization: Secondary | ICD-10-CM

## 2019-10-26 NOTE — Progress Notes (Signed)
   Covid-19 Vaccination Clinic  Name:  Peter Chandler    MRN: [id number] DOB: September 18, 1941  10/26/2019  Peter Chandler was observed post Covid-19 immunization for 15 minutes without incidence. He was provided with Vaccine Information Sheet and instruction to access the V-Safe system.   Peter Chandler was instructed to call 911 with any severe reactions post vaccine: Marland Kitchen Difficulty breathing  . Swelling of your face and throat  . A fast heartbeat  . A bad rash all over your body  . Dizziness and weakness    Immunizations Administered    Name Date Dose VIS Date Route   Pfizer COVID-19 Vaccine 10/26/2019  2:06 PM 0.3 mL 09/14/2019 Intramuscular   Manufacturer: [place]   Lot: [id number]   [place]: [id number]

## 2019-11-16 ENCOUNTER — Ambulatory Visit: Payer: Medicare Other | Attending: Internal Medicine

## 2019-11-16 DIAGNOSIS — Z23 Encounter for immunization: Secondary | ICD-10-CM | POA: Insufficient documentation

## 2019-11-16 NOTE — Progress Notes (Signed)
   Covid-19 Vaccination Clinic  Name:  Peter Chandler    MRN: [id number] DOB: October 14, 1940  11/16/2019  [name] was observed post Covid-19 immunization for 15 minutes without incidence. He was provided with Vaccine Information Sheet and instruction to access the V-Safe system.   [name] was instructed to call 911 with any severe reactions post vaccine: Marland Kitchen Difficulty breathing  . Swelling of your face and throat  . A fast heartbeat  . A bad rash all over your body  . Dizziness and weakness    Immunizations Administered    Name Date Dose VIS Date Route   Pfizer COVID-19 Vaccine 11/16/2019  8:33 AM 0.3 mL 09/14/2019 Intramuscular   Manufacturer: [place]   Lot: [id number]   [place]: [id number]

## 2020-02-01 DIAGNOSIS — H52203 Unspecified astigmatism, bilateral: Secondary | ICD-10-CM | POA: Diagnosis not present

## 2020-02-01 DIAGNOSIS — H5203 Hypermetropia, bilateral: Secondary | ICD-10-CM | POA: Diagnosis not present

## 2020-02-01 DIAGNOSIS — H524 Presbyopia: Secondary | ICD-10-CM | POA: Diagnosis not present

## 2020-02-01 DIAGNOSIS — H4912 Fourth [trochlear] nerve palsy, left eye: Secondary | ICD-10-CM | POA: Diagnosis not present

## 2020-02-01 DIAGNOSIS — H2513 Age-related nuclear cataract, bilateral: Secondary | ICD-10-CM | POA: Diagnosis not present

## 2020-02-04 ENCOUNTER — Other Ambulatory Visit: Payer: Self-pay

## 2020-02-04 ENCOUNTER — Encounter: Payer: Self-pay | Admitting: Family Medicine

## 2020-02-04 ENCOUNTER — Ambulatory Visit (INDEPENDENT_AMBULATORY_CARE_PROVIDER_SITE_OTHER): Payer: Medicare Other | Admitting: Family Medicine

## 2020-02-04 VITALS — BP 120/68 | HR 78 | Temp 97.6°F | Wt 176.6 lb

## 2020-02-04 DIAGNOSIS — R000 Tachycardia, unspecified: Secondary | ICD-10-CM | POA: Diagnosis not present

## 2020-02-04 NOTE — Progress Notes (Signed)
   Subjective:    Patient ID: Peter Chandler, male    DOB: Apr 27, 1941, 79 y.o.   MRN: [id number]  HPI Here for several episodes of heart racing this past weekend. Two nights ago he felt fine when he went to bed but he was awakened by a sensation that his heart was beating very fast. He cannot be sure id it was irregular or not. He felt lightheaded and was mildly SOB during this spell, but he denies any chest pain or pressure. This lasted several hours and he went back to sleep. The next morning he felt better but his pulse was in the 90's. This settled down during the day. Then again last night he woke up with a feeling of his heart racing, but this went away after only 30 minutes or so. Today he feels fine. He has never felt these sensations before. No recent medication changes. He had normal labs, including a TSH, last October with his physical exam.    Review of Systems  Constitutional: Negative.   Respiratory: Positive for shortness of breath.   Cardiovascular: Positive for palpitations. Negative for chest pain and leg swelling.  Neurological: Positive for light-headedness.       Objective:   Physical Exam Constitutional:      Appearance: Normal appearance. He is not ill-appearing.  Cardiovascular:     Rate and Rhythm: Normal rate and regular rhythm.     Pulses: Normal pulses.     Heart sounds: Normal heart sounds.     Comments: EKG today is normal  Pulmonary:     Effort: Pulmonary effort is normal.     Breath sounds: Normal breath sounds.  Neurological:     General: No focal deficit present.     Mental Status: He is alert and oriented to person, place, and time. Mental status is at baseline.           Assessment & Plan:  Episodes of heart racing, possibly atrial fibrillation or PSVT. We will set up a 48 hour Holter monitor. He will avoid caffeine. He knows to call 911 if he get chest pain or other symptoms.  Alysia Penna, MD

## 2020-02-18 NOTE — Addendum Note (Signed)
Addended by: Alysia Penna A on: 02/18/2020 04:02 PM   Modules accepted: Orders

## 2020-02-20 ENCOUNTER — Telehealth: Payer: Self-pay | Admitting: *Deleted

## 2020-02-20 NOTE — Addendum Note (Signed)
Addended by: Alysia Penna A on: 02/20/2020 08:10 AM   Modules accepted: Orders

## 2020-02-20 NOTE — Telephone Encounter (Signed)
3 day ZIO XT long term holter monitor to be shipped to patients home.  Instructions sent via My Chart Message.

## 2020-02-25 ENCOUNTER — Other Ambulatory Visit (INDEPENDENT_AMBULATORY_CARE_PROVIDER_SITE_OTHER): Payer: Medicare Other

## 2020-02-25 DIAGNOSIS — R000 Tachycardia, unspecified: Secondary | ICD-10-CM

## 2020-03-20 ENCOUNTER — Telehealth: Payer: Self-pay | Admitting: Cardiology

## 2020-03-20 MED ORDER — METOPROLOL SUCCINATE ER 25 MG PO TB24: 25.0000 mg | ORAL_TABLET | Freq: Every day | ORAL | 2 refills | Status: DC

## 2020-03-20 NOTE — Telephone Encounter (Signed)
Pt was returning [employer] call. Pt needs a call back at [phone]

## 2020-03-20 NOTE — Telephone Encounter (Signed)
See my Result Note  

## 2020-03-20 NOTE — Telephone Encounter (Signed)
New message   Per the patient sending a mychart message he is wanting to get the results of his heart monitor. Please call to discuss.

## 2020-03-20 NOTE — Telephone Encounter (Signed)
Dr. Sarajane Jews ordered monitor. Patient saw Dr. Percival Spanish. I am unsure which provider will be interpreting monitor that was uploaded to Epic today.   Will send to each MD

## 2020-03-20 NOTE — Telephone Encounter (Signed)
Peter Morale, MD  03/20/2020 12:24 PM EDT     The monitor showed he has frequent extra beats and some runs of fast beats. But nothing that is serious. I am going to let him try a medication that will slow this down a little. Call in Metoprolol succinate 25 mg daily, #30 with 2 rf. Recheck in 3-4 weeks   Left message for patient to call back. Rx sent in.

## 2020-03-20 NOTE — Telephone Encounter (Signed)
See result notes for further documentation.  

## 2020-03-21 ENCOUNTER — Other Ambulatory Visit: Payer: Self-pay

## 2020-03-21 MED ORDER — METOPROLOL SUCCINATE ER 25 MG PO TB24: 25.0000 mg | ORAL_TABLET | Freq: Every day | ORAL | 2 refills | Status: DC

## 2020-03-21 NOTE — Telephone Encounter (Signed)
See result notes. 

## 2020-04-24 ENCOUNTER — Other Ambulatory Visit: Payer: Self-pay

## 2020-04-24 ENCOUNTER — Ambulatory Visit (INDEPENDENT_AMBULATORY_CARE_PROVIDER_SITE_OTHER): Payer: Medicare Other | Admitting: Family Medicine

## 2020-04-24 ENCOUNTER — Encounter: Payer: Self-pay | Admitting: Family Medicine

## 2020-04-24 VITALS — BP 120/70 | HR 54 | Temp 98.0°F | Ht 67.0 in | Wt 175.0 lb

## 2020-04-24 DIAGNOSIS — R7611 Nonspecific reaction to tuberculin skin test without active tuberculosis: Secondary | ICD-10-CM | POA: Diagnosis not present

## 2020-04-24 DIAGNOSIS — D696 Thrombocytopenia, unspecified: Secondary | ICD-10-CM | POA: Diagnosis not present

## 2020-04-24 DIAGNOSIS — N401 Enlarged prostate with lower urinary tract symptoms: Secondary | ICD-10-CM

## 2020-04-24 DIAGNOSIS — R000 Tachycardia, unspecified: Secondary | ICD-10-CM | POA: Diagnosis not present

## 2020-04-24 DIAGNOSIS — Z209 Contact with and (suspected) exposure to unspecified communicable disease: Secondary | ICD-10-CM | POA: Diagnosis not present

## 2020-04-24 DIAGNOSIS — E785 Hyperlipidemia, unspecified: Secondary | ICD-10-CM | POA: Diagnosis not present

## 2020-04-24 DIAGNOSIS — N138 Other obstructive and reflux uropathy: Secondary | ICD-10-CM | POA: Diagnosis not present

## 2020-04-24 DIAGNOSIS — R030 Elevated blood-pressure reading, without diagnosis of hypertension: Secondary | ICD-10-CM

## 2020-04-24 MED ORDER — METOPROLOL SUCCINATE ER 25 MG PO TB24: 25.0000 mg | ORAL_TABLET | Freq: Every day | ORAL | 3 refills | Status: DC

## 2020-04-24 NOTE — Progress Notes (Signed)
° °  Subjective:    Patient ID: Peter Chandler, male    DOB: [birth year], 79 y.o.   MRN: [id number]  HPI Here with his wife to fill out forms for [employer], a [employer] in [place]. They have a detailed medical form for the PCP to fill out. He feels fine today. We recently started him on Metoprolol succinate 25 mg daily for tachycardia, and this has worked quite well for him.    Review of Systems  Constitutional: Negative.   Respiratory: Negative.   Cardiovascular: Negative.   Gastrointestinal: Negative.   Genitourinary: Negative.   Neurological: Negative.   Psychiatric/Behavioral: Negative.        Objective:   Physical Exam Constitutional:      Appearance: Normal appearance.  Cardiovascular:     Rate and Rhythm: Normal rate and regular rhythm.     Pulses: Normal pulses.     Heart sounds: Normal heart sounds.  Pulmonary:     Effort: Pulmonary effort is normal.     Breath sounds: Normal breath sounds.  Neurological:     General: No focal deficit present.     Mental Status: He is alert and oriented to person, place, and time.           Assessment & Plan:  He is doing well. The tachycardia is now well controlled. We will get fasting labs and we can then fill out his forms for the community above. Alysia Penna, MD

## 2020-04-25 LAB — CBC WITH DIFFERENTIAL/PLATELET
Absolute Monocytes: 515 cells/uL (ref 200–950)
Basophils Absolute: 51 cells/uL (ref 0–200)
Basophils Relative: 1 %
Eosinophils Absolute: 61 cells/uL (ref 15–500)
Eosinophils Relative: 1.2 %
HCT: 41.5 % (ref 38.5–50.0)
Hemoglobin: 13.7 g/dL (ref 13.2–17.1)
Lymphs Abs: 1785 cells/uL (ref 850–3900)
MCH: 31.4 pg (ref 27.0–33.0)
MCHC: 33 g/dL (ref 32.0–36.0)
MCV: 95 fL (ref 80.0–100.0)
MPV: 11.9 fL (ref 7.5–12.5)
Monocytes Relative: 10.1 %
Neutro Abs: 2688 cells/uL (ref 1500–7800)
Neutrophils Relative %: 52.7 %
Platelets: 129 10*3/uL — ABNORMAL LOW (ref 140–400)
RBC: 4.37 10*6/uL (ref 4.20–5.80)
RDW: 12.6 % (ref 11.0–15.0)
Total Lymphocyte: 35 %
WBC: 5.1 10*3/uL (ref 3.8–10.8)

## 2020-04-25 LAB — PSA: PSA: 1.7 ng/mL (ref <=4.0)

## 2020-04-25 LAB — BASIC METABOLIC PANEL
BUN/Creatinine Ratio: 13 (calc) (ref 6–22)
BUN: 15 mg/dL (ref 7–25)
CO2: 28 mmol/L (ref 20–32)
Calcium: 9.2 mg/dL (ref 8.6–10.3)
Chloride: 102 mmol/L (ref 98–110)
Creat: 1.14 mg/dL — ABNORMAL HIGH (ref 0.70–1.11)
Glucose, Bld: 99 mg/dL (ref 65–99)
Potassium: 4.9 mmol/L (ref 3.5–5.3)
Sodium: 140 mmol/L (ref 135–146)

## 2020-04-25 LAB — LIPID PANEL
Cholesterol: 216 mg/dL — ABNORMAL HIGH (ref <200)
HDL: 54 mg/dL (ref >=40)
LDL Cholesterol (Calc): 145 mg/dL (calc) — ABNORMAL HIGH
Non-HDL Cholesterol (Calc): 162 mg/dL (calc) — ABNORMAL HIGH (ref <130)
Total CHOL/HDL Ratio: 4 (calc) (ref <5.0)
Triglycerides: 72 mg/dL (ref <150)

## 2020-04-25 LAB — TSH: TSH: 1.77 mIU/L (ref 0.40–4.50)

## 2020-04-25 LAB — HEPATIC FUNCTION PANEL
AG Ratio: 1.9 (calc) (ref 1.0–2.5)
ALT: 8 U/L — ABNORMAL LOW (ref 9–46)
AST: 14 U/L (ref 10–35)
Albumin: 4.5 g/dL (ref 3.6–5.1)
Alkaline phosphatase (APISO): 71 U/L (ref 35–144)
Bilirubin, Direct: 0.1 mg/dL (ref 0.0–0.2)
Globulin: 2.4 g/dL (calc) (ref 1.9–3.7)
Indirect Bilirubin: 0.6 mg/dL (calc) (ref 0.2–1.2)
Total Bilirubin: 0.7 mg/dL (ref 0.2–1.2)
Total Protein: 6.9 g/dL (ref 6.1–8.1)

## 2020-04-25 LAB — RPR: RPR Ser Ql: NONREACTIVE

## 2020-04-28 ENCOUNTER — Telehealth: Payer: Self-pay

## 2020-04-28 NOTE — Telephone Encounter (Signed)
Pt came to the office last week with spouse. Pt gave Dr.Fry PPW to be completed for [facility]. PPW has been completed and mailed to [facility] per request. Pt notified of update. No further action needed.

## 2020-05-13 DIAGNOSIS — H5053 Vertical heterophoria: Secondary | ICD-10-CM | POA: Diagnosis not present

## 2020-05-13 DIAGNOSIS — H05822 Myopathy of extraocular muscles, left orbit: Secondary | ICD-10-CM | POA: Diagnosis not present

## 2020-06-10 DIAGNOSIS — H6123 Impacted cerumen, bilateral: Secondary | ICD-10-CM | POA: Diagnosis not present

## 2020-07-07 DIAGNOSIS — Z23 Encounter for immunization: Secondary | ICD-10-CM | POA: Diagnosis not present

## 2020-07-16 ENCOUNTER — Ambulatory Visit (INDEPENDENT_AMBULATORY_CARE_PROVIDER_SITE_OTHER): Payer: Medicare Other | Admitting: *Deleted

## 2020-07-16 DIAGNOSIS — Z23 Encounter for immunization: Secondary | ICD-10-CM | POA: Diagnosis not present

## 2020-09-17 ENCOUNTER — Telehealth: Payer: Self-pay | Admitting: Family Medicine

## 2020-09-17 NOTE — Telephone Encounter (Signed)
Pt is calling in stating that the Rx metoprolol succinate (TOPROL -XL) 25 MG is making his heart rate very low.  Pt was transferred to the Triage Nurse for assistance.

## 2020-09-17 NOTE — Telephone Encounter (Signed)
Pt called back and stated that he was advised to be seen within the 24 hours but per patient he wanted to wait for a call back from provider or nurse then he will make an appointment.

## 2020-09-17 NOTE — Telephone Encounter (Signed)
I spoke with patient. Appointment made for 10:30 Friday morning with Dr. Jerilee Hoh.

## 2020-09-17 NOTE — Telephone Encounter (Signed)
He will need an OV for this

## 2020-09-19 ENCOUNTER — Encounter: Payer: Self-pay | Admitting: Internal Medicine

## 2020-09-19 ENCOUNTER — Ambulatory Visit (INDEPENDENT_AMBULATORY_CARE_PROVIDER_SITE_OTHER): Payer: Medicare Other | Admitting: Internal Medicine

## 2020-09-19 ENCOUNTER — Other Ambulatory Visit: Payer: Self-pay

## 2020-09-19 VITALS — BP 110/70 | HR 51 | Temp 98.1°F | Wt 169.4 lb

## 2020-09-19 DIAGNOSIS — R001 Bradycardia, unspecified: Secondary | ICD-10-CM | POA: Diagnosis not present

## 2020-09-19 DIAGNOSIS — R42 Dizziness and giddiness: Secondary | ICD-10-CM | POA: Diagnosis not present

## 2020-09-19 NOTE — Progress Notes (Signed)
Established Patient Office Visit     This visit occurred during the SARS-CoV-2 public health emergency.  Safety protocols were in place, including screening questions prior to the visit, additional usage of staff PPE, and extensive cleaning of exam room while observing appropriate contact time as indicated for disinfecting solutions.    CC/Reason for Visit: Discuss lightheadedness, low heartbeat  HPI: Peter Chandler is a 79 y.o. male who is coming in today for the above mentioned reasons.  He is a patient of Dr. Sarajane Jews.  Last July he was seen after palpitations and tachycardia, he was started on metoprolol succinate 25 mg daily.  There has been no documentation of A. fib on prior EKGs.  Over the past couple weeks he has noticed on occasion an episode of lightheadedness that will last 30 seconds to 1 minute.  He started tracking his blood pressure and heart beat.  He has not been hypotensive (his lowest blood pressure has been 116/52) but he has noted some periods of bradycardia with heart rates into the low 50s that seem to coincide with when he feels mostly lightheaded.  He denies chest pain, shortness of breath or any other symptoms.  Past Medical/Surgical History: Past Medical History:  Diagnosis Date  . Arthritis    "hands" (05/13/2016)  . Basal cell carcinoma    "face, back"  . Bleeding per rectum 05/13/2016  . BPH (benign prostatic hyperplasia) 09/2015  . Cholelithiasis 09/2015   noted on CT done by urology for his gross hematuria w/u--pt was referred to gen surg by Dr. Ralene Muskrat office  . Chronic thrombocytopenic purpura ([facility])    [name] 05/13/2016  . Complication of anesthesia    slow to awaken after anesthesia  . Diplopia 2017   left eye.  glasses lens fitted with a prism  . Gross hematuria 09/2015   Painless.  cystoscopy ~ 09/2015, Dr Jeffie Pollock.  Conclusion (per pt) is source of blood is the prostate.    . Hyperlipidemia     Past Surgical History:  Procedure Laterality Date   . COLONOSCOPY  11/05/2002   Internal hemorrhoids, hyperplastic sigmoid polyp, ascending erythema, descending venous lake, hypertrophied papilla. Dr. Aurelio Jew, [place]  . COLONOSCOPY N/A 05/14/2016   per Dr. Carlean Purl, hypertrophied anal papillae but no polyps, no repeats needed   . CYSTOSCOPY  ~ 09/2015   to eval gross hematuria.  Dr Jeffie Pollock at [facility].   . ENDOSCOPIC RETROGRADE CHOLANGIOPANCREATOGRAPHY (ERCP) WITH PROPOFOL N/A 08/30/2017   Procedure: ENDOSCOPIC RETROGRADE CHOLANGIOPANCREATOGRAPHY (ERCP) WITH PROPOFOL;  Surgeon: Ladene Artist, MD;  Location: [facility];  Service: Endoscopy;  Laterality: N/A;  . IR RADIOLOGIST EVAL & MGMT  09/08/2017  . IR RADIOLOGIST EVAL & MGMT  09/20/2017  . IR RADIOLOGIST EVAL & MGMT  10/06/2017  . LAPAROSCOPIC CHOLECYSTECTOMY SINGLE SITE WITH INTRAOPERATIVE CHOLANGIOGRAM N/A 08/10/2017   Procedure: LAPAROSCOPIC CHOLECYSTECTOMY SINGLE SITE WITH INTRAOPERATIVE CHOLANGIOGRAM;  Surgeon: Michael Boston, MD;  Location: [facility];  Service: General;  Laterality: N/A;  . right site perc tube  08/24/2017    Social History:  reports that he has never smoked. He has never used smokeless tobacco. He reports that he does not drink alcohol and does not use drugs.  Allergies: Allergies  Allergen Reactions  . Penicillins Anaphylaxis    Has patient had a PCN reaction causing immediate rash, facial/tongue/throat swelling, SOB or lightheadedness with hypotension: YES Has patient had a PCN reaction causing severe rash involving mucus membranes or skin necrosis: NO Has  patient had a PCN reaction that required hospitalization NO Has patient had a PCN reaction occurring within the last 10 years: NO If all of the above answers are "NO", then may proceed with Cephalosporin use.   . Magnesium-Containing Compounds     anaphylactic shock  . Prednisone Hives    Family History:  Family History  Problem Relation Age of Onset  . Ovarian cancer Other   . Heart disease Other    . Heart disease Father 22       Died age 66     Current Outpatient Medications:  .  metoprolol succinate (TOPROL-XL) 25 MG 24 hr tablet, Take 1 tablet (25 mg total) by mouth daily., Disp: 90 tablet, Rfl: 3  Review of Systems:  Constitutional: Denies fever, chills, diaphoresis, appetite change and fatigue.  HEENT: Denies photophobia, eye pain, redness, hearing loss, ear pain, congestion, sore throat, rhinorrhea, sneezing, mouth sores, trouble swallowing, neck pain, neck stiffness and tinnitus.   Respiratory: Denies SOB, DOE, cough, chest tightness,  and wheezing.   Cardiovascular: Denies chest pain, palpitations and leg swelling.  Gastrointestinal: Denies nausea, vomiting, abdominal pain, diarrhea, constipation, blood in stool and abdominal distention.  Genitourinary: Denies dysuria, urgency, frequency, hematuria, flank pain and difficulty urinating.  Endocrine: Denies: hot or cold intolerance, sweats, changes in hair or nails, polyuria, polydipsia. Musculoskeletal: Denies myalgias, back pain, joint swelling, arthralgias and gait problem.  Skin: Denies pallor, rash and wound.  Neurological: Denies dizziness, seizures, syncope, weakness, light-headedness, numbness and headaches.  Hematological: Denies adenopathy. Easy bruising, personal or family bleeding history  Psychiatric/Behavioral: Denies suicidal ideation, mood changes, confusion, nervousness, sleep disturbance and agitation    Physical Exam: Vitals:   09/19/20 1023  BP: 110/70  Pulse: (!) 51  Temp: 98.1 F (36.7 C)  TempSrc: Oral  SpO2: 97%  Weight: 169 lb 6.4 oz (76.8 kg)    Body mass index is 26.53 kg/m.   Constitutional: NAD, calm, comfortable Eyes: PERRL, lids and conjunctivae normal, wears corrective lenses ENMT: Mucous membranes are moist. Respiratory: clear to auscultation bilaterally, no wheezing, no crackles. Normal respiratory effort. No accessory muscle use.  Cardiovascular: Regular rate and rhythm, no  murmurs / rubs / gallops. No extremity edema.   Neurologic: Grossly intact and nonfocal. Psychiatric: Normal judgment and insight. Alert and oriented x 3. Normal mood.    Impression and Plan:  Lightheadedness Bradycardia  -He will take metoprolol only as needed for palpitations or tachycardia. -If symptoms continue, I would recommend he wear an event monitor, wonder about A. fib.    Lelon Frohlich, MD [facility]

## 2020-11-03 ENCOUNTER — Encounter: Payer: Self-pay | Admitting: Cardiovascular Disease

## 2020-11-03 NOTE — Progress Notes (Signed)
Cardiology Office Note:    Date:  11/04/2020   ID:  Peter Chandler, DOB 03/17/1941, MRN [id number]  PCP:  Nelwyn SalisburyFry, Stephen A, MD  [facility] Cardiologist:/ [place] [facility] Electrophysiologist:  None   Referring MD: Nelwyn SalisburyFry, Stephen A, MD   Chief Complaint  Patient presents with  . Tachycardia     Feb. 1, 2022   Peter DegreeBobby R Chandler is a 80 y.o. male with a hx of tachycardia.  Seen with wife, Peter Chandler .  He was recently seen by [name] for lightheadedness.   I see his wife and he requested to be seen for further evaluation  May 3,  Saw [name] for fast HR .  Tried metoprolol  XL .   Seemed to resolve the tachycardia In Dec.  Started having orthostasis when standing up from a bent over position  Saw [name] , metoprolol was stopped  Has been on a low sodium diet.  Eating and drinking well  Exercises regularly ,  Walks 30 min  a day    He wore an event monitor in June, 2021 which showed sinus rhythm including sinus brady, NSR, and sinus tach He had frequent PVCs  He has been keeping a blood pressure log.  His blood pressure readings are in the low or normal range.  In comparing his blood pressure cuff versus our blood pressure cuff we found that his blood pressure cuff was reading 20 points higher than our calibrated cuffs.  Given this information, it is likely that he has been running hypotensive for quite some time.   Past Medical History:  Diagnosis Date  . Arthritis    "hands" (05/13/2016)  . Basal cell carcinoma    "face, back"  . Bleeding per rectum 05/13/2016  . BPH (benign prostatic hyperplasia) 09/2015  . Cholelithiasis 09/2015   noted on CT done by urology for his gross hematuria w/u--pt was referred to gen surg by Dr. Belva CromeWrenn's office  . Chronic thrombocytopenic purpura ([facility])    Hattie Perch/notes 05/13/2016  . Complication of anesthesia    slow to awaken after anesthesia  . Diplopia 2017   left eye.  glasses lens fitted with a prism  . Gross hematuria 09/2015    Painless.  cystoscopy ~ 09/2015, Dr Annabell HowellsWrenn.  Conclusion (per pt) is source of blood is the prostate.    . Hyperlipidemia     Past Surgical History:  Procedure Laterality Date  . COLONOSCOPY  11/05/2002   Internal hemorrhoids, hyperplastic sigmoid polyp, ascending erythema, descending venous lake, hypertrophied papilla. Dr. Yaakov GuthriePike [place], [place]  . COLONOSCOPY N/A 05/14/2016   per Dr. Leone PayorGessner, hypertrophied anal papillae but no polyps, no repeats needed   . CYSTOSCOPY  ~ 09/2015   to eval gross hematuria.  [facility]   . ENDOSCOPIC RETROGRADE CHOLANGIOPANCREATOGRAPHY (ERCP) WITH PROPOFOL N/A 08/30/2017   Procedure: ENDOSCOPIC RETROGRADE CHOLANGIOPANCREATOGRAPHY (ERCP) WITH PROPOFOL;  Surgeon: [name], [name], MD;  [facility];  Service: Endoscopy;  Laterality: N/A;  . IR RADIOLOGIST EVAL & MGMT  09/08/2017  . IR RADIOLOGIST EVAL & MGMT  09/20/2017  . IR RADIOLOGIST EVAL & MGMT  10/06/2017  . LAPAROSCOPIC CHOLECYSTECTOMY SINGLE SITE WITH INTRAOPERATIVE CHOLANGIOGRAM N/A 08/10/2017   Procedure: LAPAROSCOPIC CHOLECYSTECTOMY SINGLE SITE WITH INTRAOPERATIVE CHOLANGIOGRAM;  Surgeon: [name], [name], MD;  Location: [facility];  Service: General;  Laterality: N/A;  . right site perc tube  08/24/2017    Current Medications: No outpatient medications have been marked as taking for the  11/04/20 encounter (Office Visit) with Luba Matzen, [name], MD.     Allergies:   Penicillins, Magnesium-containing compounds, and Prednisone   Social History   Socioeconomic History  . Marital status: Married    Spouse name: Not on file  . Number of children: Not on file  . Years of education: Not on file  . Highest education level: Not on file  Occupational History  . Not on file  Tobacco Use  . Smoking status: Never Smoker  . Smokeless tobacco: Never Used  Vaping Use  . Vaping Use: Never used  Substance and Sexual Activity  . Alcohol use: No    Alcohol/week: 0.0 standard drinks  . Drug use: No   . Sexual activity: Yes  Other Topics Concern  . 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     Social Determinants of Health   Financial Resource Strain: Not on file  Food Insecurity: Not on file  Transportation Needs: Not on file  Physical Activity: Not on file  Stress: Not on file  Social Connections: Not on file     Family History: The patient's family history includes Heart disease in an other family member; Heart disease (age of onset: 70) in his father; Ovarian cancer in an other family member.  ROS:   Please see the history of present illness.     All other systems reviewed and are negative.  EKGs/Labs/Other Studies Reviewed:    The following studies were reviewed today:   EKG:    Recent Labs: 04/24/2020: ALT 8; BUN 15; Creat 1.14; Hemoglobin 13.7; Platelets 129; Potassium 4.9; Sodium 140; TSH 1.77  Recent Lipid Panel    Component Value Date/Time   CHOL 216 (H) 04/24/2020 1102   TRIG 72 04/24/2020 1102   HDL 54 04/24/2020 1102   CHOLHDL 4.0 04/24/2020 1102   VLDL 13.6 07/11/2019 0828   LDLCALC 145 (H) 04/24/2020 1102   LDLDIRECT 181.1 01/14/2011 0947     Risk Assessment/Calculations:       Physical Exam:    VS:  BP 128/64   Pulse 65   Ht 5\' 7"  (1.702 m)   Wt 167 lb 9.6 oz (76 kg)   SpO2 99%   BMI 26.25 kg/m     Wt Readings from Last 3 Encounters:  11/04/20 167 lb 9.6 oz (76 kg)  09/19/20 169 lb 6.4 oz (76.8 kg)  04/24/20 175 lb (79.4 kg)     GEN:  Well nourished, well developed in no acute distress HEENT: Normal NECK: No JVD; No carotid bruits LYMPHATICS: No lymphadenopathy CARDIAC: RRR, no murmurs, rubs, gallops RESPIRATORY:  Clear to auscultation without rales, wheezing or rhonchi  ABDOMEN: Soft, non-tender, non-distended MUSCULOSKELETAL:  No edema; No deformity  SKIN: Warm and dry NEUROLOGIC:  Alert and oriented x 3 PSYCHIATRIC:  Normal affect   ASSESSMENT:    No diagnosis found. PLAN:       1. Orthostatic hypotension: Stelios was having symptoms of orthostatic hypotension while on metoprolol.  He was keeping his blood pressure log and his initial readings looked okay but when we checked his blood pressure cuff against our cuff, it is clear that his blood pressure cuff is reading 20 points too high.  As result, I think that he was actually having low blood pressure readings.  He is feeling much better off the metoprolol.  I have encouraged him to get a new blood pressure cuff.  I will see him on an  as-needed basis.        Medication Adjustments/Labs and Tests Ordered: Current medicines are reviewed at length with the patient today.  Concerns regarding medicines are outlined above.  No orders of the defined types were placed in this encounter.  No orders of the defined types were placed in this encounter.   Patient Instructions  Medication Instructions:  Your physician recommends that you continue on your current medications as directed. Please refer to the Current Medication list given to you today.  *If you need a refill on your cardiac medications before your next appointment, please call your pharmacy*   Lab Work: None today  Testing/Procedures: none   Follow-Up: Your physician recommends that you schedule a follow-up appointment as needed with Dr. Acie Fredrickson.       Signed, Mertie Moores, MD  11/04/2020 9:31 AM    [place]

## 2020-11-04 ENCOUNTER — Encounter: Payer: Self-pay | Admitting: Cardiovascular Disease

## 2020-11-04 ENCOUNTER — Other Ambulatory Visit: Payer: Self-pay

## 2020-11-04 ENCOUNTER — Ambulatory Visit (INDEPENDENT_AMBULATORY_CARE_PROVIDER_SITE_OTHER): Payer: Medicare Other | Admitting: Cardiovascular Disease

## 2020-11-04 DIAGNOSIS — I951 Orthostatic hypotension: Secondary | ICD-10-CM | POA: Insufficient documentation

## 2020-11-04 NOTE — Patient Instructions (Signed)
Medication Instructions:  Your physician recommends that you continue on your current medications as directed. Please refer to the Current Medication list given to you today.  *If you need a refill on your cardiac medications before your next appointment, please call your pharmacy*   Lab Work: None today  Testing/Procedures: none   Follow-Up: Your physician recommends that you schedule a follow-up appointment as needed with Dr. Acie Fredrickson.

## 2020-12-18 ENCOUNTER — Telehealth: Payer: Self-pay | Admitting: Family Medicine

## 2020-12-18 NOTE — Telephone Encounter (Signed)
Paperwork was dropped off and placed in Dr. Barbie Banner folder. It needs to be filled out and faxed.

## 2020-12-18 NOTE — Telephone Encounter (Signed)
Pt form has been received and placed on Dr Sarajane Jews folder for completing

## 2020-12-19 ENCOUNTER — Ambulatory Visit: Payer: Medicare Other | Admitting: Cardiovascular Disease

## 2020-12-23 ENCOUNTER — Telehealth: Payer: Self-pay

## 2020-12-23 NOTE — Telephone Encounter (Signed)
Pt forms for  Resident medical information from [facility] have been completed by Dr Sarajane Jews and faxed to [facility] per pt request, pt has been notified to pick up a copy of the paperwork. A copy was sent to scanning

## 2021-02-02 DIAGNOSIS — L905 Scar conditions and fibrosis of skin: Secondary | ICD-10-CM | POA: Diagnosis not present

## 2021-02-02 DIAGNOSIS — L570 Actinic keratosis: Secondary | ICD-10-CM | POA: Diagnosis not present

## 2021-02-02 DIAGNOSIS — D1801 Hemangioma of skin and subcutaneous tissue: Secondary | ICD-10-CM | POA: Diagnosis not present

## 2021-02-02 DIAGNOSIS — L821 Other seborrheic keratosis: Secondary | ICD-10-CM | POA: Diagnosis not present

## 2021-02-02 DIAGNOSIS — Z85828 Personal history of other malignant neoplasm of skin: Secondary | ICD-10-CM | POA: Diagnosis not present

## 2021-02-02 DIAGNOSIS — D225 Melanocytic nevi of trunk: Secondary | ICD-10-CM | POA: Diagnosis not present

## 2021-02-02 DIAGNOSIS — L111 Transient acantholytic dermatosis [Grover]: Secondary | ICD-10-CM | POA: Diagnosis not present

## 2021-02-02 DIAGNOSIS — L814 Other melanin hyperpigmentation: Secondary | ICD-10-CM | POA: Diagnosis not present

## 2021-04-09 ENCOUNTER — Other Ambulatory Visit: Payer: Self-pay

## 2021-04-10 ENCOUNTER — Encounter: Payer: Self-pay | Admitting: Family Medicine

## 2021-04-10 ENCOUNTER — Ambulatory Visit (INDEPENDENT_AMBULATORY_CARE_PROVIDER_SITE_OTHER): Payer: Medicare Other | Admitting: Family Medicine

## 2021-04-10 VITALS — BP 124/70 | HR 68 | Temp 98.8°F | Wt 164.0 lb

## 2021-04-10 DIAGNOSIS — J019 Acute sinusitis, unspecified: Secondary | ICD-10-CM | POA: Diagnosis not present

## 2021-04-10 DIAGNOSIS — H6123 Impacted cerumen, bilateral: Secondary | ICD-10-CM | POA: Diagnosis not present

## 2021-04-10 MED ORDER — AZITHROMYCIN 250 MG PO TABS: ORAL_TABLET | ORAL | 0 refills | Status: DC

## 2021-04-10 NOTE — Progress Notes (Signed)
   Subjective:    Patient ID: Peter Chandler, male    DOB: 06-Nov-1940, 80 y.o.   MRN: [id number]  HPI Here for several days of sinus pressure, headache, and PND. No fever or cough. Both of his ears are stopped up as well, but he has an appt to see Dr. Redmond Baseman, his ENT, next Monday.    Review of Systems  Constitutional: Negative.   HENT:  Positive for congestion, hearing loss, postnasal drip and sinus pressure. Negative for ear pain and sore throat.   Eyes: Negative.   Respiratory: Negative.        Objective:   Physical Exam Constitutional:      Appearance: Normal appearance.  HENT:     Right Ear: There is impacted cerumen.     Left Ear: There is impacted cerumen.     Nose: Nose normal.     Mouth/Throat:     Pharynx: Oropharynx is clear.  Eyes:     Conjunctiva/sclera: Conjunctivae normal.  Cardiovascular:     Rate and Rhythm: Normal rate and regular rhythm.     Pulses: Normal pulses.     Heart sounds: Normal heart sounds.  Pulmonary:     Effort: Pulmonary effort is normal.     Breath sounds: Normal breath sounds.  Lymphadenopathy:     Cervical: No cervical adenopathy.  Neurological:     Mental Status: He is alert.          Assessment & Plan:  He has a sinusitis, and we will treat this with a Zpack . He will see Dr. Redmond Baseman for the cerumen impactions.  Alysia Penna, MD

## 2021-04-13 DIAGNOSIS — H6123 Impacted cerumen, bilateral: Secondary | ICD-10-CM | POA: Diagnosis not present

## 2021-06-23 DIAGNOSIS — Z23 Encounter for immunization: Secondary | ICD-10-CM | POA: Diagnosis not present

## 2021-08-13 ENCOUNTER — Encounter: Payer: Self-pay | Admitting: Family Medicine

## 2021-08-13 ENCOUNTER — Ambulatory Visit (INDEPENDENT_AMBULATORY_CARE_PROVIDER_SITE_OTHER): Payer: Medicare Other | Admitting: Family Medicine

## 2021-08-13 ENCOUNTER — Other Ambulatory Visit: Payer: Self-pay

## 2021-08-13 VITALS — BP 138/78 | HR 56 | Temp 98.1°F | Ht 67.0 in | Wt 168.4 lb

## 2021-08-13 DIAGNOSIS — N138 Other obstructive and reflux uropathy: Secondary | ICD-10-CM

## 2021-08-13 DIAGNOSIS — R030 Elevated blood-pressure reading, without diagnosis of hypertension: Secondary | ICD-10-CM | POA: Diagnosis not present

## 2021-08-13 DIAGNOSIS — E785 Hyperlipidemia, unspecified: Secondary | ICD-10-CM

## 2021-08-13 DIAGNOSIS — R739 Hyperglycemia, unspecified: Secondary | ICD-10-CM

## 2021-08-13 DIAGNOSIS — N401 Enlarged prostate with lower urinary tract symptoms: Secondary | ICD-10-CM

## 2021-08-13 DIAGNOSIS — K648 Other hemorrhoids: Secondary | ICD-10-CM | POA: Diagnosis not present

## 2021-08-13 DIAGNOSIS — D696 Thrombocytopenia, unspecified: Secondary | ICD-10-CM | POA: Diagnosis not present

## 2021-08-13 DIAGNOSIS — Z23 Encounter for immunization: Secondary | ICD-10-CM

## 2021-08-13 LAB — HEPATIC FUNCTION PANEL
ALT: 11 U/L (ref 0–53)
AST: 15 U/L (ref 0–37)
Albumin: 4.4 g/dL (ref 3.5–5.2)
Alkaline Phosphatase: 51 U/L (ref 39–117)
Bilirubin, Direct: 0.1 mg/dL (ref 0.0–0.3)
Total Bilirubin: 0.8 mg/dL (ref 0.2–1.2)
Total Protein: 7 g/dL (ref 6.0–8.3)

## 2021-08-13 LAB — BASIC METABOLIC PANEL
BUN: 19 mg/dL (ref 6–23)
CO2: 29 mEq/L (ref 19–32)
Calcium: 8.9 mg/dL (ref 8.4–10.5)
Chloride: 104 mEq/L (ref 96–112)
Creatinine, Ser: 1.29 mg/dL (ref 0.40–1.50)
GFR: 51.55 mL/min — ABNORMAL LOW (ref >60.00)
Glucose, Bld: 93 mg/dL (ref 70–99)
Potassium: 4 mEq/L (ref 3.5–5.1)
Sodium: 140 mEq/L (ref 135–145)

## 2021-08-13 LAB — CBC WITH DIFFERENTIAL/PLATELET
Basophils Absolute: 0.1 10*3/uL (ref 0.0–0.1)
Basophils Relative: 1.2 % (ref 0.0–3.0)
Eosinophils Absolute: 0.1 10*3/uL (ref 0.0–0.7)
Eosinophils Relative: 1.8 % (ref 0.0–5.0)
HCT: 41.2 % (ref 39.0–52.0)
Hemoglobin: 13.8 g/dL (ref 13.0–17.0)
Lymphocytes Relative: 39.9 % (ref 12.0–46.0)
Lymphs Abs: 1.8 10*3/uL (ref 0.7–4.0)
MCHC: 33.4 g/dL (ref 30.0–36.0)
MCV: 94.1 fl (ref 78.0–100.0)
Monocytes Absolute: 0.4 10*3/uL (ref 0.1–1.0)
Monocytes Relative: 9.3 % (ref 3.0–12.0)
Neutro Abs: 2.1 10*3/uL (ref 1.4–7.7)
Neutrophils Relative %: 47.8 % (ref 43.0–77.0)
Platelets: 110 10*3/uL — ABNORMAL LOW (ref 150.0–400.0)
RBC: 4.38 Mil/uL (ref 4.22–5.81)
RDW: 14 % (ref 11.5–15.5)
WBC: 4.5 10*3/uL (ref 4.0–10.5)

## 2021-08-13 LAB — LIPID PANEL
Cholesterol: 233 mg/dL — ABNORMAL HIGH (ref 0–200)
HDL: 52.5 mg/dL (ref >39.00)
LDL Cholesterol: 167 mg/dL — ABNORMAL HIGH (ref 0–99)
NonHDL: 180.28
Total CHOL/HDL Ratio: 4
Triglycerides: 66 mg/dL (ref 0.0–149.0)
VLDL: 13.2 mg/dL (ref 0.0–40.0)

## 2021-08-13 LAB — PSA: PSA: 1.31 ng/mL (ref 0.10–4.00)

## 2021-08-13 LAB — HEMOGLOBIN A1C: Hgb A1c MFr Bld: 5.8 % (ref 4.6–6.5)

## 2021-08-13 LAB — TSH: TSH: 1.9 u[IU]/mL (ref 0.35–5.50)

## 2021-08-13 NOTE — Progress Notes (Signed)
Subjective:    Patient ID: Peter Chandler, male    DOB: [birth year], 80 y.o.   MRN: [id number]  HPI Here to follow up on issues. He has no concerns. His BP has been stable. His hemorrhoids have not been a problem. He is urinating fairly freely and he only gets up once a night to urinate.    Review of Systems  Constitutional: Negative.   HENT: Negative.    Eyes: Negative.   Respiratory: Negative.    Cardiovascular: Negative.   Gastrointestinal: Negative.   Genitourinary: Negative.   Musculoskeletal: Negative.   Skin: Negative.   Neurological: Negative.   Psychiatric/Behavioral: Negative.        Objective:   Physical Exam Constitutional:      General: He is not in acute distress.    Appearance: Normal appearance. He is well-developed. He is not diaphoretic.  HENT:     Head: Normocephalic and atraumatic.     Right Ear: External ear normal.     Left Ear: External ear normal.     Nose: Nose normal.     Mouth/Throat:     Pharynx: No oropharyngeal exudate.  Eyes:     General: No scleral icterus.       Right eye: No discharge.        Left eye: No discharge.     Conjunctiva/sclera: Conjunctivae normal.     Pupils: Pupils are equal, round, and reactive to light.  Neck:     Thyroid: No thyromegaly.     Vascular: No JVD.     Trachea: No tracheal deviation.  Cardiovascular:     Rate and Rhythm: Normal rate and regular rhythm.     Heart sounds: Normal heart sounds. No murmur heard.   No friction rub. No gallop.  Pulmonary:     Effort: Pulmonary effort is normal. No respiratory distress.     Breath sounds: Normal breath sounds. No wheezing or rales.  Chest:     Chest wall: No tenderness.  Abdominal:     General: Bowel sounds are normal. There is no distension.     Palpations: Abdomen is soft. There is no mass.     Tenderness: There is no abdominal tenderness. There is no guarding or rebound.  Genitourinary:    Penis: No tenderness.   Musculoskeletal:        General: No  tenderness. Normal range of motion.     Cervical back: Neck supple.  Lymphadenopathy:     Cervical: No cervical adenopathy.  Skin:    General: Skin is warm and dry.     Coloration: Skin is not pale.     Findings: No erythema or rash.  Neurological:     Mental Status: He is alert and oriented to person, place, and time.     Cranial Nerves: No cranial nerve deficit.     Motor: No abnormal muscle tone.     Coordination: Coordination normal.     Deep Tendon Reflexes: Reflexes are normal and symmetric. Reflexes normal.  Psychiatric:        Behavior: Behavior normal.        Thought Content: Thought content normal.        Judgment: Judgment normal.          Assessment & Plan:  His BP and hemorrhoids and BPH are stable. We will get fasting labs to check his lipids, A1c, platelet count, etc. Given flu shot and pneumonia vaccine. We spent a total of (33   )  minutes reviewing records and discussing these issues.  Alysia Penna, MD

## 2021-08-18 DIAGNOSIS — H6123 Impacted cerumen, bilateral: Secondary | ICD-10-CM | POA: Diagnosis not present

## 2021-12-31 DIAGNOSIS — H2513 Age-related nuclear cataract, bilateral: Secondary | ICD-10-CM | POA: Diagnosis not present

## 2021-12-31 DIAGNOSIS — H52203 Unspecified astigmatism, bilateral: Secondary | ICD-10-CM | POA: Diagnosis not present

## 2021-12-31 DIAGNOSIS — H524 Presbyopia: Secondary | ICD-10-CM | POA: Diagnosis not present

## 2021-12-31 DIAGNOSIS — H5203 Hypermetropia, bilateral: Secondary | ICD-10-CM | POA: Diagnosis not present

## 2021-12-31 DIAGNOSIS — H4912 Fourth [trochlear] nerve palsy, left eye: Secondary | ICD-10-CM | POA: Diagnosis not present

## 2021-12-31 DIAGNOSIS — H40023 Open angle with borderline findings, high risk, bilateral: Secondary | ICD-10-CM | POA: Diagnosis not present

## 2021-12-31 DIAGNOSIS — H532 Diplopia: Secondary | ICD-10-CM | POA: Diagnosis not present

## 2022-01-28 DIAGNOSIS — H40023 Open angle with borderline findings, high risk, bilateral: Secondary | ICD-10-CM | POA: Diagnosis not present

## 2022-01-28 DIAGNOSIS — H539 Unspecified visual disturbance: Secondary | ICD-10-CM | POA: Diagnosis not present

## 2022-02-01 ENCOUNTER — Encounter: Payer: Self-pay | Admitting: Family Medicine

## 2022-02-01 ENCOUNTER — Ambulatory Visit (INDEPENDENT_AMBULATORY_CARE_PROVIDER_SITE_OTHER): Payer: Medicare Other | Admitting: Family Medicine

## 2022-02-01 VITALS — BP 126/78 | HR 61 | Temp 98.0°F | Wt 173.0 lb

## 2022-02-01 DIAGNOSIS — H538 Other visual disturbances: Secondary | ICD-10-CM | POA: Diagnosis not present

## 2022-02-01 MED ORDER — ASPIRIN 81 MG PO TBEC: 81.0000 mg | DELAYED_RELEASE_TABLET | Freq: Every day | ORAL | 12 refills | Status: DC

## 2022-02-01 NOTE — Progress Notes (Signed)
? ?  Subjective:  ? ? Patient ID: Peter Chandler, male    DOB: 31-Mar-1941, 81 y.o.   MRN: [id number] ? ?HPI ?Here to discuss an episode of blurred vision that occurred at home 3 weeks ago. At about 10 am that morning while he was working on his computer, the computer screen suddenly became blurry in both eyes and it seems to tilt to the side. This lasted about 3 minutes and then it resolved. This has not happened since thenl there was no headache or any other symptoms at the time. He saw his optometrist last week and asked her about it, and she it was definitely not an eye issue. Of note we worked him up for an episode of double vision in 2016 with a brain MRI that was normal and a carotid US that showed 1-39% stenoses bilaterally. He has not had this since then.  ? ? ?Review of Systems  ?Constitutional: Negative.   ?HENT: Negative.    ?Eyes:  Positive for visual disturbance.  ?Respiratory: Negative.    ?Cardiovascular: Negative.   ?Neurological:  Negative for dizziness, facial asymmetry, speech difficulty, weakness, light-headedness, numbness and headaches.  ? ?   ?Objective:  ? Physical Exam ?Constitutional:   ?   Appearance: Normal appearance. He is not ill-appearing.  ?Eyes:  ?   Extraocular Movements: Extraocular movements intact.  ?   Conjunctiva/sclera: Conjunctivae normal.  ?   Pupils: Pupils are equal, round, and reactive to light.  ?Neck:  ?   Vascular: No carotid bruit.  ?Cardiovascular:  ?   Rate and Rhythm: Normal rate and regular rhythm.  ?   Pulses: Normal pulses.  ?   Heart sounds: Normal heart sounds.  ?Pulmonary:  ?   Effort: Pulmonary effort is normal.  ?   Breath sounds: Normal breath sounds.  ?Lymphadenopathy:  ?   Cervical: No cervical adenopathy.  ?Neurological:  ?   General: No focal deficit present.  ?   Mental Status: He is alert and oriented to person, place, and time. Mental status is at baseline.  ? ? ? ? ? ?   ?Assessment & Plan:  ?Brief episode of blurred vision. It is not clear what the  etiology of this was. This does not fit the classical pattern of amaurosis fugax. However we will set up carotid dopplers soon, and he will begin taking ASA 81 mg daily.  ?Alysia Penna, MD ? ? ?

## 2022-02-02 ENCOUNTER — Ambulatory Visit (HOSPITAL_COMMUNITY)
Admission: RE | Admit: 2022-02-02 | Discharge: 2022-02-02 | Disposition: A | Discharge status: Home / Self Care | Payer: Medicare Other | Source: Ambulatory Visit | Attending: Internal Medicine | Admitting: Internal Medicine

## 2022-02-02 DIAGNOSIS — H538 Other visual disturbances: Secondary | ICD-10-CM | POA: Diagnosis not present

## 2022-02-02 DIAGNOSIS — H53123 Transient visual loss, bilateral: Secondary | ICD-10-CM | POA: Diagnosis not present

## 2022-03-26 DIAGNOSIS — D1801 Hemangioma of skin and subcutaneous tissue: Secondary | ICD-10-CM | POA: Diagnosis not present

## 2022-03-26 DIAGNOSIS — L814 Other melanin hyperpigmentation: Secondary | ICD-10-CM | POA: Diagnosis not present

## 2022-03-26 DIAGNOSIS — Z85828 Personal history of other malignant neoplasm of skin: Secondary | ICD-10-CM | POA: Diagnosis not present

## 2022-03-26 DIAGNOSIS — L821 Other seborrheic keratosis: Secondary | ICD-10-CM | POA: Diagnosis not present

## 2022-03-26 DIAGNOSIS — L300 Nummular dermatitis: Secondary | ICD-10-CM | POA: Diagnosis not present

## 2022-03-26 DIAGNOSIS — Z08 Encounter for follow-up examination after completed treatment for malignant neoplasm: Secondary | ICD-10-CM | POA: Diagnosis not present

## 2022-04-15 DIAGNOSIS — H6123 Impacted cerumen, bilateral: Secondary | ICD-10-CM | POA: Diagnosis not present

## 2022-04-29 DIAGNOSIS — H40023 Open angle with borderline findings, high risk, bilateral: Secondary | ICD-10-CM | POA: Diagnosis not present

## 2022-04-29 DIAGNOSIS — H2513 Age-related nuclear cataract, bilateral: Secondary | ICD-10-CM | POA: Diagnosis not present

## 2022-06-23 ENCOUNTER — Telehealth: Payer: Self-pay | Admitting: Family Medicine

## 2022-06-23 NOTE — Telephone Encounter (Signed)
Spoke with patient to schedule awv  Patient declined did not want to schedule

## 2022-06-24 DIAGNOSIS — L309 Dermatitis, unspecified: Secondary | ICD-10-CM | POA: Diagnosis not present

## 2022-07-06 ENCOUNTER — Encounter: Payer: Medicare Other | Admitting: Family Medicine

## 2022-08-16 ENCOUNTER — Encounter: Payer: Self-pay | Admitting: Family Medicine

## 2022-08-16 ENCOUNTER — Ambulatory Visit (INDEPENDENT_AMBULATORY_CARE_PROVIDER_SITE_OTHER): Payer: Medicare Other | Admitting: Family Medicine

## 2022-08-16 VITALS — BP 120/80 | HR 57 | Temp 98.2°F | Ht 67.0 in | Wt 177.1 lb

## 2022-08-16 DIAGNOSIS — R000 Tachycardia, unspecified: Secondary | ICD-10-CM | POA: Diagnosis not present

## 2022-08-16 DIAGNOSIS — D696 Thrombocytopenia, unspecified: Secondary | ICD-10-CM

## 2022-08-16 DIAGNOSIS — N401 Enlarged prostate with lower urinary tract symptoms: Secondary | ICD-10-CM

## 2022-08-16 DIAGNOSIS — R739 Hyperglycemia, unspecified: Secondary | ICD-10-CM

## 2022-08-16 DIAGNOSIS — N138 Other obstructive and reflux uropathy: Secondary | ICD-10-CM | POA: Diagnosis not present

## 2022-08-16 DIAGNOSIS — L309 Dermatitis, unspecified: Secondary | ICD-10-CM | POA: Insufficient documentation

## 2022-08-16 DIAGNOSIS — Z23 Encounter for immunization: Secondary | ICD-10-CM

## 2022-08-16 DIAGNOSIS — E785 Hyperlipidemia, unspecified: Secondary | ICD-10-CM | POA: Diagnosis not present

## 2022-08-16 DIAGNOSIS — K648 Other hemorrhoids: Secondary | ICD-10-CM | POA: Diagnosis not present

## 2022-08-16 DIAGNOSIS — R030 Elevated blood-pressure reading, without diagnosis of hypertension: Secondary | ICD-10-CM

## 2022-08-16 DIAGNOSIS — I951 Orthostatic hypotension: Secondary | ICD-10-CM | POA: Diagnosis not present

## 2022-08-16 NOTE — Progress Notes (Signed)
Subjective:    Patient ID: Peter Chandler, male    DOB: 1941-08-02, 81 y.o.   MRN: [id number]  HPI Here to follow up on issues. His only concern is dry itchy skin. He has seen his dermatologist and they said he has eczema. He is treating this with Triamcinolone cream on the face and neck, and with Clobetasol everywhere else. His BP is stable. He has not had any rectal bleeding in over a year.    Review of Systems  Constitutional: Negative.   HENT: Negative.    Eyes: Negative.   Respiratory: Negative.    Cardiovascular: Negative.   Gastrointestinal: Negative.   Genitourinary: Negative.   Musculoskeletal: Negative.   Skin:  Positive for rash.  Neurological: Negative.   Psychiatric/Behavioral: Negative.         Objective:   Physical Exam Constitutional:      General: He is not in acute distress.    Appearance: Normal appearance. He is well-developed. He is not diaphoretic.  HENT:     Head: Normocephalic and atraumatic.     Right Ear: External ear normal.     Left Ear: External ear normal.     Nose: Nose normal.     Mouth/Throat:     Pharynx: No oropharyngeal exudate.  Eyes:     General: No scleral icterus.       Right eye: No discharge.        Left eye: No discharge.     Conjunctiva/sclera: Conjunctivae normal.     Pupils: Pupils are equal, round, and reactive to light.  Neck:     Thyroid: No thyromegaly.     Vascular: No JVD.     Trachea: No tracheal deviation.  Cardiovascular:     Rate and Rhythm: Normal rate and regular rhythm.     Heart sounds: Normal heart sounds. No murmur heard.    No friction rub. No gallop.  Pulmonary:     Effort: Pulmonary effort is normal. No respiratory distress.     Breath sounds: Normal breath sounds. No wheezing or rales.  Chest:     Chest wall: No tenderness.  Abdominal:     General: Bowel sounds are normal. There is no distension.     Palpations: Abdomen is soft. There is no mass.     Tenderness: There is no abdominal  tenderness. There is no guarding or rebound.  Genitourinary:    Penis: No tenderness.   Musculoskeletal:        General: No tenderness. Normal range of motion.     Cervical back: Neck supple.  Lymphadenopathy:     Cervical: No cervical adenopathy.  Skin:    General: Skin is warm and dry.     Coloration: Skin is not pale.     Findings: No erythema or rash.  Neurological:     Mental Status: He is alert and oriented to person, place, and time.     Cranial Nerves: No cranial nerve deficit.     Motor: No abnormal muscle tone.     Coordination: Coordination normal.     Deep Tendon Reflexes: Reflexes are normal and symmetric. Reflexes normal.  Psychiatric:        Behavior: Behavior normal.        Thought Content: Thought content normal.        Judgment: Judgment normal.           Assessment & Plan:  He is struggling with eczema, so I encouraged him to continue the  current topical treatments. In addition I explained that the most important thing for his skin is moisture, so he will begin applying Cerave cream to his skin 2-3- times a day. His HTN is stable. His internal hemorrhoids are stable. Set up fasting labs to check lipids, etc. We spent a total of (32   ) minutes reviewing records and discussing these issues.  Alysia Penna, MD  Alysia Penna, MD

## 2022-08-16 NOTE — Addendum Note (Signed)
Addended by: Wyvonne Lenz on: 08/16/2022 01:35 PM   Modules accepted: Orders

## 2022-08-17 DIAGNOSIS — D492 Neoplasm of unspecified behavior of bone, soft tissue, and skin: Secondary | ICD-10-CM | POA: Diagnosis not present

## 2022-08-17 DIAGNOSIS — R21 Rash and other nonspecific skin eruption: Secondary | ICD-10-CM | POA: Diagnosis not present

## 2022-08-17 DIAGNOSIS — L111 Transient acantholytic dermatosis [Grover]: Secondary | ICD-10-CM | POA: Diagnosis not present

## 2022-08-17 DIAGNOSIS — L853 Xerosis cutis: Secondary | ICD-10-CM | POA: Diagnosis not present

## 2022-08-17 DIAGNOSIS — L821 Other seborrheic keratosis: Secondary | ICD-10-CM | POA: Diagnosis not present

## 2022-08-17 DIAGNOSIS — D1801 Hemangioma of skin and subcutaneous tissue: Secondary | ICD-10-CM | POA: Diagnosis not present

## 2022-08-20 DIAGNOSIS — Z23 Encounter for immunization: Secondary | ICD-10-CM | POA: Diagnosis not present

## 2022-08-24 ENCOUNTER — Other Ambulatory Visit (INDEPENDENT_AMBULATORY_CARE_PROVIDER_SITE_OTHER): Payer: Medicare Other

## 2022-08-24 DIAGNOSIS — N138 Other obstructive and reflux uropathy: Secondary | ICD-10-CM | POA: Diagnosis not present

## 2022-08-24 DIAGNOSIS — N401 Enlarged prostate with lower urinary tract symptoms: Secondary | ICD-10-CM

## 2022-08-24 DIAGNOSIS — R739 Hyperglycemia, unspecified: Secondary | ICD-10-CM | POA: Diagnosis not present

## 2022-08-24 DIAGNOSIS — E785 Hyperlipidemia, unspecified: Secondary | ICD-10-CM | POA: Diagnosis not present

## 2022-08-24 LAB — CBC WITH DIFFERENTIAL/PLATELET
Basophils Absolute: 0.1 10*3/uL (ref 0.0–0.1)
Basophils Relative: 1.2 % (ref 0.0–3.0)
Eosinophils Absolute: 0.1 10*3/uL (ref 0.0–0.7)
Eosinophils Relative: 2.3 % (ref 0.0–5.0)
HCT: 39.9 % (ref 39.0–52.0)
Hemoglobin: 13.5 g/dL (ref 13.0–17.0)
Lymphocytes Relative: 42.6 % (ref 12.0–46.0)
Lymphs Abs: 2.4 10*3/uL (ref 0.7–4.0)
MCHC: 33.8 g/dL (ref 30.0–36.0)
MCV: 93.1 fl (ref 78.0–100.0)
Monocytes Absolute: 0.6 10*3/uL (ref 0.1–1.0)
Monocytes Relative: 10.7 % (ref 3.0–12.0)
Neutro Abs: 2.5 10*3/uL (ref 1.4–7.7)
Neutrophils Relative %: 43.2 % (ref 43.0–77.0)
Platelets: 135 10*3/uL — ABNORMAL LOW (ref 150.0–400.0)
RBC: 4.29 Mil/uL (ref 4.22–5.81)
RDW: 14.3 % (ref 11.5–15.5)
WBC: 5.7 10*3/uL (ref 4.0–10.5)

## 2022-08-24 LAB — LIPID PANEL
Cholesterol: 213 mg/dL — ABNORMAL HIGH (ref 0–200)
HDL: 48.4 mg/dL (ref >39.00)
LDL Cholesterol: 151 mg/dL — ABNORMAL HIGH (ref 0–99)
NonHDL: 164.5
Total CHOL/HDL Ratio: 4
Triglycerides: 66 mg/dL (ref 0.0–149.0)
VLDL: 13.2 mg/dL (ref 0.0–40.0)

## 2022-08-24 LAB — TSH: TSH: 2.09 u[IU]/mL (ref 0.35–5.50)

## 2022-08-24 LAB — BASIC METABOLIC PANEL
BUN: 16 mg/dL (ref 6–23)
CO2: 30 mEq/L (ref 19–32)
Calcium: 8.8 mg/dL (ref 8.4–10.5)
Chloride: 104 mEq/L (ref 96–112)
Creatinine, Ser: 1.26 mg/dL (ref 0.40–1.50)
GFR: 52.64 mL/min — ABNORMAL LOW (ref >60.00)
Glucose, Bld: 93 mg/dL (ref 70–99)
Potassium: 4 mEq/L (ref 3.5–5.1)
Sodium: 140 mEq/L (ref 135–145)

## 2022-08-24 LAB — HEPATIC FUNCTION PANEL
ALT: 10 U/L (ref 0–53)
AST: 14 U/L (ref 0–37)
Albumin: 4.2 g/dL (ref 3.5–5.2)
Alkaline Phosphatase: 51 U/L (ref 39–117)
Bilirubin, Direct: 0.1 mg/dL (ref 0.0–0.3)
Total Bilirubin: 0.6 mg/dL (ref 0.2–1.2)
Total Protein: 6.7 g/dL (ref 6.0–8.3)

## 2022-08-24 LAB — PSA: PSA: 1.45 ng/mL (ref 0.10–4.00)

## 2022-08-24 LAB — HEMOGLOBIN A1C: Hgb A1c MFr Bld: 5.9 % (ref 4.6–6.5)

## 2022-09-14 DIAGNOSIS — D1801 Hemangioma of skin and subcutaneous tissue: Secondary | ICD-10-CM | POA: Diagnosis not present

## 2022-09-14 DIAGNOSIS — L821 Other seborrheic keratosis: Secondary | ICD-10-CM | POA: Diagnosis not present

## 2022-09-14 DIAGNOSIS — L111 Transient acantholytic dermatosis [Grover]: Secondary | ICD-10-CM | POA: Diagnosis not present

## 2022-09-23 ENCOUNTER — Encounter: Payer: Self-pay | Admitting: Adult Health

## 2022-09-23 ENCOUNTER — Ambulatory Visit (INDEPENDENT_AMBULATORY_CARE_PROVIDER_SITE_OTHER): Payer: Medicare Other | Admitting: Adult Health

## 2022-09-23 VITALS — BP 132/70 | HR 70 | Temp 98.1°F | Ht 67.0 in | Wt 173.0 lb

## 2022-09-23 DIAGNOSIS — J0141 Acute recurrent pansinusitis: Secondary | ICD-10-CM | POA: Diagnosis not present

## 2022-09-23 MED ORDER — AZITHROMYCIN 250 MG PO TABS: ORAL_TABLET | ORAL | 0 refills | Status: AC

## 2022-09-23 NOTE — Progress Notes (Signed)
Subjective:    Patient ID: Peter Chandler, male    DOB: [birth year], 81 y.o.   MRN: [id number]  Sinusitis This is a new problem. The current episode started in the past 7 days. The problem has been waxing and waning since onset. There has been no fever. Associated symptoms include chills, congestion and sinus pressure. Pertinent negatives include no coughing, headaches or sore throat. Past treatments include saline nose sprays (Motrin). The treatment provided mild relief.      Review of Systems  Constitutional:  Positive for chills.  HENT:  Positive for congestion and sinus pressure. Negative for sore throat.   Respiratory:  Negative for cough.   Neurological:  Negative for headaches.   Past Medical History:  Diagnosis Date   Arthritis    "hands" (05/13/2016)   Basal cell carcinoma    "face, back"   Bleeding per rectum 05/13/2016   BPH (benign prostatic hyperplasia) 09/2015   Cholelithiasis 09/2015   noted on CT done by urology for his gross hematuria w/u--pt was referred to gen surg by Dr. Ralene Muskrat office   Chronic thrombocytopenic purpura ([place])    [name] 9/52/8413   Complication of anesthesia    slow to awaken after anesthesia   Diplopia 2017   left eye.  glasses lens fitted with a prism   Gross hematuria 09/2015   Painless.  cystoscopy ~ 09/2015, Dr Jeffie Pollock.  Conclusion (per pt) is source of blood is the prostate.     Hyperlipidemia     Social History   Socioeconomic History   Marital status: Married    Spouse name: Not on file   Number of children: Not on file   Years of education: Not on file   Highest education level: Not on file  Occupational History   Not on file  Tobacco Use   Smoking status: Never   Smokeless tobacco: Never  Vaping Use   Vaping Use: Never used  Substance and Sexual Activity   Alcohol use: No    Alcohol/week: 0.0 standard drinks of alcohol   Drug use: No   Sexual activity: Yes  Other Topics Concern   Not on file  Social History  Narrative   Lives with wife.  Retired Company secretary.     Social Determinants of Health   Financial Resource Strain: Not on file  Food Insecurity: Not on file  Transportation Needs: Not on file  Physical Activity: Not on file  Stress: Not on file  Social Connections: Not on file  Intimate Partner Violence: Not on file    Past Surgical History:  Procedure Laterality Date   COLONOSCOPY  11/05/2002   Internal hemorrhoids, hyperplastic sigmoid polyp, ascending erythema, descending venous lake, hypertrophied papilla. Dr. Aurelio Jew, [place]   COLONOSCOPY N/A 05/14/2016   per Dr. Carlean Purl, hypertrophied anal papillae but no polyps, no repeats needed    CYSTOSCOPY  ~ 09/2015   to eval gross hematuria.  Dr Jeffie Pollock at [facility] urology.    ENDOSCOPIC RETROGRADE CHOLANGIOPANCREATOGRAPHY (ERCP) WITH PROPOFOL N/A 08/30/2017   Procedure: ENDOSCOPIC RETROGRADE CHOLANGIOPANCREATOGRAPHY (ERCP) WITH PROPOFOL;  Surgeon: Ladene Artist, MD;  Location: [facility];  Service: Endoscopy;  Laterality: N/A;   IR RADIOLOGIST EVAL & MGMT  09/08/2017   IR RADIOLOGIST EVAL & MGMT  09/20/2017   IR RADIOLOGIST EVAL & MGMT  10/06/2017   LAPAROSCOPIC CHOLECYSTECTOMY SINGLE SITE WITH INTRAOPERATIVE CHOLANGIOGRAM N/A 08/10/2017   Procedure: LAPAROSCOPIC CHOLECYSTECTOMY SINGLE SITE WITH INTRAOPERATIVE CHOLANGIOGRAM;  Surgeon: Michael Boston, MD;  Location: [facility];  Service: General;  Laterality: N/A;   right site perc tube  08/24/2017    Family History  Problem Relation Age of Onset   Ovarian cancer Other    Heart disease Other    Heart disease Father 98       Died age 27    Allergies  Allergen Reactions   Penicillins Anaphylaxis    Has patient had a PCN reaction causing immediate rash, facial/tongue/throat swelling, SOB or lightheadedness with hypotension: YES Has patient had a PCN reaction causing severe rash involving mucus membranes or skin necrosis: NO Has patient had a PCN reaction that required hospitalization NO Has  patient had a PCN reaction occurring within the last 10 years: NO If all of the above answers are "NO", then may proceed with Cephalosporin use.    Magnesium-Containing Compounds     anaphylactic shock   Prednisone Hives    Current Outpatient Medications on File Prior to Visit  Medication Sig Dispense Refill   aspirin 81 MG EC tablet Take 1 tablet (81 mg total) by mouth daily. Swallow whole. 30 tablet 12   clobetasol cream (TEMOVATE) 7.12 % Apply 1 Application topically daily.     latanoprost (XALATAN) 0.005 % ophthalmic solution SMARTSIG:In Eye(s)     triamcinolone cream (KENALOG) 0.1 % Apply 1 Application topically as needed.     No current facility-administered medications on file prior to visit.    BP 132/70   Pulse 70   Temp 98.1 F (36.7 C) (Oral)   Ht '5\' 7"'$  (1.702 m)   Wt 173 lb (78.5 kg)   SpO2 99%   BMI 27.10 kg/m       Objective:   Physical Exam Vitals and nursing note reviewed.  Constitutional:      Appearance: Normal appearance.  HENT:     Right Ear: Tympanic membrane, ear canal and external ear normal. There is no impacted cerumen.     Left Ear: Tympanic membrane, ear canal and external ear normal. There is no impacted cerumen.     Nose: Congestion and rhinorrhea present. Rhinorrhea is purulent.     Right Sinus: Maxillary sinus tenderness and frontal sinus tenderness present.     Left Sinus: Maxillary sinus tenderness and frontal sinus tenderness present.     Mouth/Throat:     Mouth: Mucous membranes are moist.     Pharynx: Oropharynx is clear. No oropharyngeal exudate.  Cardiovascular:     Rate and Rhythm: Normal rate and regular rhythm.     Pulses: Normal pulses.     Heart sounds: Normal heart sounds.  Pulmonary:     Effort: Pulmonary effort is normal.     Breath sounds: Normal breath sounds.  Abdominal:     General: Abdomen is flat. Bowel sounds are normal.     Palpations: Abdomen is soft.  Skin:    General: Skin is warm and dry.     Capillary  Refill: Capillary refill takes less than 2 seconds.  Neurological:     General: No focal deficit present.     Mental Status: He is alert and oriented to person, place, and time.       Assessment & Plan:  1. Acute recurrent pansinusitis - Will treat due to signs and symptoms  - Follow up in 2-3 days if not resolving or sooner if needed  - azithromycin (ZITHROMAX) 250 MG tablet; Take 2 tablets on day 1, then 1 tablet daily on days 2 through 5  Dispense: 6 tablet; Refill: 0  Dorothyann Peng, NP

## 2022-11-01 DIAGNOSIS — H2513 Age-related nuclear cataract, bilateral: Secondary | ICD-10-CM | POA: Diagnosis not present

## 2022-11-01 DIAGNOSIS — H401121 Primary open-angle glaucoma, left eye, mild stage: Secondary | ICD-10-CM | POA: Diagnosis not present

## 2022-11-01 DIAGNOSIS — H40023 Open angle with borderline findings, high risk, bilateral: Secondary | ICD-10-CM | POA: Diagnosis not present

## 2023-02-21 ENCOUNTER — Encounter: Payer: Self-pay | Admitting: Family Medicine

## 2023-02-21 ENCOUNTER — Ambulatory Visit (INDEPENDENT_AMBULATORY_CARE_PROVIDER_SITE_OTHER): Payer: Medicare Other | Admitting: Family Medicine

## 2023-02-21 VITALS — BP 126/80 | HR 88 | Temp 98.5°F | Resp 16 | Ht 67.0 in | Wt 172.4 lb

## 2023-02-21 DIAGNOSIS — D696 Thrombocytopenia, unspecified: Secondary | ICD-10-CM

## 2023-02-21 DIAGNOSIS — M549 Dorsalgia, unspecified: Secondary | ICD-10-CM | POA: Diagnosis not present

## 2023-02-21 LAB — URINALYSIS, ROUTINE W REFLEX MICROSCOPIC
Bilirubin Urine: NEGATIVE
Hgb urine dipstick: NEGATIVE
Ketones, ur: NEGATIVE
Leukocytes,Ua: NEGATIVE
Nitrite: NEGATIVE
RBC / HPF: NONE SEEN (ref 0–?)
Specific Gravity, Urine: 1.01 (ref 1.000–1.030)
Total Protein, Urine: NEGATIVE
Urine Glucose: NEGATIVE
Urobilinogen, UA: 0.2 (ref 0.0–1.0)
pH: 6 (ref 5.0–8.0)

## 2023-02-21 LAB — BASIC METABOLIC PANEL
BUN: 19 mg/dL (ref 6–23)
CO2: 29 mEq/L (ref 19–32)
Calcium: 9.2 mg/dL (ref 8.4–10.5)
Chloride: 103 mEq/L (ref 96–112)
Creatinine, Ser: 1.25 mg/dL (ref 0.40–1.50)
GFR: 52.96 mL/min — ABNORMAL LOW (ref >60.00)
Glucose, Bld: 103 mg/dL — ABNORMAL HIGH (ref 70–99)
Potassium: 4 mEq/L (ref 3.5–5.1)
Sodium: 141 mEq/L (ref 135–145)

## 2023-02-21 LAB — CBC
HCT: 41.1 % (ref 39.0–52.0)
Hemoglobin: 14 g/dL (ref 13.0–17.0)
MCHC: 34.1 g/dL (ref 30.0–36.0)
MCV: 94 fl (ref 78.0–100.0)
Platelets: 137 10*3/uL — ABNORMAL LOW (ref 150.0–400.0)
RBC: 4.37 Mil/uL (ref 4.22–5.81)
RDW: 14 % (ref 11.5–15.5)
WBC: 5.5 10*3/uL (ref 4.0–10.5)

## 2023-02-21 MED ORDER — TRAMADOL HCL 50 MG PO TABS: 50.0000 mg | ORAL_TABLET | Freq: Two times a day (BID) | ORAL | 0 refills | Status: AC | PRN

## 2023-02-21 NOTE — Patient Instructions (Addendum)
A few things to remember from today's visit:  Acute right-sided back pain, unspecified back location - Plan: Basic metabolic panel, CBC, Urinalysis, Routine w reflex microscopic, traMADol (ULTRAM) 50 MG tablet  Thrombocytopenia (HCC), Chronic - Plan: CBC  Icy hot patch on affected area may help. Tylenol 500 mg 2-3 times per day. Tramadol was sent to your pharmacy to pick up if needed, take it mainly at night. Monitor for rash.  If you need refills for medications you take chronically, please call your pharmacy. Do not use My Chart to request refills or for acute issues that need immediate attention. If you send a my chart message, it may take a few days to be addressed, specially if I am not in the office.  Please be sure medication list is accurate. If a new problem present, please set up appointment sooner than planned today.

## 2023-02-21 NOTE — Progress Notes (Signed)
ACUTE VISIT Chief Complaint  Patient presents with   Flank Pain   Back Pain    X a week, pain when moving certain ways, almost feels like a spasm.    HPI: Peter Chandler is a 82 y.o. male with past medical history significant for OA, thrombocytopenia, BPH, and hyperlipidemia here today complaining of severe back pain, rating it as an 8 out of 10, localized in the lower thoracic and lumbar regions on the right side. He describes the pain as spasm-like and more intense than previous episodes, which have occurred sporadically over time Back Pain This is a recurrent problem. The current episode started in the past 7 days. The problem is unchanged. The pain is present in the lumbar spine and thoracic spine. The pain is severe. The symptoms are aggravated by lying down. Pertinent negatives include no abdominal pain, bladder incontinence, bowel incontinence, chest pain, dysuria, headaches, leg pain, numbness, paresthesias, pelvic pain, perianal numbness, weakness or weight loss. He has tried NSAIDs for the symptoms.  .  The pain persists even when he is sitting still, though bending does not exacerbate it but certain movements when in bed aggravate pain.   He denies any associated symptoms such as fever, chills, changes in appetite, cough, wheezing, difficulty breathing, abdominal pain, nausea, or vomiting.   His last bowel movement was normal today and did not affect the pain.  He denies associated urinary symptoms.  He mentions a long-standing issue with a "bleeding prostate", characterized by occasional brown blood in his urine, a condition that has been going on for about ten years and evaluated by urologist.    He took ibuprofen for the pain on Saturday night but has not taken any since.   Additionally, he reports that he is under treatment for skin lesions, managed by a dermatologist with two prescribed topical medications.  He has a history of thrombocytopenia. Lab Results   Component Value Date   WBC 5.7 08/24/2022   HGB 13.5 08/24/2022   HCT 39.9 08/24/2022   MCV 93.1 08/24/2022   PLT 135.0 (L) 08/24/2022   Lab Results  Component Value Date   CREATININE 1.26 08/24/2022   BUN 16 08/24/2022   NA 140 08/24/2022   K 4.0 08/24/2022   CL 104 08/24/2022   CO2 30 08/24/2022   Review of Systems  Constitutional:  Negative for weight loss.  HENT:  Negative for mouth sores and sore throat.   Cardiovascular:  Negative for chest pain.  Gastrointestinal:  Negative for abdominal pain and bowel incontinence.  Genitourinary:  Negative for bladder incontinence, decreased urine volume, dysuria, hematuria and pelvic pain.  Musculoskeletal:  Positive for back pain.  Neurological:  Negative for weakness, numbness, headaches and paresthesias.  See other pertinent positives and negatives in HPI.  Current Outpatient Medications on File Prior to Visit  Medication Sig Dispense Refill   clobetasol cream (TEMOVATE) 0.05 % Apply 1 Application topically daily.     latanoprost (XALATAN) 0.005 % ophthalmic solution SMARTSIG:In Eye(s)     triamcinolone cream (KENALOG) 0.1 % Apply 1 Application topically as needed.     No current facility-administered medications on file prior to visit.   Past Medical History:  Diagnosis Date   Arthritis    "hands" (05/13/2016)   Basal cell carcinoma    "face, back"   Bleeding per rectum 05/13/2016   BPH (benign prostatic hyperplasia) 09/2015   Cholelithiasis 09/2015   noted on CT done by urology for his gross hematuria w/u--pt  was referred to gen surg by Dr. Belva Crome office   Chronic thrombocytopenic purpura (HCC)    Hattie Perch 05/13/2016   Complication of anesthesia    slow to awaken after anesthesia   Diplopia 2017   left eye.  glasses lens fitted with a prism   Gross hematuria 09/2015   Painless.  cystoscopy ~ 09/2015, Dr Annabell Howells.  Conclusion (per pt) is source of blood is the prostate.     Hyperlipidemia    Allergies  Allergen  Reactions   Penicillins Anaphylaxis    Has patient had a PCN reaction causing immediate rash, facial/tongue/throat swelling, SOB or lightheadedness with hypotension: YES Has patient had a PCN reaction causing severe rash involving mucus membranes or skin necrosis: NO Has patient had a PCN reaction that required hospitalization NO Has patient had a PCN reaction occurring within the last 10 years: NO If all of the above answers are "NO", then may proceed with Cephalosporin use.    Magnesium-Containing Compounds     anaphylactic shock   Prednisone Hives   Social History   Socioeconomic History   Marital status: Married    Spouse name: Not on file   Number of children: Not on file   Years of education: Not on file   Highest education level: Not on file  Occupational History   Not on file  Tobacco Use   Smoking status: Never   Smokeless tobacco: Never  Vaping Use   Vaping Use: Never used  Substance and Sexual Activity   Alcohol use: No    Alcohol/week: 0.0 standard drinks of alcohol   Drug use: No   Sexual activity: Yes  Other Topics Concern   Not on file  Social History Narrative   Lives with wife.  Retired Optician, dispensing.     Social Determinants of Health   Financial Resource Strain: Not on file  Food Insecurity: Not on file  Transportation Needs: Not on file  Physical Activity: Not on file  Stress: Not on file  Social Connections: Not on file   Vitals:   02/21/23 0858  BP: 126/80  Pulse: 88  Resp: 16  Temp: 98.5 F (36.9 C)  SpO2: 98%   Body mass index is 27 kg/m.  Physical Exam Vitals and nursing note reviewed.  Constitutional:      General: He is not in acute distress.    Appearance: He is well-developed.  HENT:     Head: Normocephalic and atraumatic.  Eyes:     Conjunctiva/sclera: Conjunctivae normal.  Cardiovascular:     Rate and Rhythm: Normal rate and regular rhythm.     Heart sounds: No murmur heard.    Comments: Trace pitting LE edema,  bilateral. Pulmonary:     Effort: Pulmonary effort is normal. No respiratory distress.     Breath sounds: Normal breath sounds.  Abdominal:     Palpations: Abdomen is soft. There is no mass.     Tenderness: There is no abdominal tenderness.  Musculoskeletal:     Lumbar back: No tenderness or bony tenderness.       Back:  Skin:    General: Skin is warm.     Findings: Ecchymosis (A couple on right-sided lower back.) and rash (Scattered erythematous papular lesions on lower back that do not follow dermatomal pattern.) present. No erythema.     Comments:    Neurological:     Mental Status: He is alert and oriented to person, place, and time.     Cranial Nerves: No cranial  nerve deficit.     Gait: Gait normal.  Psychiatric:        Mood and Affect: Mood and affect normal.   ASSESSMENT AND PLAN:  Peter Chandler was seen today for flank pain and back pain.  Diagnoses and all orders for this visit:  Acute right-sided back pain, unspecified back location He is concerned about renal etiologies. Hx and examination do not suggest a serious process. He has had mildly abnormal e GFR, it was 52.64 in 08/2022 and 51 in 08/2021 with Cr 1.29. Recommend avoiding NSAID's and continue adequate hydration. I think pain is muscular, I do not think imaging is needed at this time. Recommend topical treatment with OTC IcyHot patch. Further recommendation will be given according to lab results.  -     Basic metabolic panel; Future -     CBC; Future -     Urinalysis, Routine w reflex microscopic; Future -     traMADol (ULTRAM) 50 MG tablet; Take 1 tablet (50 mg total) by mouth every 12 (twelve) hours as needed for up to 3 days. -     Urinalysis, Routine w reflex microscopic -     CBC -     Basic metabolic panel  Thrombocytopenia (HCC) Since 07/2019 110-137,000. He has a few ecchymosis on his lower back, which he attributes to Aspirin use. No other signs of bleeding. Further recommendation will be given  according to CBC results.  -     CBC  Return if symptoms worsen or fail to improve.  Treyten Monestime G. [place], MD  [facility]. [place] office.

## 2023-05-02 DIAGNOSIS — H4912 Fourth [trochlear] nerve palsy, left eye: Secondary | ICD-10-CM | POA: Diagnosis not present

## 2023-05-02 DIAGNOSIS — H40021 Open angle with borderline findings, high risk, right eye: Secondary | ICD-10-CM | POA: Diagnosis not present

## 2023-05-02 DIAGNOSIS — H401121 Primary open-angle glaucoma, left eye, mild stage: Secondary | ICD-10-CM | POA: Diagnosis not present

## 2023-05-03 ENCOUNTER — Telehealth: Payer: Self-pay | Admitting: Family Medicine

## 2023-05-03 NOTE — Telephone Encounter (Signed)
The diagnosis of MG involves an exam and a series of tests which are done by a neurologist. I would be happy to refer him to Neurology if he wants

## 2023-05-03 NOTE — Telephone Encounter (Signed)
Dr. Shearon Stalls ([facility]) [phone]  Optometrist told Pt he needs a Myasthenia Gravis blood test, and he should reach out to MD.   Please advise.

## 2023-05-05 NOTE — Telephone Encounter (Signed)
Patient is aware and declines the referral at this time.

## 2023-05-31 DIAGNOSIS — H6123 Impacted cerumen, bilateral: Secondary | ICD-10-CM | POA: Diagnosis not present

## 2023-06-14 DIAGNOSIS — L111 Transient acantholytic dermatosis [Grover]: Secondary | ICD-10-CM | POA: Diagnosis not present

## 2023-06-28 DIAGNOSIS — H5053 Vertical heterophoria: Secondary | ICD-10-CM | POA: Diagnosis not present

## 2023-08-16 DIAGNOSIS — L111 Transient acantholytic dermatosis [Grover]: Secondary | ICD-10-CM | POA: Diagnosis not present

## 2023-11-02 DIAGNOSIS — H4912 Fourth [trochlear] nerve palsy, left eye: Secondary | ICD-10-CM | POA: Diagnosis not present

## 2023-11-02 DIAGNOSIS — H40021 Open angle with borderline findings, high risk, right eye: Secondary | ICD-10-CM | POA: Diagnosis not present

## 2023-11-02 DIAGNOSIS — H401121 Primary open-angle glaucoma, left eye, mild stage: Secondary | ICD-10-CM | POA: Diagnosis not present

## 2023-11-02 DIAGNOSIS — H2513 Age-related nuclear cataract, bilateral: Secondary | ICD-10-CM | POA: Diagnosis not present

## 2023-11-12 DIAGNOSIS — Z23 Encounter for immunization: Secondary | ICD-10-CM | POA: Diagnosis not present

## 2023-12-13 DIAGNOSIS — H6123 Impacted cerumen, bilateral: Secondary | ICD-10-CM | POA: Diagnosis not present

## 2024-01-16 DIAGNOSIS — H2513 Age-related nuclear cataract, bilateral: Secondary | ICD-10-CM | POA: Diagnosis not present

## 2024-01-16 DIAGNOSIS — H5212 Myopia, left eye: Secondary | ICD-10-CM | POA: Diagnosis not present

## 2024-02-20 ENCOUNTER — Telehealth: Payer: Self-pay

## 2024-02-20 NOTE — Telephone Encounter (Signed)
   Pre-operative Risk Assessment    Patient Name: Peter Chandler  DOB: [birth year] MRN: [id number]   Date of last office visit: 11/04/20 Ahmad Alert, MD Date of next office visit: NONE   Request for Surgical Clearance    Procedure:  DENTAL CLEANING  Date of Surgery:  Clearance TBD                                Surgeon:  NOT INDICATED Surgeon's Group or Practice Name:  [facility] Phone number:  [phone] Fax number:  [phone]   Type of Clearance Requested:   - Medical    Type of Anesthesia:  Not Indicated   Additional requests/questions:    SignedCollin Deal   02/20/2024, 1:23 PM

## 2024-02-20 NOTE — Telephone Encounter (Signed)
   Patient Name: Peter Chandler  DOB: Aug 18, 1941 MRN: [id number]  Primary Cardiologist: None  Chart reviewed as part of pre-operative protocol coverage.   Patient has not been seen by cardiology service since 11/04/2020.  It has been greater than 3 years since he has been seen in office.  Unable to give medical clearance at this time as requested.  Please reach out to patient's primary care provider for medical clearance.  Ava Boatman, NP 02/20/2024, 4:58 PM

## 2024-02-23 DIAGNOSIS — Z961 Presence of intraocular lens: Secondary | ICD-10-CM | POA: Diagnosis not present

## 2024-02-23 DIAGNOSIS — H25811 Combined forms of age-related cataract, right eye: Secondary | ICD-10-CM | POA: Diagnosis not present

## 2024-02-23 DIAGNOSIS — H2511 Age-related nuclear cataract, right eye: Secondary | ICD-10-CM | POA: Diagnosis not present

## 2024-04-04 ENCOUNTER — Ambulatory Visit: Admitting: Family Medicine

## 2024-04-04 ENCOUNTER — Encounter: Payer: Self-pay | Admitting: Family Medicine

## 2024-04-04 VITALS — BP 126/64 | HR 61 | Temp 98.0°F | Wt 163.9 lb

## 2024-04-04 DIAGNOSIS — J019 Acute sinusitis, unspecified: Secondary | ICD-10-CM | POA: Diagnosis not present

## 2024-04-04 MED ORDER — AZITHROMYCIN 250 MG PO TABS: ORAL_TABLET | ORAL | 0 refills | Status: DC

## 2024-04-04 NOTE — Progress Notes (Signed)
   Subjective:    Patient ID: Peter Chandler, male    DOB: 1941/04/28, 83 y.o.   MRN: [id number]  HPI Here for 3 days of head congestion, PND, and a dry cough. No fever or ST. Drinking fluids.    Review of Systems  Constitutional: Negative.   HENT:  Positive for congestion, postnasal drip and sinus pressure. Negative for ear pain and sore throat.   Eyes: Negative.   Respiratory:  Positive for cough. Negative for shortness of breath and wheezing.        Objective:   Physical Exam Constitutional:      Appearance: Normal appearance.  HENT:     Right Ear: Tympanic membrane, ear canal and external ear normal.     Left Ear: Tympanic membrane, ear canal and external ear normal.     Nose: Nose normal.     Mouth/Throat:     Pharynx: Oropharynx is clear.  Eyes:     Conjunctiva/sclera: Conjunctivae normal.  Pulmonary:     Effort: Pulmonary effort is normal.     Breath sounds: Normal breath sounds.  Lymphadenopathy:     Cervical: No cervical adenopathy.  Neurological:     Mental Status: He is alert.           Assessment & Plan:  Sinusitis, treat with a Zpack.  Garnette Olmsted, MD

## 2024-04-16 ENCOUNTER — Ambulatory Visit: Payer: Self-pay

## 2024-04-16 NOTE — Telephone Encounter (Signed)
 States called the office and spoke with MA; she made him an appointment for tomorrow am; declined triage.  sinus infection hasn't gone away.. fluid coming out of nose.Peter Chandler

## 2024-04-17 ENCOUNTER — Encounter: Payer: Self-pay | Admitting: Family Medicine

## 2024-04-17 ENCOUNTER — Ambulatory Visit (INDEPENDENT_AMBULATORY_CARE_PROVIDER_SITE_OTHER): Admitting: Family Medicine

## 2024-04-17 VITALS — BP 130/78 | HR 63 | Temp 98.0°F | Wt 163.6 lb

## 2024-04-17 DIAGNOSIS — J019 Acute sinusitis, unspecified: Secondary | ICD-10-CM

## 2024-04-17 MED ORDER — DOXYCYCLINE HYCLATE 100 MG PO TABS: 100.0000 mg | ORAL_TABLET | Freq: Two times a day (BID) | ORAL | 0 refills | Status: DC

## 2024-04-17 NOTE — Progress Notes (Signed)
   Subjective:    Patient ID: BEDFORD WINSOR, male    DOB: [birth year], 83 y.o.   MRN: [id number]  HPI Here for continued sinus symptoms. He was here on 04-04-24 for a sinusitis, and he was given a Zpack. This helped him quite a bit, but he still has some sinus congestion and PND. No St or fever or cough. He is concerned because he is scheduled for cataract surgery on 04-27-24.    Review of Systems  Constitutional: Negative.   HENT:  Positive for congestion, postnasal drip and sinus pressure. Negative for ear pain and sore throat.   Eyes: Negative.   Respiratory: Negative.         Objective:   Physical Exam Constitutional:      Appearance: Normal appearance.  HENT:     Right Ear: Tympanic membrane, ear canal and external ear normal.     Left Ear: Tympanic membrane, ear canal and external ear normal.     Nose: Nose normal.     Mouth/Throat:     Pharynx: Oropharynx is clear.  Eyes:     Conjunctiva/sclera: Conjunctivae normal.  Pulmonary:     Effort: Pulmonary effort is normal.     Breath sounds: Normal breath sounds.  Lymphadenopathy:     Cervical: No cervical adenopathy.  Neurological:     Mental Status: He is alert.           Assessment & Plan:  Partially treated sinusitis. We will treat with 10 days of Doxycycline .  Garnette Olmsted, MD

## 2024-04-17 NOTE — Telephone Encounter (Signed)
 Pt was seen on 04/16/24 by Dr Johnny for this problem

## 2024-04-26 DIAGNOSIS — H2512 Age-related nuclear cataract, left eye: Secondary | ICD-10-CM | POA: Diagnosis not present

## 2024-04-26 DIAGNOSIS — Z961 Presence of intraocular lens: Secondary | ICD-10-CM | POA: Diagnosis not present

## 2024-05-31 DIAGNOSIS — H6123 Impacted cerumen, bilateral: Secondary | ICD-10-CM | POA: Diagnosis not present

## 2024-06-20 ENCOUNTER — Ambulatory Visit (INDEPENDENT_AMBULATORY_CARE_PROVIDER_SITE_OTHER)

## 2024-06-20 VITALS — BP 120/60 | HR 60 | Temp 98.6°F | Ht 67.5 in | Wt 169.4 lb

## 2024-06-20 DIAGNOSIS — Z0000 Encounter for general adult medical examination without abnormal findings: Secondary | ICD-10-CM | POA: Diagnosis not present

## 2024-06-20 NOTE — Progress Notes (Signed)
 Subjective:   Peter Chandler is a 83 y.o. who presents for a Medicare Wellness preventive visit.  As a reminder, Annual Wellness Visits don't include a physical exam, and some assessments may be limited, especially if this visit is performed virtually. We may recommend an in-person follow-up visit with your provider if needed.  Visit Complete: In person    Persons Participating in Visit: Patient.  AWV Questionnaire: No: Patient Medicare AWV questionnaire was not completed prior to this visit.  Cardiac Risk Factors include: advanced age (>80men, >19 women);male gender     Objective:    Today's Vitals   06/20/24 0829  BP: 120/60  Pulse: 60  Temp: 98.6 F (37 C)  TempSrc: Oral  SpO2: 97%  Weight: 169 lb 6.4 oz (76.8 kg)  Height: 5' 7.5 (1.715 m)   Body mass index is 26.14 kg/m.     06/20/2024    8:44 AM 10/11/2017   11:06 AM 08/30/2017    9:03 AM 08/29/2017   12:53 PM 08/26/2017    4:11 PM 08/26/2017   12:11 PM 08/24/2017   11:14 AM  Advanced Directives  Does Patient Have a Medical Advance Directive? Yes Yes  Yes  No   Yes  Yes   Type of Estate agent of [place];Living will Healthcare Power of [place];Living will Healthcare Power of [place];Living will   Healthcare Power of [place];Living will Healthcare Power of [place];Living will  Does patient want to make changes to medical advance directive?     No - Patient declined     Copy of Healthcare Power of Attorney in Chart? No - copy requested Yes      No - copy requested   Would patient like information on creating a medical advance directive?    No - Patient declined         Data saved with a previous flowsheet row definition    Current Medications (verified) Outpatient Encounter Medications as of 06/20/2024  Medication Sig   clobetasol cream (TEMOVATE) 0.05 % Apply 1 Application topically daily.   doxycycline  (VIBRA -TABS) 100 MG tablet Take 1 tablet (100 mg total) by mouth 2 (two)  times daily. (Patient not taking: Reported on 06/20/2024)   latanoprost (XALATAN) 0.005 % ophthalmic solution SMARTSIG:In Eye(s)   triamcinolone cream (KENALOG) 0.1 % Apply 1 Application topically as needed.   No facility-administered encounter medications on file as of 06/20/2024.    Allergies (verified) Penicillins, Magnesium-containing compounds, and Prednisone   History: Past Medical History:  Diagnosis Date   Arthritis    hands (05/13/2016)   Basal cell carcinoma    face, back   Bleeding per rectum 05/13/2016   BPH (benign prostatic hyperplasia) 09/2015   Cholelithiasis 09/2015   noted on CT done by urology for his gross hematuria w/u--pt was referred to gen surg by Dr. Maynard office   Chronic thrombocytopenic purpura (HCC)    thelbert 05/13/2016   Complication of anesthesia    slow to awaken after anesthesia   Diplopia 2017   left eye.  glasses lens fitted with a prism    Gross hematuria 09/2015   Painless.  cystoscopy ~ 09/2015, Dr Watt.  Conclusion (per pt) is source of blood is the prostate.     Hyperlipidemia    Past Surgical History:  Procedure Laterality Date   COLONOSCOPY  11/05/2002   Internal hemorrhoids, hyperplastic sigmoid polyp, ascending erythema, descending venous lake, hypertrophied papilla. Dr. Leotha Distel, [place]   COLONOSCOPY N/A 05/14/2016   per Dr.  Gessner, hypertrophied anal papillae but no polyps, no repeats needed    CYSTOSCOPY  ~ 09/2015   to eval gross hematuria.  Dr Watt at [facility] urology.    ENDOSCOPIC RETROGRADE CHOLANGIOPANCREATOGRAPHY (ERCP) WITH PROPOFOL  N/A 08/30/2017   Procedure: ENDOSCOPIC RETROGRADE CHOLANGIOPANCREATOGRAPHY (ERCP) WITH PROPOFOL ;  Surgeon: Aneita Gwendlyn DASEN, MD;  Location: [facility] ENDOSCOPY;  Service: Endoscopy;  Laterality: N/A;   IR RADIOLOGIST EVAL & MGMT  09/08/2017   IR RADIOLOGIST EVAL & MGMT  09/20/2017   IR RADIOLOGIST EVAL & MGMT  10/06/2017   LAPAROSCOPIC CHOLECYSTECTOMY SINGLE SITE WITH INTRAOPERATIVE CHOLANGIOGRAM N/A  08/10/2017   Procedure: LAPAROSCOPIC CHOLECYSTECTOMY SINGLE SITE WITH INTRAOPERATIVE CHOLANGIOGRAM;  Surgeon: Sheldon Standing, MD;  Location: [facility];  Service: General;  Laterality: N/A;   right site perc tube  08/24/2017   Family History  Problem Relation Age of Onset   Ovarian cancer Other    Heart disease Other    Heart disease Father 63       Died age 35   Social History   Socioeconomic History   Marital status: Married    Spouse name: Not on file   Number of children: Not on file   Years of education: Not on file   Highest education level: Not on file  Occupational History   Not on file  Tobacco Use   Smoking status: Never   Smokeless tobacco: Never  Vaping Use   Vaping status: Never Used  Substance and Sexual Activity   Alcohol use: No    Alcohol/week: 0.0 standard drinks of alcohol   Drug use: No   Sexual activity: Yes  Other Topics Concern   Not on file  Social History Narrative   Lives with wife.  Retired Optician, dispensing.     Social Drivers of Corporate investment banker Strain: Low Risk  (06/20/2024)   Overall Financial Resource Strain (CARDIA)    Difficulty of Paying Living Expenses: Not hard at all  Food Insecurity: No Food Insecurity (06/20/2024)   Hunger Vital Sign    Worried About Running Out of Food in the Last Year: Never true    Ran Out of Food in the Last Year: Never true  Transportation Needs: No Transportation Needs (06/20/2024)   PRAPARE - Administrator, Civil Service (Medical): No    Lack of Transportation (Non-Medical): No  Physical Activity: Inactive (06/20/2024)   Exercise Vital Sign    Days of Exercise per Week: 0 days    Minutes of Exercise per Session: 0 min  Stress: No Stress Concern Present (06/20/2024)   [employer] of Occupational Health - Occupational Stress Questionnaire    Feeling of Stress: Not at all  Social Connections: Socially Integrated (06/20/2024)   Social Connection and Isolation Panel    Frequency of  Communication with Friends and Family: More than three times a week    Frequency of Social Gatherings with Friends and Family: More than three times a week    Attends Religious Services: More than 4 times per year    Active Member of [employer] or Organizations: Yes    Attends Engineer, structural: More than 4 times per year    Marital Status: Married    Tobacco Counseling Counseling given: Not Answered    Clinical Intake:  Pre-visit preparation completed: Yes  Pain : No/denies pain     BMI - recorded: 26.14 Nutritional Status: BMI 25 -29 Overweight Nutritional Risks: None Diabetes: No  Lab Results  Component Value Date  HGBA1C 5.9 08/24/2022   HGBA1C 5.8 08/13/2021     How often do you need to have someone help you when you read instructions, pamphlets, or other written materials from your doctor or pharmacy?: 1 - Never  Interpreter Needed?: No  Information entered by :: Rojelio Blush LPN   Activities of Daily Living     06/20/2024    8:42 AM  In your present state of health, do you have any difficulty performing the following activities:  Hearing? 0  Vision? 0  Difficulty concentrating or making decisions? 0  Walking or climbing stairs? 0  Dressing or bathing? 0  Doing errands, shopping? 0  Preparing Food and eating ? N  Using the Toilet? N  In the past six months, have you accidently leaked urine? N  Do you have problems with loss of bowel control? N  Managing your Medications? N  Managing your Finances? N  Housekeeping or managing your Housekeeping? N    Patient Care Team: Johnny Garnette LABOR, MD as PCP - Died[place] Sheldon Standing, MD as Consulting Physician (General Surgery) Watt Rush, MD as Attending Physician (Urology) Avram Lupita BRAVO, MD as Consulting Physician (Gastroenterology) Marya Sharper, MD as Consulting Physician (Gastroenterology)  I have updated your Care Teams any recent Medical Services you may have received from other providers  in the past year.     Assessment:   This is a routine wellness examination for [place].  Hearing/Vision screen Hearing Screening - Comments:: Denies hearing difficulties   Vision Screening - Comments:: Wears rx glasses - up to date with routine eye exams with  [facility]   Goals Addressed               This Visit's Progress     Increase physical activity (pt-stated)        Get more active.       Depression Screen     06/20/2024    8:29 AM 04/17/2024    8:23 AM 02/21/2023    9:04 AM 08/16/2022    8:40 AM 02/01/2022    9:00 AM 08/13/2021    8:55 AM 07/11/2019    7:55 AM  PHQ 2/9 Scores  PHQ - 2 Score 0 0 0 0 0 0 0  PHQ- 9 Score  0  0 0 0     Fall Risk     06/20/2024    8:43 AM 04/17/2024    8:22 AM 02/21/2023    9:04 AM 08/16/2022    8:39 AM 02/01/2022    9:00 AM  Fall Risk   Falls in the past year? 0 0 0 0 0  Number falls in past yr: 0 0 0 0 0  Injury with Fall? 0 0 0 0 0  Risk for fall due to : No Fall Risks No Fall Risks Other (Comment) No Fall Risks No Fall Risks  Follow up Falls evaluation completed Falls evaluation completed Falls evaluation completed Falls evaluation completed  Falls evaluation completed      Data saved with a previous flowsheet row definition    MEDICARE RISK AT HOME:  Medicare Risk at Home Any stairs in or around the home?: No If so, are there any without handrails?: No Home free of loose throw rugs in walkways, pet beds, electrical cords, etc?: Yes Adequate lighting in your home to reduce risk of falls?: Yes Life alert?: No Use of a cane, walker or w/c?: No Grab bars in the bathroom?: Yes Shower chair or bench in  shower?: Yes Elevated toilet seat or a handicapped toilet?: Yes  TIMED UP AND GO:  Was the test performed?  Yes  Length of time to ambulate 10 feet: 10 sec Gait steady and fast without use of assistive device  Cognitive Function: 6CIT completed        06/20/2024    8:44 AM  6CIT Screen  What Year? 0 points   What month? 0 points  What time? 0 points  Count back from 20 0 points  Months in reverse 0 points  Repeat phrase 0 points  Total Score 0 points    Immunizations Immunization History  Administered Date(s) Administered   Fluad Quad(high Dose 65+) 07/11/2019, 07/16/2020, 08/13/2021, 08/16/2022   INFLUENZA, HIGH DOSE SEASONAL PF 11/28/2018   Influenza-Unspecified 07/04/2020   PFIZER(Purple Top)SARS-COV-2 Vaccination 10/26/2019, 11/16/2019, 07/20/2020   Pfizer Covid-19 Vaccine Bivalent Booster 28yrs & up 07/29/2021   Pneumococcal Conjugate-13 07/11/2019   Pneumococcal Polysaccharide-23 08/13/2021    Screening Tests Health Maintenance  Topic Date Due   DTaP/Tdap/Td (1 - Tdap) Never done   Zoster Vaccines- Shingrix (1 of 2) Never done   Influenza Vaccine  05/04/2024   COVID-19 Vaccine (5 - 2025-26 season) 06/04/2024   Medicare Annual Wellness (AWV)  06/20/2025   Pneumococcal Vaccine: 50+ Years  Completed   HPV VACCINES  Aged Out   Meningococcal B Vaccine  Aged Out    Health Maintenance Items Addressed:   Additional Screening:  Vision Screening: Recommended annual ophthalmology exams for early detection of glaucoma and other disorders of the eye. Is the patient up to date with their annual eye exam?  Yes  Who is the provider or what is the name of the office in which the patient attends annual eye exams? [facility]  Dental Screening: Recommended annual dental exams for proper oral hygiene  Community Resource Referral / Chronic Care Management: CRR required this visit?  No   CCM required this visit?  No   Plan:    I have personally reviewed and noted the following in the patient's chart:   Medical and social history Use of alcohol, tobacco or illicit drugs  Current medications and supplements including opioid prescriptions. Patient is not currently taking opioid prescriptions. Functional ability and status Nutritional status Physical activity Advanced  directives List of other physicians Hospitalizations, surgeries, and ER visits in previous 12 months Vitals Screenings to include cognitive, depression, and falls Referrals and appointments  In addition, I have reviewed and discussed with patient certain preventive protocols, quality metrics, and best practice recommendations. A written personalized care plan for preventive services as well as general preventive health recommendations were provided to patient.   Rojelio LELON Blush, LPN   [id number]   After Visit Summary: (In Person-Printed) AVS printed and given to the patient  Notes: Nothing significant to report at this time.

## 2024-06-20 NOTE — Patient Instructions (Addendum)
 [name],  Thank you for taking the time for your Medicare Wellness Visit. I appreciate your continued commitment to your health goals. Please review the care plan we discussed, and feel free to reach out if I can assist you further.  Medicare recommends these wellness visits once per year to help you and your care team stay ahead of potential health issues. These visits are designed to focus on prevention, allowing your provider to concentrate on managing your acute and chronic conditions during your regular appointments.  Please note that Annual Wellness Visits do not include a physical exam. Some assessments may be limited, especially if the visit was conducted virtually. If needed, we may recommend a separate in-person follow-up with your provider.  Ongoing Care Seeing your primary care provider every 3 to 6 months helps us  monitor your health and provide consistent, personalized care.   Referrals If a referral was made during today's visit and you haven't received any updates within two weeks, please contact the referred provider directly to check on the status.  Recommended Screenings:  Health Maintenance  Topic Date Due   DTaP/Tdap/Td vaccine (1 - Tdap) Never done   Zoster (Shingles) Vaccine (1 of 2) Never done   Flu Shot  05/04/2024   COVID-19 Vaccine (5 - 2025-26 season) 06/04/2024   Medicare Annual Wellness Visit  06/20/2025   Pneumococcal Vaccine for age over 6  Completed   HPV Vaccine  Aged Out   Meningitis B Vaccine  Aged Out       06/20/2024    8:44 AM  Advanced Directives  Does Patient Have a Medical Advance Directive? Yes  Type of Estate agent of [place];Living will  Copy of Healthcare Power of Attorney in Chart? No - copy requested   Advance Care Planning is important because it: Ensures you receive medical care that aligns with your values, goals, and preferences. Provides guidance to your family and loved ones, reducing the emotional  burden of decision-making during critical moments.  Vision: Annual vision screenings are recommended for early detection of glaucoma, cataracts, and diabetic retinopathy. These exams can also reveal signs of chronic conditions such as diabetes and high blood pressure.  Dental: Annual dental screenings help detect early signs of oral cancer, gum disease, and other conditions linked to overall health, including heart disease and diabetes.  Please see the attached documents for additional preventive care recommendations.

## 2024-06-26 ENCOUNTER — Ambulatory Visit: Payer: Self-pay | Admitting: Family Medicine

## 2024-06-26 ENCOUNTER — Encounter: Payer: Self-pay | Admitting: Family Medicine

## 2024-06-26 ENCOUNTER — Ambulatory Visit (INDEPENDENT_AMBULATORY_CARE_PROVIDER_SITE_OTHER): Admitting: Family Medicine

## 2024-06-26 VITALS — BP 124/70 | HR 60 | Temp 98.0°F | Wt 166.6 lb

## 2024-06-26 DIAGNOSIS — N401 Enlarged prostate with lower urinary tract symptoms: Secondary | ICD-10-CM | POA: Diagnosis not present

## 2024-06-26 DIAGNOSIS — N138 Other obstructive and reflux uropathy: Secondary | ICD-10-CM

## 2024-06-26 DIAGNOSIS — R739 Hyperglycemia, unspecified: Secondary | ICD-10-CM | POA: Diagnosis not present

## 2024-06-26 DIAGNOSIS — M545 Low back pain, unspecified: Secondary | ICD-10-CM

## 2024-06-26 DIAGNOSIS — Z23 Encounter for immunization: Secondary | ICD-10-CM

## 2024-06-26 DIAGNOSIS — E785 Hyperlipidemia, unspecified: Secondary | ICD-10-CM | POA: Diagnosis not present

## 2024-06-26 DIAGNOSIS — R000 Tachycardia, unspecified: Secondary | ICD-10-CM

## 2024-06-26 DIAGNOSIS — Z136 Encounter for screening for cardiovascular disorders: Secondary | ICD-10-CM

## 2024-06-26 LAB — BASIC METABOLIC PANEL WITH GFR
BUN: 16 mg/dL (ref 6–23)
CO2: 29 meq/L (ref 19–32)
Calcium: 9.5 mg/dL (ref 8.4–10.5)
Chloride: 103 meq/L (ref 96–112)
Creatinine, Ser: 1.18 mg/dL (ref 0.40–1.50)
GFR: 56.22 mL/min — ABNORMAL LOW (ref >60.00)
Glucose, Bld: 91 mg/dL (ref 70–99)
Potassium: 4.7 meq/L (ref 3.5–5.1)
Sodium: 141 meq/L (ref 135–145)

## 2024-06-26 LAB — CBC WITH DIFFERENTIAL/PLATELET
Basophils Absolute: 0.1 K/uL (ref 0.0–0.1)
Basophils Relative: 1.9 % (ref 0.0–3.0)
Eosinophils Absolute: 0.1 K/uL (ref 0.0–0.7)
Eosinophils Relative: 1.3 % (ref 0.0–5.0)
HCT: 40.4 % (ref 39.0–52.0)
Hemoglobin: 13.5 g/dL (ref 13.0–17.0)
Lymphocytes Relative: 40.6 % (ref 12.0–46.0)
Lymphs Abs: 2.2 K/uL (ref 0.7–4.0)
MCHC: 33.5 g/dL (ref 30.0–36.0)
MCV: 93.3 fl (ref 78.0–100.0)
Monocytes Absolute: 0.5 K/uL (ref 0.1–1.0)
Monocytes Relative: 10.1 % (ref 3.0–12.0)
Neutro Abs: 2.5 K/uL (ref 1.4–7.7)
Neutrophils Relative %: 46.1 % (ref 43.0–77.0)
Platelets: 126 K/uL — ABNORMAL LOW (ref 150.0–400.0)
RBC: 4.32 Mil/uL (ref 4.22–5.81)
RDW: 14.5 % (ref 11.5–15.5)
WBC: 5.4 K/uL (ref 4.0–10.5)

## 2024-06-26 LAB — HEPATIC FUNCTION PANEL
ALT: 11 U/L (ref 0–53)
AST: 15 U/L (ref 0–37)
Albumin: 4.6 g/dL (ref 3.5–5.2)
Alkaline Phosphatase: 64 U/L (ref 39–117)
Bilirubin, Direct: 0.1 mg/dL (ref 0.0–0.3)
Total Bilirubin: 0.9 mg/dL (ref 0.2–1.2)
Total Protein: 7.4 g/dL (ref 6.0–8.3)

## 2024-06-26 LAB — LIPID PANEL
Cholesterol: 239 mg/dL — ABNORMAL HIGH (ref 0–200)
HDL: 56.9 mg/dL (ref >39.00)
LDL Cholesterol: 167 mg/dL — ABNORMAL HIGH (ref 0–99)
NonHDL: 182.1
Total CHOL/HDL Ratio: 4
Triglycerides: 78 mg/dL (ref 0.0–149.0)
VLDL: 15.6 mg/dL (ref 0.0–40.0)

## 2024-06-26 LAB — PSA: PSA: 3.6 ng/mL (ref 0.10–4.00)

## 2024-06-26 LAB — HEMOGLOBIN A1C: Hgb A1c MFr Bld: 6 % (ref 4.6–6.5)

## 2024-06-26 LAB — TSH: TSH: 1.28 u[IU]/mL (ref 0.35–5.50)

## 2024-06-26 NOTE — Progress Notes (Signed)
 Subjective:    Patient ID: Peter Chandler, male    DOB: [birth year], 83 y.o.   MRN: [id number]  HPI Here to follow up on issues. He feels well in general. He sometimes has low back pain but he gets good control by taking Tylenol . He has a hx of tachycardia, but he has not felt any racing or fluttering of his heart in many months. He walks for exercise. He does say he is often tired because he takes care of his wife, who has many significant medical problems.    Review of Systems  Constitutional:  Positive for fatigue.  HENT: Negative.    Eyes: Negative.   Respiratory: Negative.    Cardiovascular: Negative.   Gastrointestinal: Negative.   Genitourinary: Negative.   Musculoskeletal:  Positive for back pain.  Skin: Negative.   Neurological: Negative.   Psychiatric/Behavioral: Negative.         Objective:   Physical Exam Constitutional:      General: He is not in acute distress.    Appearance: Normal appearance. He is well-developed. He is not diaphoretic.  HENT:     Head: Normocephalic and atraumatic.     Right Ear: External ear normal.     Left Ear: External ear normal.     Nose: Nose normal.     Mouth/Throat:     Pharynx: No oropharyngeal exudate.  Eyes:     General: No scleral icterus.       Right eye: No discharge.        Left eye: No discharge.     Conjunctiva/sclera: Conjunctivae normal.     Pupils: Pupils are equal, round, and reactive to light.  Neck:     Thyroid : No thyromegaly.     Vascular: No JVD.     Trachea: No tracheal deviation.  Cardiovascular:     Rate and Rhythm: Normal rate and regular rhythm.     Pulses: Normal pulses.     Heart sounds: Normal heart sounds. No murmur heard.    No friction rub. No gallop.  Pulmonary:     Effort: Pulmonary effort is normal. No respiratory distress.     Breath sounds: Normal breath sounds. No wheezing or rales.  Chest:     Chest wall: No tenderness.  Abdominal:     General: Bowel sounds are normal. There is no  distension.     Palpations: Abdomen is soft. There is no mass.     Tenderness: There is no abdominal tenderness. There is no guarding or rebound.  Genitourinary:    Penis: No tenderness.   Musculoskeletal:        General: No tenderness. Normal range of motion.     Cervical back: Neck supple.  Lymphadenopathy:     Cervical: No cervical adenopathy.  Skin:    General: Skin is warm and dry.     Coloration: Skin is not pale.     Findings: No erythema or rash.  Neurological:     General: No focal deficit present.     Mental Status: He is alert and oriented to person, place, and time.     Cranial Nerves: No cranial nerve deficit.     Motor: No abnormal muscle tone.     Coordination: Coordination normal.     Deep Tendon Reflexes: Reflexes are normal and symmetric. Reflexes normal.  Psychiatric:        Mood and Affect: Mood normal.        Behavior: Behavior normal.  Thought Content: Thought content normal.        Judgment: Judgment normal.           Assessment & Plan:  He is doing well in general. He can use heat and Tylenol  as needed for the low back pain. We will send him for lumbar spine Xrays. He has not experienced any tachycardia for quite a while. We will get labs today to follow his lipids, PSA, etc. He would like to do some screening for coronary artery disease, so we will set up a cardiac CT calcium evaluation.We spent a total of (34   ) minutes reviewing records and discussing these issues.  Garnette Olmsted, MD

## 2024-06-27 NOTE — Progress Notes (Signed)
 Spoke with patient regarding results and Dr. Mira recommendations. Questions answered. Patient voiced understanding.

## 2024-06-29 ENCOUNTER — Other Ambulatory Visit

## 2024-06-29 ENCOUNTER — Ambulatory Visit (INDEPENDENT_AMBULATORY_CARE_PROVIDER_SITE_OTHER)

## 2024-06-29 DIAGNOSIS — M4807 Spinal stenosis, lumbosacral region: Secondary | ICD-10-CM | POA: Diagnosis not present

## 2024-06-29 DIAGNOSIS — G8929 Other chronic pain: Secondary | ICD-10-CM | POA: Diagnosis not present

## 2024-06-29 DIAGNOSIS — M545 Low back pain, unspecified: Secondary | ICD-10-CM

## 2024-06-29 DIAGNOSIS — M419 Scoliosis, unspecified: Secondary | ICD-10-CM | POA: Diagnosis not present

## 2024-06-29 DIAGNOSIS — M5126 Other intervertebral disc displacement, lumbar region: Secondary | ICD-10-CM | POA: Diagnosis not present

## 2024-06-29 DIAGNOSIS — M47816 Spondylosis without myelopathy or radiculopathy, lumbar region: Secondary | ICD-10-CM | POA: Diagnosis not present

## 2024-07-02 ENCOUNTER — Ambulatory Visit (HOSPITAL_COMMUNITY)
Admission: RE | Admit: 2024-07-02 | Discharge: 2024-07-02 | Disposition: A | Discharge status: Home / Self Care | Payer: Self-pay | Source: Ambulatory Visit | Attending: Cardiology | Admitting: Cardiology

## 2024-07-02 DIAGNOSIS — Z136 Encounter for screening for cardiovascular disorders: Secondary | ICD-10-CM | POA: Insufficient documentation

## 2024-08-02 DIAGNOSIS — Z961 Presence of intraocular lens: Secondary | ICD-10-CM | POA: Diagnosis not present

## 2024-08-02 DIAGNOSIS — D23121 Other benign neoplasm of skin of left upper eyelid, including canthus: Secondary | ICD-10-CM | POA: Diagnosis not present

## 2024-08-02 DIAGNOSIS — H40021 Open angle with borderline findings, high risk, right eye: Secondary | ICD-10-CM | POA: Diagnosis not present

## 2024-08-02 DIAGNOSIS — H401121 Primary open-angle glaucoma, left eye, mild stage: Secondary | ICD-10-CM | POA: Diagnosis not present

## 2024-08-02 DIAGNOSIS — H4912 Fourth [trochlear] nerve palsy, left eye: Secondary | ICD-10-CM | POA: Diagnosis not present

## 2024-08-20 ENCOUNTER — Ambulatory Visit (INDEPENDENT_AMBULATORY_CARE_PROVIDER_SITE_OTHER): Admitting: Family Medicine

## 2024-08-20 ENCOUNTER — Encounter: Payer: Self-pay | Admitting: Family Medicine

## 2024-08-20 VITALS — BP 118/62 | HR 66 | Temp 98.0°F | Wt 167.4 lb

## 2024-08-20 DIAGNOSIS — M542 Cervicalgia: Secondary | ICD-10-CM

## 2024-08-20 MED ORDER — CYCLOBENZAPRINE HCL 10 MG PO TABS: 10.0000 mg | ORAL_TABLET | Freq: Three times a day (TID) | ORAL | 0 refills | Status: AC | PRN

## 2024-08-20 MED ORDER — DICLOFENAC SODIUM 75 MG PO TBEC
75.0000 mg | DELAYED_RELEASE_TABLET | Freq: Two times a day (BID) | ORAL | 1 refills | Status: AC
Start: 2024-08-20 — End: ?

## 2024-08-20 NOTE — Progress Notes (Signed)
   Subjective:    Patient ID: Peter Chandler, male    DOB: 08/03/1941, 83 y.o.   MRN: [id number]  HPI Here for 6 days of sharp pains and tightness in the right posterior neck. The pain does not go down the arm. No recent trauma. Using heat and Tylenol .    Review of Systems  Constitutional: Negative.   Respiratory: Negative.    Cardiovascular: Negative.   Musculoskeletal:  Positive for neck pain and neck stiffness.       Objective:   Physical Exam Constitutional:      General: He is not in acute distress.    Appearance: Normal appearance.  Cardiovascular:     Rate and Rhythm: Normal rate and regular rhythm.     Pulses: Normal pulses.     Heart sounds: Normal heart sounds.  Pulmonary:     Effort: Pulmonary effort is normal.     Breath sounds: Normal breath sounds.  Musculoskeletal:     Comments: He is mildly tender just to the right of the spine at the C7 area. ROM is reduced in the neck but not by pain   Neurological:     Mental Status: He is alert.           Assessment & Plan:  Neck pain. Treat with Diclofenac 75 mg BID as needed and Flexeril 10 mg TID as needed.  Garnette Olmsted, MD

## 2024-08-27 NOTE — Telephone Encounter (Signed)
  [facility] calling, she said they did not received any update from this request. Provided the information that since the patient has not been seen for more than 3 years to get the medical clearance to the patient's PCP. She understood and requested if they can get it in writing. She requested to fax it to [phone]

## 2024-08-27 NOTE — Telephone Encounter (Signed)
 Notes from preop APP Lum Louis, NP faxed to dental office, see notes.

## 2024-09-19 DIAGNOSIS — Z23 Encounter for immunization: Secondary | ICD-10-CM | POA: Diagnosis not present

## 2024-11-21 ENCOUNTER — Ambulatory Visit: Admitting: General Practice

## 2025-06-26 ENCOUNTER — Ambulatory Visit
